# Patient Record
Sex: Male | Born: 1940 | Race: White | Hispanic: No | Marital: Married | Smoking: Former smoker
Health system: Southern US, Community
[De-identification: ages and names within clinical notes are randomized; demographics above are authoritative.]

## PROBLEM LIST (undated history)

## (undated) ENCOUNTER — Inpatient Hospital Stay: Admission: EM | Payer: Self-pay | Source: Home / Self Care

## (undated) DIAGNOSIS — R233 Spontaneous ecchymoses: Secondary | ICD-10-CM

## (undated) DIAGNOSIS — C801 Malignant (primary) neoplasm, unspecified: Secondary | ICD-10-CM

## (undated) DIAGNOSIS — R011 Cardiac murmur, unspecified: Secondary | ICD-10-CM

## (undated) DIAGNOSIS — E785 Hyperlipidemia, unspecified: Secondary | ICD-10-CM

## (undated) DIAGNOSIS — M1990 Unspecified osteoarthritis, unspecified site: Secondary | ICD-10-CM

## (undated) DIAGNOSIS — E039 Hypothyroidism, unspecified: Secondary | ICD-10-CM

## (undated) DIAGNOSIS — R42 Dizziness and giddiness: Secondary | ICD-10-CM

## (undated) DIAGNOSIS — R339 Retention of urine, unspecified: Secondary | ICD-10-CM

## (undated) DIAGNOSIS — H409 Unspecified glaucoma: Secondary | ICD-10-CM

## (undated) DIAGNOSIS — R404 Transient alteration of awareness: Secondary | ICD-10-CM

## (undated) DIAGNOSIS — N400 Enlarged prostate without lower urinary tract symptoms: Secondary | ICD-10-CM

## (undated) DIAGNOSIS — I10 Essential (primary) hypertension: Secondary | ICD-10-CM

## (undated) DIAGNOSIS — R238 Other skin changes: Secondary | ICD-10-CM

## (undated) DIAGNOSIS — Z8719 Personal history of other diseases of the digestive system: Secondary | ICD-10-CM

## (undated) DIAGNOSIS — K219 Gastro-esophageal reflux disease without esophagitis: Secondary | ICD-10-CM

## (undated) DIAGNOSIS — T8859XA Other complications of anesthesia, initial encounter: Secondary | ICD-10-CM

## (undated) DIAGNOSIS — F419 Anxiety disorder, unspecified: Secondary | ICD-10-CM

## (undated) DIAGNOSIS — T4145XA Adverse effect of unspecified anesthetic, initial encounter: Secondary | ICD-10-CM

## (undated) HISTORY — PX: EYE SURGERY: SHX253

## (undated) HISTORY — PX: ROTATOR CUFF REPAIR: SHX139

## (undated) HISTORY — PX: HERNIA REPAIR: SHX51

## (undated) HISTORY — PX: TONSILLECTOMY: SUR1361

## (undated) HISTORY — PX: BACK SURGERY: SHX140

## (undated) HISTORY — PX: COLONOSCOPY W/ POLYPECTOMY: SHX1380

---

## 1987-05-05 HISTORY — PX: RADICAL NECK DISSECTION: SHX2284

## 1999-02-28 ENCOUNTER — Other Ambulatory Visit: Admission: RE | Admit: 1999-02-28 | Discharge: 1999-02-28 | Payer: Self-pay | Admitting: Urology

## 2001-02-11 ENCOUNTER — Encounter (INDEPENDENT_AMBULATORY_CARE_PROVIDER_SITE_OTHER): Payer: Self-pay | Admitting: Specialist

## 2001-02-11 ENCOUNTER — Inpatient Hospital Stay (HOSPITAL_COMMUNITY): Admission: RE | Admit: 2001-02-11 | Discharge: 2001-02-14 | Payer: Self-pay | Admitting: Urology

## 2007-04-26 ENCOUNTER — Ambulatory Visit (HOSPITAL_COMMUNITY): Admission: RE | Admit: 2007-04-26 | Discharge: 2007-04-28 | Payer: Self-pay | Admitting: General Surgery

## 2008-10-17 ENCOUNTER — Ambulatory Visit (HOSPITAL_COMMUNITY): Admission: RE | Admit: 2008-10-17 | Discharge: 2008-10-18 | Payer: Self-pay | Admitting: Specialist

## 2010-05-08 LAB — COMPREHENSIVE METABOLIC PANEL
ALT: 17 U/L (ref 0–53)
AST: 18 U/L (ref 0–37)
Albumin: 3.8 g/dL (ref 3.5–5.2)
Alkaline Phosphatase: 58 U/L (ref 39–117)
BUN: 11 mg/dL (ref 6–23)
CO2: 29 mEq/L (ref 19–32)
Calcium: 9.7 mg/dL (ref 8.4–10.5)
Chloride: 104 mEq/L (ref 96–112)
Creatinine, Ser: 0.94 mg/dL (ref 0.4–1.5)
GFR calc Af Amer: >60 mL/min (ref >60)
GFR calc non Af Amer: >60 mL/min (ref >60)
Glucose, Bld: 95 mg/dL (ref 70–99)
Potassium: 4 mEq/L (ref 3.5–5.1)
Sodium: 140 mEq/L (ref 135–145)
Total Bilirubin: 0.7 mg/dL (ref 0.3–1.2)
Total Protein: 6.6 g/dL (ref 6.0–8.3)

## 2010-05-08 LAB — CBC
HCT: 41.5 % (ref 39.0–52.0)
Hemoglobin: 14.3 g/dL (ref 13.0–17.0)
MCH: 32.2 pg (ref 26.0–34.0)
MCHC: 34.5 g/dL (ref 30.0–36.0)
MCV: 93.5 fL (ref 78.0–100.0)
Platelets: 203 10*3/uL (ref 150–400)
RBC: 4.44 MIL/uL (ref 4.22–5.81)
RDW: 11.9 % (ref 11.5–15.5)
WBC: 6 10*3/uL (ref 4.0–10.5)

## 2010-05-15 ENCOUNTER — Ambulatory Visit (HOSPITAL_COMMUNITY)
Admission: RE | Admit: 2010-05-15 | Discharge: 2010-05-15 | Payer: Self-pay | Source: Home / Self Care | Attending: Specialist | Admitting: Specialist

## 2010-05-20 ENCOUNTER — Encounter
Admission: RE | Admit: 2010-05-20 | Discharge: 2010-06-03 | Payer: Self-pay | Source: Home / Self Care | Attending: Specialist | Admitting: Specialist

## 2010-05-29 LAB — SURGICAL PCR SCREEN
MRSA, PCR: NEGATIVE
Staphylococcus aureus: NEGATIVE

## 2010-08-11 LAB — CBC
HCT: 43.2 % (ref 39.0–52.0)
Hemoglobin: 14.8 g/dL (ref 13.0–17.0)
MCHC: 34.2 g/dL (ref 30.0–36.0)
MCV: 96.7 fL (ref 78.0–100.0)
Platelets: 195 10*3/uL (ref 150–400)
RBC: 4.47 MIL/uL (ref 4.22–5.81)
RDW: 12.6 % (ref 11.5–15.5)
WBC: 6.2 10*3/uL (ref 4.0–10.5)

## 2010-08-11 LAB — COMPREHENSIVE METABOLIC PANEL
ALT: 15 U/L (ref 0–53)
AST: 18 U/L (ref 0–37)
Albumin: 4 g/dL (ref 3.5–5.2)
Alkaline Phosphatase: 63 U/L (ref 39–117)
BUN: 14 mg/dL (ref 6–23)
CO2: 34 mEq/L — ABNORMAL HIGH (ref 19–32)
Calcium: 9.9 mg/dL (ref 8.4–10.5)
Chloride: 101 mEq/L (ref 96–112)
Creatinine, Ser: 1.1 mg/dL (ref 0.4–1.5)
GFR calc Af Amer: >60 mL/min (ref >60)
GFR calc non Af Amer: >60 mL/min (ref >60)
Glucose, Bld: 103 mg/dL — ABNORMAL HIGH (ref 70–99)
Potassium: 5.1 mEq/L (ref 3.5–5.1)
Sodium: 140 mEq/L (ref 135–145)
Total Bilirubin: 0.7 mg/dL (ref 0.3–1.2)
Total Protein: 6.5 g/dL (ref 6.0–8.3)

## 2010-08-11 LAB — URINALYSIS, ROUTINE W REFLEX MICROSCOPIC
Bilirubin Urine: NEGATIVE
Glucose, UA: NEGATIVE mg/dL
Hgb urine dipstick: NEGATIVE
Ketones, ur: NEGATIVE mg/dL
Nitrite: NEGATIVE
Protein, ur: NEGATIVE mg/dL
Specific Gravity, Urine: 1.008 (ref 1.005–1.030)
Urobilinogen, UA: 0.2 mg/dL (ref 0.0–1.0)
pH: 7.5 (ref 5.0–8.0)

## 2010-08-11 LAB — PROTIME-INR
INR: 1 (ref 0.00–1.49)
Prothrombin Time: 12.9 seconds (ref 11.6–15.2)

## 2010-08-11 LAB — GLUCOSE, CAPILLARY: Glucose-Capillary: 175 mg/dL — ABNORMAL HIGH (ref 70–99)

## 2010-08-11 LAB — APTT: aPTT: 29 seconds (ref 24–37)

## 2010-09-16 NOTE — Op Note (Signed)
[name], [name]             ACCOUNT NO.:  [id number]   MEDICAL RECORD NO.:  [id number]          PATIENT TYPE:  AMB   LOCATION:  SDS                          FACILITY:  [facility]   PHYSICIAN:  Cherylynn Ridges, M.D.    DATE OF BIRTH:  1940/12/22   DATE OF PROCEDURE:  04/26/2007  DATE OF DISCHARGE:                               OPERATIVE REPORT   PREOPERATIVE DIAGNOSIS:  Bilateral inguinal and umbilical hernia.   POSTOPERATIVE DIAGNOSIS:  Bilateral direct inguinal hernias with  umbilical hernia.   PROCEDURE:  Bilateral inguinal hernia repair with mesh and primary  umbilical hernia repair without mesh.   SURGEON:  Dr. Lindie Spruce.   ANESTHESIA:  General endotracheal.   ESTIMATED BLOOD LOSS:  Less than 50 mL.   COMPLICATIONS:  None.   CONDITION:  Stable.  No specimens were sent.   INDICATIONS FOR OPERATION:  The patient is a 70 year old gentleman who  was seen in the office, found to have bilateral inguinal hernias.  Although he was sent over for initial evaluation of a single-sided  inguinal hernia primarily on the left, he was found to have bilateral  inguinal hernias and an umbilical hernia on exam.  He comes in now for  elective repair.   FINDINGS:  The patient's hernias in the inguinal area are both of large  direct hernias.  The umbilical hernia was not incarcerated but was about  4 cm in maximal diameter.   OPERATION:  The patient was taken to the operating room, placed on table  in supine position.  After an adequate endotracheal anesthetic was  administered, he was prepped and draped in usual sterile manner exposing  the entire abdomen down to the pubis.   The first hernia repair was the one on the left side, and it was done  the same as the one on the right.  The operative report is identical to  that on the left side with a few exceptions.  A transverse curvilinear  incision was made with a #10 blade and taken down into the subcutaneous  tissue.  It measured  approximately 7 cm long.  We dissected through the  subcutaneous tissue down to external oblique aponeurosis which was then  dissected free down to sufficient superficial ring.  We split the  external oblique fibers through the superficial ring and then exposed  and mobilized the spermatic cord using a Penrose drain.  It was obvious  that the patient had a large direct sac which was pushing on the lateral  superior aspect of the spermatic cord.  We dissected away the hernia sac  away from the spermatic cord, mobilized the spermatic cord, and then  subsequently sort of folded and sutured the direct sac on itself,  keeping it held down below the inguinal floor using 0 Ethibond sutures.  Once this was done, we reinforced the floor using an oval piece of mesh  measuring 3 x 6 cm in size attaching it to the pubic tubercle with a 0  Prolene suture and anteromedially to the conjoined tendon and  inferolaterally to the reflected portion of the inguinal ligament.  We  sutured all the way out to the internal ring where we tied it upon  itself.  Excess mesh was cut off.  We had soaked it in antibiotic  solution, and then we irrigated with antibiotic solution.  There was  minimal bleeding.  Once we had the mesh in place, we allowed the  spermatic cord to fall back in place along the ilioinguinal nerve which  was preserved.  We made sure not to entrap it with our closure.  We did  close the external oblique fascia on top of the cord using running 3-0  Vicryl stitch, again taking care not to infringe upon or suture the  ilioinguinal nerve.  Once this was done, we closed Scarpa's fascia using  interrupted 3-0 Vicryl suture.  Then, the skin was closed using a  running subcuticular stitch of 4-0 Monocryl.  We injected half percent  Marcaine without epinephrine into the wound prior to closure.   The right side was repaired in a very similar manner.  The only  difference was that only 6 sutures were used to  suture the indirect sac  on itself prior to placing in the oval piece of mesh.  It was injected  with Marcaine and closed in a similar manner.  Sterile dressing was  applied including Dermabond and Steri-Strips and a Tegaderm.   The periumbilical ventral hernia was repaired through a curvilinear  incision horizontally to left on the umbilicus.  We dissected out the  hernia sac to its neck at the fascia where we cut into the sac, reduced  the omentum, ligated the piece of omentum using a 2-0 Vicryl tie and  resected the excess portion.  Once we had placed all of its contents  back into the peritoneal cavity, we used Kocher clamps to tent up on the  fascia.  Then, we closed it primarily using interrupted #1 Novofil  sutures.  After this was done, we ran a stitch back and forth across the  interrupted repair using running #1 Novofil, bringing it from the left  side to the right and then back to the left and tying it to itself.  No  mesh was used.  We irrigated with saline.  Then, we closed with 3-0  Vicryl in the deep layer, making sure to invert the umbilicus, and the  skin was closed using running subcuticular stitch of 4-0 Monocryl.  We  injected the area with Marcaine 0.50% without epinephrine also.  We  applied Steri-Strips, Dermabond and OpSite to that area also.  All  needle, sponge counts and instrument counts were correct at the  conclusion of case.      Cherylynn Ridges, M.D.  Electronically Signed     JOW/MEDQ  D:  04/26/2007  T:  04/27/2007  Job:  [id number]

## 2010-09-16 NOTE — Op Note (Signed)
NAMEFIONN, STRACKE             ACCOUNT NO.:  [id number]   MEDICAL RECORD NO.:  [id number]          PATIENT TYPE:  AMB   LOCATION:  DAY                          FACILITY:  [facility]   PHYSICIAN:  Jene Every, M.D.    DATE OF BIRTH:  12/20/1940   DATE OF PROCEDURE:  10/17/2008  DATE OF DISCHARGE:                               OPERATIVE REPORT   PREOPERATIVE DIAGNOSIS:  Spinal stenosis at L4-5.   POSTOPERATIVE DIAGNOSIS:  Spinal stenosis at L4-5.   PROCEDURE PERFORMED:  1. Bilateral lateral recess decompression foraminotomies of L5 and      decompression lateral recess 4-5.  2. Central laminectomy L4-5.   ANESTHESIA:  General.   ASSISTANT:  Roma Schanz, P.A.   BRIEF HISTORY:  A 70 year old with left lower extremity radicular pain  secondary to multifactorial stenosis.  He is indicated for  decompression, been refractory to conservative treatment.  Risks and  benefits discussed including bleeding, infection damage to neurovascular  structures, no change in symptoms, worsening symptoms, need for repeat  decompression, fusion etc.   TECHNIQUE:  The patient in supine position on operating table after  adequate anesthesia, 1 gram of Kefzol, was placed prone on the Elida  frame.  All bony prominences well padded.  Lumbar region prepped and  draped in the usual sterile fashion.  Two 18 gauge spinal needles  utilized to localize 4-5 interspace confirmed with x-ray.  Incision was  made over spinous process 4 to 5,subcutaneous tissue was dissected.  Electrocautery utilized to achieve hemostasis.  Dorsolumbar fascia  identified, divided in line with skin incision.  Paraspinous muscle  elevated from __________ 4 and 5. McCullough retractor was placed.  Operating microscope was draped, brought on the surgical field after x-  ray confirmed the interlaminar space 4-5 with a lateral x-ray.  Very  small interlaminar window was noted and therefore we used a central  decompression to  preserve the facets.  Partial removal spinous process  of 45 was performed with Leksell rongeur.  Hemilaminotomy of the caudad  edge of 4 was performed with a 2 mm Kerrison and the cephalad edge of 5  as well after detaching ligamentum flavum with straight curette.  Neural  patties were placed beneath the lamina to protect neural elements at all  times.  Ligamentum flavum which was noted be hypertrophic was noted  severe stenosis, left greater than right.  Residual of epidural  injections were noted on the left as well.  We decompressed the lateral  recess to the medial border of pedicle bilaterally and performed  foraminotomies of L5 bilaterally, freeing the L5 nerve root.  We then  passed a hockey stick probe cephalad and caudad to the foramen of 5  being widely patent out the foramen of 4 widely patent.  Nerve root had  at least a centimeter of excursion medial to pedicle without difficulty  bilaterally.  Disk was examined bilaterally.  No disk herniation was  noted.  Wound copiously irrigated.  Inspection revealed no CSF leaks or  active bleeding.  Again pathology noted significantly left greater than  right, predominantly stenosis multifactorial.  We preserved the pars.  Next thrombin-soaked Gelfoam was placed in laminotomy defect.  McCullough retractors removed, irrigated.  Paraspinous muscles inspected  with no active bleeding.  We repaired the dorsolumbar fascia with #1  Vicryl interrupted figure-of-eight sutures, subcu with 2-0 Vicryl simple  sutures.  Skin was reapproximated with staples.  Wound was dressed  sterilely.  Placed supine on hospital bed, extubated without difficulty  transported to the recovery room in satisfactory addition.   The patient tolerated the procedure well with no complications.   Minimal blood loss.      Jene Every, M.D.  Electronically Signed     JB/MEDQ  D:  10/17/2008  T:  10/17/2008  Job:  [id number]

## 2010-09-19 NOTE — H&P (Signed)
[facility]  Patient:    [name], [name] Visit Number: [id number] MRN: [id number]          Service Type: SUR Location: 3W 0351 01 Attending Physician:  Evlyn Clines Dictated by:   Excell Seltzer. Annabell Howells, M.D. Admit Date:  02/11/2001 Discharge Date: 02/14/2001                           History and Physical  CHIEF COMPLAINT:  I cannot pee.  HISTORY OF PRESENT ILLNESS:  [name] is a 70 year old white male with a history of BPH with outlet obstruction, who underwent recent right shoulder surgery and developed postoperative retention and has failed several voiding trials.  He presents now for TURP.  ALLERGIES:  No known drug allergies.  CURRENT MEDICATIONS: 1. Cipro 500 mg 2 x q.d. 2. Nexium 1 p.o. q.a.m. 3. Plendil 10 mg q.d. 4. Synthroid q.d. 5. Flomax 0.4 mg q.d. 6. Darvocet p.r.n.  PAST MEDICAL HISTORY: 1. Hypertension. 2. Mitral valve prolapse. 3. Hypothyroidism. 4. Gastroesophageal reflux disease. 5. History of throat cancer. 6. History of BPH.  PAST SURGICAL HISTORY: 1. Recent right rotator cuff repair. 2. Prior neck resection; removal of neuroma from right front. 3. Blepharoplasty. 4. Chemotherapy and radiation therapy with throat cancer surgery.  SOCIAL HISTORY:  Former smoker.  He drinks occasional beer.  FAMILY HISTORY:  Unremarkable.  REVIEW OF SYSTEMS:  He has some irritation from the catheter.  He has some pain in right shoulder.  He is otherwise without complaints.  PHYSICAL EXAMINATION:  VITAL SIGNS:  Temperature 98.1, pulse 96, respirations 16, and blood pressure 140/70.  GENERAL:  Well-developed, well-nourished in no acute distress.  Alert and oriented x 3.  HEENT:  Normocephalic, atraumatic.  NECK:  Reveals tissue from left neck dissection.  LUNGS:  Clear to auscultation with normal effort.  HEART:  Regular rate and rhythm.  ABDOMEN:  Soft, flat, and nontender without masses or lesions, CVA  tenderness, or hepatosplenomegaly.  No hernias noted.  GU:  Unremarkable phallus with Foley indwelling.  Scrotum unremarkable and testicles bilaterally descended and normal in size and consistency without masses or tenderness.  Epididymides are unremarkable.  No inguinal hernias noted.  Anus and perineum without lesions.  RECTAL:  Normal sphincter tone.  Prostate is 2-3+ in size and benign in consistency without nodules.  Seminal vesicles are not palpable and no rectal masses are noted.  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  PLAN:  TURP. Dictated by:   Excell Seltzer. Annabell Howells, M.D. Attending Physician:  Evlyn Clines DD:  03/08/01 TD:  03/09/01 Job: [id number]

## 2010-09-19 NOTE — Op Note (Signed)
[facility]  Patient:    Derek, Ballard Visit Number: [id number] MRN: [id number]          Service Type: SUR Location: 3W 0351 01 Attending Physician:  Evlyn Clines Proc. Date: 02/11/01 Admit Date:  02/11/2001                             Operative Report  PROCEDURE:  Transurethral resection of the prostate.  PREOPERATIVE DIAGNOSIS:  Benign prostatic hypertrophy with retention.  POSTOPERATIVE DIAGNOSIS:  Benign prostatic hypertrophy with retention.  SURGEON:  Excell Seltzer. Annabell Howells, M.D.  ANESTHESIA:  General.  DRAIN:  22 French three-way Foley catheter.  SPECIMEN:  Prostate tissue.  COMPLICATIONS:  None.  INDICATION:  [name] is a 71 year old white male with a history of BPH with outlet obstruction, who developed retention following right shoulder surgery. He has failed to respond to voiding trials despite maximal Flomax and Urecholine.  He has cystoscopic evidence of obstruction and has elected to undergo TURP.  FINDINGS AT PROCEDURE:  The patient was given p.o. Tequin.  He was taken to the operating room where a general anesthetic was induced.  He was placed in lithotomy position.  His perineum and genitalia were prepped with Betadine solution.  He was draped in the usual sterile fashion.  The 28 French continuous-flow resectoscope sheath was placed without difficulty.  This was fitted with an [place] handle, a 12 degree lens, and a 24 loop.  Resection was initiated at the bladder neck.  The fibers were exposed from 5 to 7 oclock.  The floor of the prostate was resected out alongside the verumontanum.  The right lateral lobe was then resected from bladder neck to apex.  The left lobe was then resected in identical fashion, and the bladder was evacuated free of chips.  Hemostasis was achieved.  He was noted to have some residual apical tissue that was obstructing.  This was resected.  Those chips were removed.  Hemostasis was achieved.   Final inspection revealed no retained chips.  No active bleeding.  The ureteral orifices were intact.  The external sphincter was intact.  The scope was removed.  Pressure on the bladder produced an excellent stream.  A 22 French three-way Foley catheter was inserted with the aide of a catheter guide.  The balloon was filled with 30 cc of sterile fluid.  The catheter was placed on continuous irrigation and was hand-irrigated until clear.  It was then placed on straight drainage.  The specimen was sent for pathology.  He was taken down from lithotomy position. The patients anesthetic was reversed.  He was moved to the recovery room in stable condition.  There were no complications. Attending Physician:  Evlyn Clines DD:  02/11/01 TD:  02/11/01 Job: [id number] [id number]

## 2010-09-19 NOTE — Discharge Summary (Signed)
[facility]  Patient:    [name], [name] Visit Number: [id number] MRN: [id number]          Service Type: SUR Location: 3W 0351 01 Attending Physician:  Evlyn Clines Dictated by:   Excell Seltzer. Annabell Howells, M.D. Admit Date:  02/11/2001 Discharge Date: 02/14/2001                             Discharge Summary  HISTORY OF PRESENT ILLNESS:  Briefly, Mr. [name] is a 70 year old white male who has a history of BPH with outlet obstruction and developed urinary retention following a recent right shoulder surgery.  He has failed several voiding trials and is not without undergo TURP.  For additional details of the history and physical, please see the health history assessment sheet.  HOSPITAL COURSE:  On the day of admission, the patient was taken to the operating room where he underwent a transurethral resection of the prostate. He was left with a 22 [place] three-way Foley catheter.  He tolerated the procedure well.  Postoperatively, he did well.  On the first postoperative day, he was having some bladder spasms, but his urine was clear.  On the second postoperative day, he had a maximum temperature of 100.4 degrees, the Foley was out, and the patient was unable to void.  A 20 French coude catheter was reinserted with return of 570 cc of urine.  On the third postoperative day, he was having some catheter irritation, but he was felt to be ready for discharge home.  His final pathology returned consistent with BPH.  His final diagnosis is urinary retention with a hypotonic bladder.  His admission was complicated by postoperative urinary retention.  DISCHARGE MEDICATIONS: 1. Cipro 500 mg p.o. b.i.d. 2. Urecholine 25 mg q.i.d.  FOLLOW-UP:  He was instructed to follow up with me in the office on February 16, 2001, for another voiding trial.  PROGNOSIS:  Fair.  DISPOSITION:  To home.  CONDITION ON DISCHARGE:  Stable. Dictated by:   Excell Seltzer. Annabell Howells,  M.D. Attending Physician:  Evlyn Clines DD:  03/08/01 TD:  03/09/01 Job: [id number]

## 2010-10-07 ENCOUNTER — Other Ambulatory Visit: Payer: Self-pay | Admitting: Urology

## 2010-10-07 DIAGNOSIS — N281 Cyst of kidney, acquired: Secondary | ICD-10-CM

## 2010-10-13 ENCOUNTER — Ambulatory Visit (HOSPITAL_COMMUNITY)
Admission: RE | Admit: 2010-10-13 | Discharge: 2010-10-13 | Disposition: A | Discharge status: Home / Self Care | Payer: Medicare Other | Source: Ambulatory Visit | Attending: Urology | Admitting: Urology

## 2010-10-13 DIAGNOSIS — N281 Cyst of kidney, acquired: Secondary | ICD-10-CM

## 2010-10-13 DIAGNOSIS — I517 Cardiomegaly: Secondary | ICD-10-CM | POA: Insufficient documentation

## 2010-10-13 DIAGNOSIS — Q619 Cystic kidney disease, unspecified: Secondary | ICD-10-CM | POA: Insufficient documentation

## 2010-10-13 MED ORDER — GADOBENATE DIMEGLUMINE 529 MG/ML IV SOLN
20.0000 mL | Freq: Once | INTRAVENOUS | Status: AC | PRN
  Administered 2010-10-13: 20 mL via INTRAVENOUS

## 2011-02-06 LAB — DIFFERENTIAL
Basophils Absolute: 0
Basophils Relative: 1
Eosinophils Absolute: 0 — ABNORMAL LOW
Eosinophils Relative: 0
Lymphocytes Relative: 11 — ABNORMAL LOW
Lymphs Abs: 0.8
Monocytes Absolute: 0.5
Monocytes Relative: 6
Neutro Abs: 6.3
Neutrophils Relative %: 82 — ABNORMAL HIGH

## 2011-02-06 LAB — COMPREHENSIVE METABOLIC PANEL
ALT: 23
AST: 19
Albumin: 3.9
Alkaline Phosphatase: 61
BUN: 13
CO2: 29
Calcium: 9.4
Chloride: 101
Creatinine, Ser: 0.96
GFR calc Af Amer: >60
GFR calc non Af Amer: >60
Glucose, Bld: 103 — ABNORMAL HIGH
Potassium: 4.5
Sodium: 136
Total Bilirubin: 0.9
Total Protein: 6

## 2011-02-06 LAB — CBC
HCT: 40.9
Hemoglobin: 14.1
MCHC: 34.6
MCV: 95
Platelets: 201
RBC: 4.3
RDW: 12.7
WBC: 7.7

## 2011-08-11 ENCOUNTER — Other Ambulatory Visit: Payer: Self-pay | Admitting: Specialist

## 2011-08-11 DIAGNOSIS — M48061 Spinal stenosis, lumbar region without neurogenic claudication: Secondary | ICD-10-CM

## 2011-08-19 ENCOUNTER — Ambulatory Visit
Admission: RE | Admit: 2011-08-19 | Discharge: 2011-08-19 | Disposition: A | Discharge status: Home / Self Care | Payer: PRIVATE HEALTH INSURANCE | Source: Ambulatory Visit | Attending: Specialist | Admitting: Specialist

## 2011-08-19 VITALS — BP 139/84 | HR 64

## 2011-08-19 DIAGNOSIS — M48061 Spinal stenosis, lumbar region without neurogenic claudication: Secondary | ICD-10-CM

## 2011-08-19 MED ORDER — IOHEXOL 180 MG/ML  SOLN
15.0000 mL | Freq: Once | INTRAMUSCULAR | Status: AC | PRN
  Administered 2011-08-19: 15 mL via INTRAVENOUS

## 2011-08-19 MED ORDER — DIAZEPAM 5 MG PO TABS: 5.0000 mg | ORAL_TABLET | Freq: Once | ORAL | Status: DC

## 2011-08-19 NOTE — Discharge Instructions (Signed)
Myelogram Discharge Instructions  1. Go home and rest quietly for the next 24 hours.  It is important to lie flat for the next 24 hours.  Get up only to go to the restroom.  You may lie in the bed or on a couch on your back, your stomach, your left side or your right side.  You may have one pillow under your head.  You may have pillows between your knees while you are on your side or under your knees while you are on your back.  2. DO NOT drive today.  Recline the seat as far back as it will go, while still wearing your seat belt, on the way home.  3. You may get up to go to the bathroom as needed.  You may sit up for 10 minutes to eat.  You may resume your normal diet and medications unless otherwise indicated.  Drink lots of extra fluids today and tomorrow.  4. The incidence of headache, nausea, or vomiting is about 5% (one in 20 patients).  If you develop a headache, lie flat and drink plenty of fluids until the headache goes away.  Caffeinated beverages may be helpful.  If you develop severe nausea and vomiting or a headache that does not go away with flat bed rest, call [phone].  5. You may resume normal activities after your 24 hours of bed rest is over; however, do not exert yourself strongly or do any heavy lifting tomorrow. If when you get up you have a headache when standing, go back to bed and force fluids for another 24 hours.  6. Call your physician for a follow-up appointment.  The results of your myelogram will be sent directly to your physician by the following day.  7. If you have any questions or if complications develop after you arrive home, please call [phone].  Discharge instructions have been explained to the patient.  The patient, or the person responsible for the patient, fully understands these instructions.       May resume lexapro August 20, 2011, after 9:30 am.

## 2011-08-19 NOTE — Progress Notes (Signed)
Pt states he's been off of lexapro for the past 2 day. dd

## 2012-02-04 ENCOUNTER — Other Ambulatory Visit: Payer: Self-pay | Admitting: Neurosurgery

## 2012-02-25 ENCOUNTER — Encounter (HOSPITAL_COMMUNITY): Payer: Self-pay | Admitting: Pharmacy Technician

## 2012-03-03 NOTE — Pre-Procedure Instructions (Signed)
20 [name]  03/03/2012   Your procedure is scheduled on:  Fri, Nov 8 @ 7:30 AM  Report to [facility] at 5:30 AM.  Call this number if you have problems the morning of surgery: [phone]   Remember:   Do not eat food:After Midnight.    Take these medicines the morning of surgery with A SIP OF WATER: Omeprazole(Prilosec),Synthroid(Levothyroxine),Escitalopram(Lexapro),and Eye Drops   Do not wear jewelry  Do not wear lotions, powders, or colognes. You may wear deodorant.  Men may shave face and neck.  Do not bring valuables to the hospital.  Contacts, dentures or bridgework may not be worn into surgery.  Leave suitcase in the car. After surgery it may be brought to your room.  For patients admitted to the hospital, checkout time is 11:00 AM the day of discharge.   Patients discharged the day of surgery will not be allowed to drive home.    Special Instructions: Shower using CHG 2 nights before surgery and the night before surgery.  If you shower the day of surgery use CHG.  Use special wash - you have one bottle of CHG for all showers.  You should use approximately 1/3 of the bottle for each shower.   Please read over the following fact sheets that you were given: Pain Booklet, Coughing and Deep Breathing, Blood Transfusion Information, MRSA Information and Surgical Site Infection Prevention

## 2012-03-04 ENCOUNTER — Encounter (HOSPITAL_COMMUNITY): Payer: Self-pay

## 2012-03-04 ENCOUNTER — Encounter (HOSPITAL_COMMUNITY)
Admission: RE | Admit: 2012-03-04 | Discharge: 2012-03-04 | Disposition: A | Discharge status: Home / Self Care | Payer: Medicare Other | Source: Ambulatory Visit | Attending: Neurosurgery | Admitting: Neurosurgery

## 2012-03-04 HISTORY — DX: Dizziness and giddiness: R42

## 2012-03-04 HISTORY — DX: Cardiac murmur, unspecified: R01.1

## 2012-03-04 HISTORY — DX: Hypothyroidism, unspecified: E03.9

## 2012-03-04 HISTORY — DX: Personal history of other diseases of the digestive system: Z87.19

## 2012-03-04 HISTORY — DX: Unspecified glaucoma: H40.9

## 2012-03-04 HISTORY — DX: Benign prostatic hyperplasia without lower urinary tract symptoms: N40.0

## 2012-03-04 HISTORY — DX: Anxiety disorder, unspecified: F41.9

## 2012-03-04 HISTORY — DX: Retention of urine, unspecified: R33.9

## 2012-03-04 HISTORY — DX: Hyperlipidemia, unspecified: E78.5

## 2012-03-04 HISTORY — DX: Gastro-esophageal reflux disease without esophagitis: K21.9

## 2012-03-04 HISTORY — DX: Malignant (primary) neoplasm, unspecified: C80.1

## 2012-03-04 HISTORY — DX: Essential (primary) hypertension: I10

## 2012-03-04 LAB — SURGICAL PCR SCREEN
MRSA, PCR: NEGATIVE
Staphylococcus aureus: NEGATIVE

## 2012-03-04 LAB — BASIC METABOLIC PANEL
BUN: 15 mg/dL (ref 6–23)
CO2: 30 mEq/L (ref 19–32)
Chloride: 104 mEq/L (ref 96–112)
Creatinine, Ser: 0.92 mg/dL (ref 0.50–1.35)
GFR calc Af Amer: >90 mL/min (ref >90)
Glucose, Bld: 105 mg/dL — ABNORMAL HIGH (ref 70–99)
Potassium: 4.3 mEq/L (ref 3.5–5.1)

## 2012-03-04 LAB — CBC WITH DIFFERENTIAL/PLATELET
Basophils Relative: 1 % (ref 0–1)
Eosinophils Relative: 2 % (ref 0–5)
HCT: 42.1 % (ref 39.0–52.0)
Hemoglobin: 14.5 g/dL (ref 13.0–17.0)
MCHC: 34.4 g/dL (ref 30.0–36.0)
MCV: 92.5 fL (ref 78.0–100.0)
Monocytes Absolute: 0.4 10*3/uL (ref 0.1–1.0)
Monocytes Relative: 9 % (ref 3–12)
Neutro Abs: 2.7 10*3/uL (ref 1.7–7.7)
RDW: 12 % (ref 11.5–15.5)

## 2012-03-04 LAB — ABO/RH: ABO/RH(D): O NEG

## 2012-03-04 LAB — TYPE AND SCREEN

## 2012-03-04 NOTE — Progress Notes (Signed)
Primary Physician - Dr. Patsey Berthold - [facility] Does not have a cardiologist. Has not had any type of cardiac testing.

## 2012-03-10 MED ORDER — CEFAZOLIN SODIUM-DEXTROSE 2-3 GM-% IV SOLR
2.0000 g | INTRAVENOUS | Status: AC
  Administered 2012-03-11: 2 g via INTRAVENOUS
  Filled 2012-03-10 (×2): qty 50

## 2012-03-11 ENCOUNTER — Inpatient Hospital Stay (HOSPITAL_COMMUNITY)
Admission: RE | Admit: 2012-03-11 | Discharge: 2012-03-15 | DRG: 460 | Disposition: A | Discharge status: Home / Self Care | Payer: Medicare Other | Source: Ambulatory Visit | Attending: Neurosurgery | Admitting: Neurosurgery

## 2012-03-11 ENCOUNTER — Encounter (HOSPITAL_COMMUNITY): Payer: Self-pay | Admitting: Neurosurgery

## 2012-03-11 ENCOUNTER — Encounter (HOSPITAL_COMMUNITY): Payer: Self-pay | Admitting: *Deleted

## 2012-03-11 ENCOUNTER — Inpatient Hospital Stay (HOSPITAL_COMMUNITY): Payer: Medicare Other

## 2012-03-11 ENCOUNTER — Inpatient Hospital Stay (HOSPITAL_COMMUNITY): Payer: Medicare Other | Admitting: Anesthesiology

## 2012-03-11 ENCOUNTER — Encounter (HOSPITAL_COMMUNITY)
Admission: RE | Disposition: A | Discharge status: Home / Self Care | Payer: Self-pay | Source: Ambulatory Visit | Attending: Neurosurgery

## 2012-03-11 ENCOUNTER — Encounter (HOSPITAL_COMMUNITY): Payer: Self-pay | Admitting: Anesthesiology

## 2012-03-11 DIAGNOSIS — Z87891 Personal history of nicotine dependence: Secondary | ICD-10-CM

## 2012-03-11 DIAGNOSIS — E785 Hyperlipidemia, unspecified: Secondary | ICD-10-CM | POA: Diagnosis present

## 2012-03-11 DIAGNOSIS — F411 Generalized anxiety disorder: Secondary | ICD-10-CM | POA: Diagnosis present

## 2012-03-11 DIAGNOSIS — H409 Unspecified glaucoma: Secondary | ICD-10-CM | POA: Diagnosis present

## 2012-03-11 DIAGNOSIS — I10 Essential (primary) hypertension: Secondary | ICD-10-CM | POA: Diagnosis present

## 2012-03-11 DIAGNOSIS — E039 Hypothyroidism, unspecified: Secondary | ICD-10-CM | POA: Diagnosis present

## 2012-03-11 DIAGNOSIS — Z79899 Other long term (current) drug therapy: Secondary | ICD-10-CM

## 2012-03-11 DIAGNOSIS — M48062 Spinal stenosis, lumbar region with neurogenic claudication: Principal | ICD-10-CM | POA: Diagnosis present

## 2012-03-11 DIAGNOSIS — M51379 Other intervertebral disc degeneration, lumbosacral region without mention of lumbar back pain or lower extremity pain: Secondary | ICD-10-CM | POA: Diagnosis present

## 2012-03-11 DIAGNOSIS — Z85819 Personal history of malignant neoplasm of unspecified site of lip, oral cavity, and pharynx: Secondary | ICD-10-CM

## 2012-03-11 DIAGNOSIS — M5137 Other intervertebral disc degeneration, lumbosacral region: Secondary | ICD-10-CM | POA: Diagnosis present

## 2012-03-11 DIAGNOSIS — K219 Gastro-esophageal reflux disease without esophagitis: Secondary | ICD-10-CM | POA: Diagnosis present

## 2012-03-11 DIAGNOSIS — N400 Enlarged prostate without lower urinary tract symptoms: Secondary | ICD-10-CM | POA: Diagnosis present

## 2012-03-11 HISTORY — PX: LUMBAR PERCUTANEOUS PEDICLE SCREW 1 LEVEL: SHX5560

## 2012-03-11 HISTORY — PX: ANTERIOR LAT LUMBAR FUSION: SHX1168

## 2012-03-11 SURGERY — ANTERIOR LATERAL LUMBAR FUSION 1 LEVEL
Anesthesia: General | Site: Back | Laterality: Left | Wound class: Clean

## 2012-03-11 MED ORDER — SODIUM CHLORIDE 0.9 % IR SOLN
Status: DC | PRN
  Administered 2012-03-11: 10:00:00

## 2012-03-11 MED ORDER — CEFAZOLIN SODIUM 1-5 GM-% IV SOLN
1.0000 g | Freq: Three times a day (TID) | INTRAVENOUS | Status: AC
  Administered 2012-03-11 (×2): 1 g via INTRAVENOUS
  Filled 2012-03-11 (×2): qty 50

## 2012-03-11 MED ORDER — ESCITALOPRAM OXALATE 20 MG PO TABS
20.0000 mg | ORAL_TABLET | Freq: Every day | ORAL | Status: DC
  Administered 2012-03-12 – 2012-03-15 (×4): 20 mg via ORAL
  Filled 2012-03-11 (×4): qty 1

## 2012-03-11 MED ORDER — SODIUM CHLORIDE 0.9 % IJ SOLN: 3.0000 mL | INTRAMUSCULAR | Status: DC | PRN

## 2012-03-11 MED ORDER — DEXAMETHASONE SODIUM PHOSPHATE 10 MG/ML IJ SOLN
INTRAMUSCULAR | Status: AC
  Administered 2012-03-11: 10 mg via INTRAVENOUS
  Filled 2012-03-11: qty 1

## 2012-03-11 MED ORDER — OXYCODONE HCL 5 MG PO TABS
5.0000 mg | ORAL_TABLET | Freq: Once | ORAL | Status: AC | PRN
  Administered 2012-03-11: 5 mg via ORAL

## 2012-03-11 MED ORDER — SUCCINYLCHOLINE CHLORIDE 20 MG/ML IJ SOLN
INTRAMUSCULAR | Status: DC | PRN
  Administered 2012-03-11: 120 mg via INTRAVENOUS

## 2012-03-11 MED ORDER — FENTANYL CITRATE 0.05 MG/ML IJ SOLN
INTRAMUSCULAR | Status: DC | PRN
  Administered 2012-03-11: 100 ug via INTRAVENOUS
  Administered 2012-03-11 (×2): 25 ug via INTRAVENOUS

## 2012-03-11 MED ORDER — DIAZEPAM 5 MG PO TABS
5.0000 mg | ORAL_TABLET | Freq: Four times a day (QID) | ORAL | Status: DC | PRN
  Administered 2012-03-11 – 2012-03-13 (×4): 10 mg via ORAL
  Administered 2012-03-13 (×2): 5 mg via ORAL
  Administered 2012-03-14: 10 mg via ORAL
  Administered 2012-03-14: 5 mg via ORAL
  Filled 2012-03-11: qty 2
  Filled 2012-03-11: qty 1
  Filled 2012-03-11 (×4): qty 2
  Filled 2012-03-11: qty 1
  Filled 2012-03-11 (×2): qty 2

## 2012-03-11 MED ORDER — BACITRACIN 50000 UNITS IM SOLR
INTRAMUSCULAR | Status: AC
  Filled 2012-03-11: qty 1

## 2012-03-11 MED ORDER — MIDAZOLAM HCL 5 MG/5ML IJ SOLN
INTRAMUSCULAR | Status: DC | PRN
  Administered 2012-03-11: 2 mg via INTRAVENOUS

## 2012-03-11 MED ORDER — BISACODYL 10 MG RE SUPP: 10.0000 mg | Freq: Every day | RECTAL | Status: DC | PRN

## 2012-03-11 MED ORDER — HYDROMORPHONE HCL PF 1 MG/ML IJ SOLN
INTRAMUSCULAR | Status: AC
  Filled 2012-03-11: qty 1

## 2012-03-11 MED ORDER — 0.9 % SODIUM CHLORIDE (POUR BTL) OPTIME
TOPICAL | Status: DC | PRN
  Administered 2012-03-11: 1000 mL

## 2012-03-11 MED ORDER — ONDANSETRON HCL 4 MG/2ML IJ SOLN: 4.0000 mg | INTRAMUSCULAR | Status: DC | PRN

## 2012-03-11 MED ORDER — SODIUM CHLORIDE 0.9 % IV SOLN: 250.0000 mL | INTRAVENOUS | Status: DC

## 2012-03-11 MED ORDER — ONDANSETRON HCL 4 MG/2ML IJ SOLN: 4.0000 mg | Freq: Four times a day (QID) | INTRAMUSCULAR | Status: DC | PRN

## 2012-03-11 MED ORDER — PHENOL 1.4 % MT LIQD: 1.0000 | OROMUCOSAL | Status: DC | PRN

## 2012-03-11 MED ORDER — ACETAMINOPHEN 325 MG PO TABS: 650.0000 mg | ORAL_TABLET | ORAL | Status: DC | PRN

## 2012-03-11 MED ORDER — TIMOLOL MALEATE 0.5 % OP SOLN
1.0000 [drp] | Freq: Two times a day (BID) | OPHTHALMIC | Status: DC
  Administered 2012-03-11 – 2012-03-15 (×8): 1 [drp] via OPHTHALMIC
  Filled 2012-03-11: qty 5

## 2012-03-11 MED ORDER — ONDANSETRON HCL 4 MG/2ML IJ SOLN
INTRAMUSCULAR | Status: DC | PRN
  Administered 2012-03-11: 4 mg via INTRAVENOUS

## 2012-03-11 MED ORDER — HYDROCODONE-ACETAMINOPHEN 5-325 MG PO TABS
1.0000 | ORAL_TABLET | ORAL | Status: DC | PRN
  Administered 2012-03-11 – 2012-03-14 (×2): 2 via ORAL
  Filled 2012-03-11 (×2): qty 2

## 2012-03-11 MED ORDER — ZOLPIDEM TARTRATE 5 MG PO TABS: 5.0000 mg | ORAL_TABLET | Freq: Every evening | ORAL | Status: DC | PRN

## 2012-03-11 MED ORDER — ALUM & MAG HYDROXIDE-SIMETH 200-200-20 MG/5ML PO SUSP
30.0000 mL | Freq: Four times a day (QID) | ORAL | Status: DC | PRN
  Administered 2012-03-12: 30 mL via ORAL
  Filled 2012-03-11: qty 30

## 2012-03-11 MED ORDER — SODIUM CHLORIDE 0.9 % IV SOLN
INTRAVENOUS | Status: AC
  Filled 2012-03-11: qty 500

## 2012-03-11 MED ORDER — SIMVASTATIN 20 MG PO TABS
20.0000 mg | ORAL_TABLET | Freq: Every evening | ORAL | Status: DC
  Administered 2012-03-11 – 2012-03-14 (×4): 20 mg via ORAL
  Filled 2012-03-11 (×5): qty 1

## 2012-03-11 MED ORDER — PROPOFOL 10 MG/ML IV BOLUS
INTRAVENOUS | Status: DC | PRN
  Administered 2012-03-11: 150 mg via INTRAVENOUS

## 2012-03-11 MED ORDER — DEXAMETHASONE SODIUM PHOSPHATE 10 MG/ML IJ SOLN: 10.0000 mg | INTRAMUSCULAR | Status: DC

## 2012-03-11 MED ORDER — LATANOPROST 0.005 % OP SOLN
1.0000 [drp] | Freq: Every day | OPHTHALMIC | Status: DC
  Administered 2012-03-11 – 2012-03-14 (×4): 1 [drp] via OPHTHALMIC
  Filled 2012-03-11: qty 2.5

## 2012-03-11 MED ORDER — SODIUM CHLORIDE 0.9 % IJ SOLN
3.0000 mL | Freq: Two times a day (BID) | INTRAMUSCULAR | Status: DC
  Administered 2012-03-11 – 2012-03-15 (×7): 3 mL via INTRAVENOUS

## 2012-03-11 MED ORDER — IRBESARTAN 150 MG PO TABS
150.0000 mg | ORAL_TABLET | Freq: Every day | ORAL | Status: DC
  Administered 2012-03-11 – 2012-03-15 (×5): 150 mg via ORAL
  Filled 2012-03-11 (×5): qty 1

## 2012-03-11 MED ORDER — SENNA 8.6 MG PO TABS
1.0000 | ORAL_TABLET | Freq: Two times a day (BID) | ORAL | Status: DC
  Administered 2012-03-11 – 2012-03-15 (×9): 8.6 mg via ORAL
  Filled 2012-03-11 (×11): qty 1

## 2012-03-11 MED ORDER — TRIAMTERENE-HCTZ 37.5-25 MG PO TABS
1.0000 | ORAL_TABLET | Freq: Every day | ORAL | Status: DC
  Administered 2012-03-11 – 2012-03-15 (×5): 1 via ORAL
  Filled 2012-03-11 (×5): qty 1

## 2012-03-11 MED ORDER — OXYCODONE-ACETAMINOPHEN 5-325 MG PO TABS
1.0000 | ORAL_TABLET | ORAL | Status: DC | PRN
  Administered 2012-03-11 – 2012-03-15 (×15): 2 via ORAL
  Filled 2012-03-11 (×15): qty 2

## 2012-03-11 MED ORDER — ACETAMINOPHEN 650 MG RE SUPP: 650.0000 mg | RECTAL | Status: DC | PRN

## 2012-03-11 MED ORDER — OXYCODONE HCL 5 MG PO TABS
ORAL_TABLET | ORAL | Status: AC
  Filled 2012-03-11: qty 1

## 2012-03-11 MED ORDER — LIDOCAINE HCL (CARDIAC) 20 MG/ML IV SOLN
INTRAVENOUS | Status: DC | PRN
  Administered 2012-03-11: 60 mg via INTRAVENOUS

## 2012-03-11 MED ORDER — PANTOPRAZOLE SODIUM 40 MG PO TBEC
40.0000 mg | DELAYED_RELEASE_TABLET | Freq: Every day | ORAL | Status: DC
  Administered 2012-03-12 – 2012-03-15 (×4): 40 mg via ORAL
  Filled 2012-03-11 (×4): qty 1

## 2012-03-11 MED ORDER — OXYCODONE HCL 5 MG/5ML PO SOLN: 5.0000 mg | Freq: Once | ORAL | Status: AC | PRN

## 2012-03-11 MED ORDER — MENTHOL 3 MG MT LOZG: 1.0000 | LOZENGE | OROMUCOSAL | Status: DC | PRN

## 2012-03-11 MED ORDER — BRIMONIDINE TARTRATE-TIMOLOL 0.2-0.5 % OP SOLN: 1.0000 [drp] | Freq: Two times a day (BID) | OPHTHALMIC | Status: DC

## 2012-03-11 MED ORDER — HYDROMORPHONE HCL PF 1 MG/ML IJ SOLN
0.2500 mg | INTRAMUSCULAR | Status: DC | PRN
  Administered 2012-03-11 (×4): 0.5 mg via INTRAVENOUS

## 2012-03-11 MED ORDER — FLEET ENEMA 7-19 GM/118ML RE ENEM: 1.0000 | ENEMA | Freq: Once | RECTAL | Status: AC | PRN

## 2012-03-11 MED ORDER — LACTATED RINGERS IV SOLN
INTRAVENOUS | Status: DC | PRN
  Administered 2012-03-11 (×3): via INTRAVENOUS

## 2012-03-11 MED ORDER — POLYETHYLENE GLYCOL 3350 17 G PO PACK
17.0000 g | PACK | Freq: Every day | ORAL | Status: DC | PRN
  Administered 2012-03-15: 17 g via ORAL
  Filled 2012-03-11: qty 1

## 2012-03-11 MED ORDER — BRIMONIDINE TARTRATE 0.2 % OP SOLN
1.0000 [drp] | Freq: Two times a day (BID) | OPHTHALMIC | Status: DC
  Administered 2012-03-11 – 2012-03-15 (×8): 1 [drp] via OPHTHALMIC
  Filled 2012-03-11: qty 5

## 2012-03-11 MED ORDER — HYDROMORPHONE HCL PF 1 MG/ML IJ SOLN
0.5000 mg | INTRAMUSCULAR | Status: DC | PRN
  Administered 2012-03-12: 1 mg via INTRAVENOUS
  Filled 2012-03-11: qty 1

## 2012-03-11 MED ORDER — LEVOTHYROXINE SODIUM 50 MCG PO TABS
50.0000 ug | ORAL_TABLET | Freq: Every day | ORAL | Status: DC
  Administered 2012-03-12 – 2012-03-15 (×4): 50 ug via ORAL
  Filled 2012-03-11 (×5): qty 1

## 2012-03-11 MED ORDER — EPHEDRINE SULFATE 50 MG/ML IJ SOLN
INTRAMUSCULAR | Status: DC | PRN
  Administered 2012-03-11 (×2): 15 mg via INTRAVENOUS
  Administered 2012-03-11: 10 mg via INTRAVENOUS

## 2012-03-11 SURGICAL SUPPLY — 73 items
ADH SKN CLS APL DERMABOND .7 (GAUZE/BANDAGES/DRESSINGS) ×2
ADH SKN CLS LQ APL DERMABOND (GAUZE/BANDAGES/DRESSINGS) ×1
APL SKNCLS STERI-STRIP NONHPOA (GAUZE/BANDAGES/DRESSINGS) ×1
BAG DECANTER FOR FLEXI CONT (MISCELLANEOUS) ×3 IMPLANT
BENZOIN TINCTURE PRP APPL 2/3 (GAUZE/BANDAGES/DRESSINGS) ×3 IMPLANT
BLADE SURG ROTATE 9660 (MISCELLANEOUS) ×1 IMPLANT
BONE MATRIX OSTEOCEL PLUS 5CC (Bone Implant) ×1 IMPLANT
BRUSH SCRUB EZ PLAIN DRY (MISCELLANEOUS) ×1 IMPLANT
CLOTH BEACON ORANGE TIMEOUT ST (SAFETY) ×3 IMPLANT
CONT SPEC 4OZ CLIKSEAL STRL BL (MISCELLANEOUS) IMPLANT
COVER BACK TABLE 24X17X13 BIG (DRAPES) ×1 IMPLANT
COVER TABLE BACK 60X90 (DRAPES) ×1 IMPLANT
DERMABOND ADHESIVE PROPEN (GAUZE/BANDAGES/DRESSINGS) ×1
DERMABOND ADVANCED (GAUZE/BANDAGES/DRESSINGS) ×2
DERMABOND ADVANCED .7 DNX12 (GAUZE/BANDAGES/DRESSINGS) ×2 IMPLANT
DERMABOND ADVANCED .7 DNX6 (GAUZE/BANDAGES/DRESSINGS) IMPLANT
DRAPE C-ARM 42X72 X-RAY (DRAPES) ×3 IMPLANT
DRAPE C-ARMOR (DRAPES) ×3 IMPLANT
DRAPE LAPAROTOMY 100X72X124 (DRAPES) ×3 IMPLANT
DRAPE POUCH INSTRU U-SHP 10X18 (DRAPES) ×2 IMPLANT
DRAPE SURG 17X23 STRL (DRAPES) ×6 IMPLANT
ELECT BLADE 4.0 EZ CLEAN MEGAD (MISCELLANEOUS)
ELECT REM PT RETURN 9FT ADLT (ELECTROSURGICAL) ×2
ELECTRODE BLDE 4.0 EZ CLN MEGD (MISCELLANEOUS) IMPLANT
ELECTRODE REM PT RTRN 9FT ADLT (ELECTROSURGICAL) ×2 IMPLANT
EVACUATOR 1/8 PVC DRAIN (DRAIN) IMPLANT
GAUZE SPONGE 4X4 16PLY XRAY LF (GAUZE/BANDAGES/DRESSINGS) IMPLANT
GLOVE BIOGEL PI IND STRL 7.0 (GLOVE) IMPLANT
GLOVE BIOGEL PI INDICATOR 7.0 (GLOVE) ×2
GLOVE ECLIPSE 7.5 STRL STRAW (GLOVE) ×1 IMPLANT
GLOVE ECLIPSE 8.5 STRL (GLOVE) ×4 IMPLANT
GLOVE EXAM NITRILE LRG STRL (GLOVE) IMPLANT
GLOVE EXAM NITRILE MD LF STRL (GLOVE) ×1 IMPLANT
GLOVE EXAM NITRILE XL STR (GLOVE) IMPLANT
GLOVE EXAM NITRILE XS STR PU (GLOVE) IMPLANT
GLOVE SS BIOGEL STRL SZ 6.5 (GLOVE) IMPLANT
GLOVE SUPERSENSE BIOGEL SZ 6.5 (GLOVE) ×3
GOWN BRE IMP SLV AUR LG STRL (GOWN DISPOSABLE) ×1 IMPLANT
GOWN BRE IMP SLV AUR XL STRL (GOWN DISPOSABLE) ×5 IMPLANT
GOWN STRL REIN 2XL LVL4 (GOWN DISPOSABLE) IMPLANT
GUIDEWIRE NITINOL BEVEL TIP (WIRE) ×2 IMPLANT
IMPL COROENT LDTXL 10X18X55 (Intraocular Lens) IMPLANT
IMPLANT COROENT LDTXL 10X18X55 (Intraocular Lens) ×2 IMPLANT
KIT BASIN OR (CUSTOM PROCEDURE TRAY) ×3 IMPLANT
KIT DILATOR XLIF 5 (KITS) IMPLANT
KIT MAXCESS (KITS) ×1 IMPLANT
KIT NDL NVM5 EMG ELECT (KITS) IMPLANT
KIT NEEDLE NVM5 EMG ELECT (KITS) ×1 IMPLANT
KIT NEEDLE NVM5 EMG ELECTRODE (KITS) ×1
KIT ROOM TURNOVER OR (KITS) ×3 IMPLANT
KIT XLIF (KITS) ×1
NDL I-PASS III (NEEDLE) IMPLANT
NEEDLE HYPO 22GX1.5 SAFETY (NEEDLE) ×3 IMPLANT
NEEDLE I-PASS III (NEEDLE) ×2 IMPLANT
NS IRRIG 1000ML POUR BTL (IV SOLUTION) ×3 IMPLANT
PACK LAMINECTOMY NEURO (CUSTOM PROCEDURE TRAY) ×3 IMPLANT
PUTTY BONE DBX 5CC MIX (Putty) ×1 IMPLANT
ROD 45MM (Rod) ×1 IMPLANT
SCREW PRECEPT 6.5X40 (Screw) ×2 IMPLANT
SCREW PRECEPT SET (Screw) ×2 IMPLANT
SPONGE GAUZE 4X4 12PLY (GAUZE/BANDAGES/DRESSINGS) ×1 IMPLANT
SPONGE LAP 4X18 X RAY DECT (DISPOSABLE) IMPLANT
SPONGE SURGIFOAM ABS GEL SZ50 (HEMOSTASIS) IMPLANT
STRIP CLOSURE SKIN 1/2X4 (GAUZE/BANDAGES/DRESSINGS) ×2 IMPLANT
SUT VIC AB 2-0 CT1 18 (SUTURE) ×5 IMPLANT
SUT VIC AB 3-0 SH 8-18 (SUTURE) ×5 IMPLANT
SYR 20ML ECCENTRIC (SYRINGE) ×3 IMPLANT
TAPE CLOTH 3X10 TAN LF (GAUZE/BANDAGES/DRESSINGS) ×5 IMPLANT
TAPE CLOTH SURG 4X10 WHT LF (GAUZE/BANDAGES/DRESSINGS) ×1 IMPLANT
TOWEL OR 17X24 6PK STRL BLUE (TOWEL DISPOSABLE) ×3 IMPLANT
TOWEL OR 17X26 10 PK STRL BLUE (TOWEL DISPOSABLE) ×3 IMPLANT
TRAY FOLEY CATH 14FRSI W/METER (CATHETERS) ×2 IMPLANT
WATER STERILE IRR 1000ML POUR (IV SOLUTION) ×3 IMPLANT

## 2012-03-11 NOTE — Anesthesia Postprocedure Evaluation (Signed)
Anesthesia Post Note  Patient: [name]  Procedure(s) Performed: Procedure(s) (LRB): ANTERIOR LATERAL LUMBAR FUSION 1 LEVEL (Left) LUMBAR PERCUTANEOUS PEDICLE SCREW 1 LEVEL (Left)  Anesthesia type: General  Patient location: PACU  Post pain: Pain level controlled and Adequate analgesia  Post assessment: Post-op Vital signs reviewed, Patient's Cardiovascular Status Stable, Respiratory Function Stable, Patent Airway and Pain level controlled  Last Vitals:  Filed Vitals:   03/11/12 1030  BP: 143/73  Pulse:   Temp: 36.4 C  Resp:     Post vital signs: Reviewed and stable  Level of consciousness: awake, alert  and oriented  Complications: No apparent anesthesia complications

## 2012-03-11 NOTE — Preoperative (Signed)
Beta Blockers   Reason not to administer Beta Blockers:Not Applicable 

## 2012-03-11 NOTE — Anesthesia Procedure Notes (Signed)
Procedure Name: Intubation Date/Time: 03/11/2012 8:02 AM Performed by: Rogelia Boga Pre-anesthesia Checklist: Patient identified, Emergency Drugs available, Suction available, Patient being monitored and Timeout performed Patient Re-evaluated:Patient Re-evaluated prior to inductionOxygen Delivery Method: Circle system utilized Preoxygenation: Pre-oxygenation with 100% oxygen Intubation Type: IV induction Ventilation: Mask ventilation without difficulty Laryngoscope Size: Mac and 4 Grade View: Grade II Tube type: Oral Tube size: 7.5 mm Number of attempts: 1 Airway Equipment and Method: Stylet Placement Confirmation: ETT inserted through vocal cords under direct vision,  positive ETCO2 and breath sounds checked- equal and bilateral Secured at: 22 cm Tube secured with: Tape Dental Injury: Teeth and Oropharynx as per pre-operative assessment

## 2012-03-11 NOTE — Progress Notes (Signed)
Utilization review completed.  

## 2012-03-11 NOTE — Brief Op Note (Signed)
03/11/2012  10:06 AM  PATIENT:  [name]  71 y.o. male  PRE-OPERATIVE DIAGNOSIS:  Degenerative disc disease/stenosis/lateral listhesis  POST-OPERATIVE DIAGNOSIS:  Degenerative disc disease/stenosis/lateral listhesis  PROCEDURE:  Procedure(s) (LRB) with comments: ANTERIOR LATERAL LUMBAR FUSION 1 LEVEL (Left) - Left lumbar four-five extreme lumbar interbody fusion with percutaneous pedicle screws  LUMBAR PERCUTANEOUS PEDICLE SCREW 1 LEVEL (Left) - Left lumbar four-five extreme lumbar interbody fusion with percutaneous pedicle screws   SURGEON:  Surgeon(s) and Role:    * Temple Pacini, MD - Primary  PHYSICIAN ASSISTANT:   ASSISTANTS: Kritzer    ANESTHESIA:   general  EBL:  Total I/O In: 2000 [I.V.:2000] Out: 300 [Urine:300]  BLOOD ADMINISTERED:none  DRAINS: none   LOCAL MEDICATIONS USED:  MARCAINE     SPECIMEN:  No Specimen  DISPOSITION OF SPECIMEN:  N/A  COUNTS:  YES  TOURNIQUET:  * No tourniquets in log *  DICTATION: .Dragon Dictation  PLAN OF CARE: Admit to inpatient   PATIENT DISPOSITION:  PACU - hemodynamically stable.   Delay start of Pharmacological VTE agent (>24hrs) due to surgical blood loss or risk of bleeding: yes

## 2012-03-11 NOTE — H&P (Signed)
[name] is an 71 y.o. male.   Chief Complaint: Back pain and left leg pain. HPI: 71 year old male with intractable back pain with radiation to his lower extremities left greater than right. Patient status post previous decompressive surgery with minimal improvement. Patient has been worked up and found to have marked disc space collapse with a lateral listhesis and severe foraminal stenosis. 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 He has failed all efforts at conservative management including epidural steroid injections activity modifications therapy and anti-inflammatory medications. Patient presents now for left-sided L4-5 anterolateral decompression infusion with Perkins pedicle fixation in hopes of improving his symptoms.  Past Medical History  Diagnosis Date  . BPH (benign prostatic hyperplasia)   . Urinary retention   . Hypertension     takes meds daily  . Heart murmur   . Hypothyroidism   . Anxiety   . GERD (gastroesophageal reflux disease)   . H/O hiatal hernia   . Dizziness   . Cancer     throat  . Glaucoma     left worse than right  . Hyperlipemia     Past Surgical History  Procedure Date  . Radical neck dissection 1989  . Rotator cuff repair     right  . Hernia repair   . Back surgery     ruptured disc repair  . Tonsillectomy   . Eye surgery     left cataract    History reviewed. No pertinent family history. Social History:  reports that he has quit smoking. He does not have any smokeless tobacco history on file. He reports that he drinks alcohol. He reports that he does not use illicit drugs.  Allergies:  Allergies  Allergen Reactions  . Codeine Nausea Only    Medications Prior to Admission  Medication Sig Dispense Refill  . brimonidine-timolol (COMBIGAN) 0.2-0.5 % ophthalmic solution Place 1 drop into both eyes every 12 (twelve) hours.      Marland Kitchen escitalopram (LEXAPRO) 20 MG tablet Take 20 mg by mouth daily.      Marland Kitchen latanoprost  (XALATAN) 0.005 % ophthalmic solution Place 1 drop into both eyes at bedtime.      Marland Kitchen levothyroxine (SYNTHROID, LEVOTHROID) 50 MCG tablet Take 50 mcg by mouth daily.      Marland Kitchen omeprazole (PRILOSEC) 20 MG capsule Take 20 mg by mouth daily.      . simvastatin (ZOCOR) 20 MG tablet Take 20 mg by mouth every evening.      . triamterene-hydrochlorothiazide (MAXZIDE-25) 37.5-25 MG per tablet Take 1 tablet by mouth daily.      . valsartan (DIOVAN) 160 MG tablet Take 160 mg by mouth daily.        No results found for this or any previous visit (from the past 48 hour(s)). No results found.  Review of Systems  Constitutional: Negative.   HENT: Negative.   Eyes: Negative.   Respiratory: Negative.   Cardiovascular: Negative.   Gastrointestinal: Negative.   Genitourinary: Negative.   Musculoskeletal: Negative.   Skin: Negative.   Neurological: Negative.   Endo/Heme/Allergies: Negative.   Psychiatric/Behavioral: Negative.     Blood pressure 184/80, pulse 77, temperature 98.5 F (36.9 C), temperature source Oral, resp. rate 20, SpO2 99.00%. Physical Exam  Constitutional: He is oriented to person, place, and time. He appears well-developed and well-nourished.  HENT:  Head: Normocephalic and atraumatic.  Right Ear: External ear normal.  Left Ear: External ear normal.  Nose: Nose normal.  Mouth/Throat: Oropharynx is clear and moist.  Eyes: Conjunctivae normal and EOM are normal. Pupils are equal, round, and reactive to light. Right eye exhibits no discharge. Left eye exhibits no discharge.  Neck: Normal range of motion. Neck supple. No tracheal deviation present. No thyromegaly present.  Cardiovascular: Normal rate, regular rhythm, normal heart sounds and intact distal pulses.   Respiratory: Effort normal and breath sounds normal. No respiratory distress. He has no wheezes.  GI: Soft. Bowel sounds are normal. He exhibits no distension. There is no tenderness.  Musculoskeletal: Normal range of  motion. He exhibits no edema and no tenderness.  Neurological: He is alert and oriented to person, place, and time. He has normal reflexes. He displays normal reflexes. No cranial nerve deficit. Coordination normal.  Skin: Skin is warm and dry. No rash noted. No erythema. No pallor.  Psychiatric: He has a normal mood and affect. His behavior is normal. Judgment and thought content normal.     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 Plan left L4-5 anterior lateral retroperitoneal interbody decompression and fusion with cage instrumentation and morselized allograft coupled with posterior percutaneous pedicle screw fixation. Risks and benefits have been explained. Patient wishes to proceed.  Shakera Ebrahimi A 03/11/2012, 7:51 AM

## 2012-03-11 NOTE — Op Note (Signed)
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   L4-5 posterior percutaneous pedicle fixation  Surgeon:Ricard Faulkner A.Tori Cupps, M.D.  Asst. Surgeon: Gerlene Fee   Anesthesia: General  Indication: 71 year old male status post previous decompressive surgery at L4-5 another physician presents with severe back and lower trimming symptoms failing conservative management her workup demonstrates evidence of marked disc space collapse lateral listhesis and severe foraminal stenosis. Patient's failed conservative management and presents now for decompression and fusion surgery in hopes of improving his symptoms.  Operative note: After induction anesthesia, patient positioned in the right lateral decubitus position and appropriately padded. Fluoroscopy is used to properly position the patient for approach to the L4-5 disc space. Left flank and lumbar region were prepped and draped sterilely. Lateral incision was made between the iliac crest and inferior costal border. Secondary incision was made in the left posterior flank. Utilizing a secondary incision access was gained into the retroperitoneal space. Using a finger within the retroperitoneal space and introducer was passed from the more anterior incision into the psoas muscle on the left side. Utilizing neural monitoring the dilator was then docked into the L4 disc space on the left side. Neural monitoring was used to stimulate to evaluate for adjacent neural structures. Dilators were then passed over the introducer. Stimulation occurred at each step. Self-retaining retractor was placed. Soft tissue retractor was expanded. Using direct  visualization the lateral disc space was explored. No evidence of the lumbar plexus was found. Direct stimulation was then carried out overlying the disc space and the surrounding areas once again no adjacent nerves were identified. A shim was then introduced into the L4-5 disc space. A guidewire was removed. Discectomies and performed by incising the L4-5 disc space and then using various curettes and pituitary rongeurs the disc space was cleaned. Contralateral least as were performed using Cobb elevators. Disc space was sequentially dilated and a 10 mm lordotic cage 55 mm in length and 18 mm in anterior posterior dimension was found to be most appropriate. This cage was then packed with morselized allograft and osteo- cell plus. Slight introducers were then placed within the disc space. The cage was impacted under fluoroscopic guidance and well positioned in both the AP and lateral fluoroscopic planes. Retractor system was removed. Hemostasis be excellent. There were no obvious complications with this aspect of the procedure. The patient's bed was repositioned into a neutral position. Lumbar region was then approached for pedicle screw fixation. 2 separate stab incisions were made over the L4 and L5 pedicles respectively. A Jamshidi needle or use her was then placed into the pedicles of L4 and L5 under fluoroscopic guidance and neural monitoring. Passage was made into the vertebral bodies. Guidewires were placed. The pedicles were then tapped with a 5.5 mm screw tap. 6.5 x 40 mm new invasive screws were then placed into the vertebral bodies of L4 and L5. Once again neural monitoring was used between each step. AP and lateral fraught fluoroscopy demonstrated good positioning of the screws. A 40 50 m rod was then passed in a cephalad screw to the caudal screw. Locking caps and placed over the screw heads. Locking catching and engaged with the construct under some mild compression. Final images revealed good position  of the cage and screw  instrumentation with normal lamina spine. Wounds were then irrigated and closed in typical fashion. Sterile dressings were applied. There were no apparent complications. Patient tolerated the procedure well and returned to recovery room postop.

## 2012-03-11 NOTE — Anesthesia Preprocedure Evaluation (Addendum)
Anesthesia Evaluation  Patient identified by MRN, date of birth, ID band Patient awake    Reviewed: Allergy & Precautions, H&P , NPO status , Patient's Chart, lab work & pertinent test results  Airway Mallampati: II  Neck ROM: full    Dental  (+) Dental Advisory Given   Pulmonary former smoker,          Cardiovascular hypertension, Pt. on medications     Neuro/Psych Anxiety    GI/Hepatic hiatal hernia, GERD-  Medicated and Controlled,  Endo/Other  Hypothyroidism   Renal/GU      Musculoskeletal   Abdominal   Peds  Hematology   Anesthesia Other Findings   Reproductive/Obstetrics                          Anesthesia Physical Anesthesia Plan  ASA: III  Anesthesia Plan: General   Post-op Pain Management:    Induction: Intravenous  Airway Management Planned: Oral ETT  Additional Equipment:   Intra-op Plan:   Post-operative Plan: Extubation in OR  Informed Consent: I have reviewed the patients History and Physical, chart, labs and discussed the procedure including the risks, benefits and alternatives for the proposed anesthesia with the patient or authorized representative who has indicated his/her understanding and acceptance.     Plan Discussed with: CRNA, Surgeon and Anesthesiologist  Anesthesia Plan Comments:        Anesthesia Quick Evaluation

## 2012-03-11 NOTE — Transfer of Care (Signed)
Immediate Anesthesia Transfer of Care Note  Patient: [name]  Procedure(s) Performed: Procedure(s) (LRB) with comments: ANTERIOR LATERAL LUMBAR FUSION 1 LEVEL (Left) - Left lumbar four-five extreme lumbar interbody fusion with percutaneous pedicle screws  LUMBAR PERCUTANEOUS PEDICLE SCREW 1 LEVEL (Left) - Left lumbar four-five extreme lumbar interbody fusion with percutaneous pedicle screws   Patient Location: PACU  Anesthesia Type:General  Level of Consciousness: awake, alert , oriented and patient cooperative  Airway & Oxygen Therapy: Patient Spontanous Breathing and Patient connected to nasal cannula oxygen  Post-op Assessment: Report given to PACU RN, Post -op Vital signs reviewed and stable and Patient moving all extremities X 4  Post vital signs: Reviewed and stable  Complications: No apparent anesthesia complications

## 2012-03-11 NOTE — Progress Notes (Signed)
Orthopedic Tech Progress Note Patient Details:  [name] 05-19-1940 [id number]  Patient ID: [name], male   DOB: 1941-03-10, 71 y.o.   MRN: [id number]   [name] 03/11/2012, 9:09 AM L-S SUPPORT WITH ANT. AND POST. PANELS COMPLETED BY [employer]

## 2012-03-12 NOTE — Progress Notes (Signed)
Subjective: Patient reports Status post X. lift L4-5 day 1. Back pain reasonably well-controlled, patient has concerns about his ability to void having had previous difficulties post anesthesia. Desires to have Foley left in.  Objective: Vital signs in last 24 hours: Temp:  [97.2 F (36.2 C)-98.6 F (37 C)] 97.2 F (36.2 C) (11/09 0940) Pulse Rate:  [74-105] 78  (11/09 0940) Resp:  [18-20] 19  (11/09 0940) BP: (95-157)/(58-85) 104/66 mmHg (11/09 0940) SpO2:  [100 %] 100 % (11/09 0940) Weight:  [83.643 kg (184 lb 6.4 oz)-84.1 kg (185 lb 6.5 oz)] 83.643 kg (184 lb 6.4 oz) (11/08 2000)  Intake/Output from previous day: 11/08 0701 - 11/09 0700 In: 2200 [I.V.:2200] Out: 2375 [Urine:2325; Blood:50] Intake/Output this shift: Total I/O In: -  Out: 600 [Urine:600]  Incisions are clean and dry motor function is good in both lower extremities.  Lab Results: No results found for this basename: WBC:2,HGB:2,HCT:2,PLT:2 in the last 72 hours BMET No results found for this basename: NA:2,K:2,CL:2,CO2:2,GLUCOSE:2,BUN:2,CREATININE:2,CALCIUM:2 in the last 72 hours  Studies/Results: Dg Lumbar Spine 2-3 Views  03/11/2012  *RADIOLOGY REPORT*  Clinical Data: L4-5 X L I F  LUMBAR SPINE - 2-3 VIEW  Comparison: 08/19/2011  Findings: Two C-arm images shows discectomy at L4-5 with interbody fusion material and left-sided pedicle screws and posterior rod.  IMPRESSION: X L I F L4-5 with left-sided pedicle screws and rod.   Original Report Authenticated By: Paulina Fusi, M.D.     Assessment/Plan: Status post X. lift arthrodesis L4-L5 day 1. Concerns for urinary retention.  LOS: 1 day  Leave Foley catheter in. Physical therapy evaluation.   Ladawna Walgren J 03/12/2012, 10:40 AM

## 2012-03-12 NOTE — Evaluation (Signed)
Physical Therapy Evaluation Patient Details Name: KYU CARUTH MRN: [id number] DOB: [birth year] Today's Date: 03/12/2012 Time: 5621-3086 PT Time Calculation (min): 18 min  PT Assessment / Plan / Recommendation Clinical Impression  Pt s/p ALLF L4-5. Pt moving well, slightly limited secondary to pain with mobility. Pt will benefit from skilled PT in the acute care setting in order to maximize functional mobility and safety prior to d/c home with wife    PT Assessment  Patient needs continued PT services    Follow Up Recommendations  No PT follow up;Supervision for mobility/OOB    Does the patient have the potential to tolerate intense rehabilitation      Barriers to Discharge        Equipment Recommendations  Rolling walker with 5" wheels (TBD)    Recommendations for Other Services     Frequency Min 5X/week    Precautions / Restrictions Precautions Precautions: Back Precaution Booklet Issued: Yes (comment) Precaution Comments: pt educated on 3/3 back precautons Required Braces or Orthoses: Spinal Brace Spinal Brace: Lumbar corset;Applied in sitting position Restrictions Weight Bearing Restrictions: No   Pertinent Vitals/Pain Pain 5/10 with bed mobility      Mobility  Bed Mobility Bed Mobility: Rolling Left;Left Sidelying to Sit;Sitting - Scoot to Edge of Bed Rolling Left: 4: Min guard Left Sidelying to Sit: 4: Min guard Sitting - Scoot to [place] of Bed: 4: Min guard Details for Bed Mobility Assistance: VC for proper sequencing to maintain back precautions. Minimal assist for support into sitting Transfers Transfers: Sit to Stand;Stand to Sit Sit to Stand: 5: Supervision Stand to Sit: 5: Supervision;With upper extremity assist;To chair/3-in-1 Details for Transfer Assistance: VC for safe technique to maintain back precautions and avoid bending while standing Ambulation/Gait Ambulation/Gait Assistance: 4: Min assist Ambulation Distance (Feet): 300 Feet Assistive  device: 1 person hand held assist Ambulation/Gait Assistance Details: Min assist with 1 person HHA. Support secondary to pain with mobility. May attempt RW next session for increased comfort and support Gait Pattern: Step-to pattern;Decreased stride length Gait velocity: slow gait speed Stairs: No    Shoulder Instructions     Exercises     PT Diagnosis: Difficulty walking;Acute pain  PT Problem List: Decreased activity tolerance;Decreased mobility;Decreased knowledge of use of DME;Decreased safety awareness;Decreased knowledge of precautions;Pain PT Treatment Interventions: DME instruction;Gait training;Stair training;Functional mobility training;Therapeutic activities;Patient/family education   PT Goals Acute Rehab PT Goals PT Goal Formulation: With patient Time For Goal Achievement: 03/19/12 Potential to Achieve Goals: Good Pt will go Supine/Side to Sit: with modified independence PT Goal: Supine/Side to Sit - Progress: Goal set today Pt will go Sit to Supine/Side: with modified independence PT Goal: Sit to Supine/Side - Progress: Goal set today Pt will go Sit to Stand: with modified independence PT Goal: Sit to Stand - Progress: Goal set today Pt will go Stand to Sit: with modified independence PT Goal: Stand to Sit - Progress: Goal set today Pt will Transfer Bed to Chair/Chair to Bed: with modified independence PT Transfer Goal: Bed to Chair/Chair to Bed - Progress: Goal set today Pt will Ambulate: >150 feet;with modified independence;with least restrictive assistive device PT Goal: Ambulate - Progress: Goal set today Pt will Go Up / Down Stairs: 1-2 stairs;with supervision PT Goal: Up/Down Stairs - Progress: Goal set today  Visit Information  Last PT Received On: 03/12/12 Assistance Needed: +1    Subjective Data  Patient Stated Goal: to be back to normal   Prior Functioning  Home Living Lives With: Spouse  Available Help at Discharge: Family;Available 24  hours/day Type of Home: House Home Access: Stairs to enter [employer] of Steps: 2 Entrance Stairs-Rails: None Home Layout: One level Bathroom Shower/Tub: Walk-in shower;Door [employer] Toilet: Standard Bathroom Accessibility: Yes How Accessible: Accessible via walker Home Adaptive Equipment: Reacher;Built-in shower seat;Hand-held shower hose Prior Function Level of Independence: Independent Able to Take Stairs?: Yes Driving: Yes 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 Dominant Hand: Right    Cognition  Overall Cognitive Status: Appears within functional limits for tasks assessed/performed Arousal/Alertness: Awake/alert Orientation Level: Appears intact for tasks assessed Behavior During Session: [facility] for tasks performed    Extremity/Trunk Assessment Right Upper Extremity Assessment RUE ROM/Strength/Tone: WFL for tasks assessed RUE Sensation: WFL - Light Touch;WFL - Proprioception RUE Coordination: WFL - gross/fine motor Left Upper Extremity Assessment LUE ROM/Strength/Tone: Deficits LUE ROM/Strength/Tone Deficits: from radicle neck surgery Right Lower Extremity Assessment RLE ROM/Strength/Tone: WFL for tasks assessed Left Lower Extremity Assessment LLE ROM/Strength/Tone: WFL for tasks assessed Trunk Assessment Trunk Assessment: Normal   Balance    End of Session PT - End of Session Equipment Utilized During Treatment: Gait belt;Back brace Activity Tolerance: Patient tolerated treatment well Patient left: in chair;with call bell/phone within reach Nurse Communication: Mobility status  GP     Milana Kidney 03/12/2012, 4:48 PM  03/12/2012 Milana Kidney DPT PAGER: [phone] OFFICE: [phone]

## 2012-03-12 NOTE — Progress Notes (Signed)
Occupational Therapy Evaluation Patient Details Name: [name] MRN: [id number] DOB: [birth year] Today's Date: 03/12/2012 Time: 2952-8413 OT Time Calculation (min): 17 min  OT Assessment / Plan / Recommendation Clinical Impression  71 yo s/p lumbar fusion.Lumbar corsett donned in sitting. Completed all education regarding ADL and functional moiblity for ADL while adhereing to back precautions. Written information reviewed and given to pt. Pt demonstrated and verbalized understanding of tasks. Will have 24/7 A at home. Ready for D/C home tomorow.     OT Assessment  Patient does not need any further OT services    Follow Up Recommendations  No OT follow up    Barriers to Discharge      Equipment Recommendations  None recommended by OT    Recommendations for Other Services    Frequency       Precautions / Restrictions Precautions Precautions: Back Precaution Booklet Issued: Yes (comment) Precaution Comments: pt educated on 3/3 back precautons Required Braces or Orthoses: Spinal Brace Spinal Brace: Lumbar corset;Applied in sitting position Restrictions Weight Bearing Restrictions: No   Pertinent Vitals/Pain No c/o pain    ADL  Grooming: Modified independent Where Assessed - Grooming: Unsupported standing Upper Body Bathing: Simulated;Set up Where Assessed - Upper Body Bathing: Unsupported sitting Lower Body Bathing: Minimal assistance Where Assessed - Lower Body Bathing: Unsupported sit to stand Upper Body Dressing: Simulated;Set up Where Assessed - Upper Body Dressing: Unsupported sitting Lower Body Dressing: Minimal assistance Where Assessed - Lower Body Dressing: Unsupported sit to stand Toilet Transfer: Supervision/safety Toilet Transfer Method: Sit to stand;Stand pivot Toilet Transfer Equipment: Comfort height toilet Toileting - Clothing Manipulation and Hygiene: Supervision/safety Where Assessed - Engineer, mining and Hygiene:  Standing Equipment Used: Gait belt;Back brace Transfers/Ambulation Related to ADLs: S. ambulating with S ADL Comments: Educated pt on AE for LB ADL. Pt has all needed AE. Discussed home modification needed to adhere to back precautions. Pt verbalized understanding    OT Diagnosis:    OT Problem List:   OT Treatment Interventions:     OT Goals Acute Rehab OT Goals OT Goal Formulation:  (eval only)  Visit Information  Last OT Received On: 03/12/12 Assistance Needed: +1    Subjective Data      Prior Functioning     Home Living Lives With: Spouse Available Help at Discharge: Family;Available 24 hours/day Type of Home: House Home Access: Stairs to enter [employer] of Steps: 2 Entrance Stairs-Rails: None Home Layout: One level Bathroom Shower/Tub: Walk-in shower;Door [employer] Toilet: Pharmacist, community: Yes How Accessible: Accessible via walker Home Adaptive Equipment: Reacher;Built-in shower seat;Hand-held shower hose Prior Function Level of Independence: Independent Able to Take Stairs?: Yes Driving: Yes 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 Dominant Hand: Right         Vision/Perception     Cognition  Overall Cognitive Status: Appears within functional limits for tasks assessed/performed Arousal/Alertness: Awake/alert Orientation Level: Appears intact for tasks assessed Behavior During Session: [facility] for tasks performed    Extremity/Trunk Assessment Right Upper Extremity Assessment RUE ROM/Strength/Tone: WFL for tasks assessed RUE Sensation: WFL - Light Touch;WFL - Proprioception RUE Coordination: WFL - gross/fine motor Left Upper Extremity Assessment LUE ROM/Strength/Tone: Deficits LUE ROM/Strength/Tone Deficits: from radicle neck surgery Right Lower Extremity Assessment RLE ROM/Strength/Tone: [facility] for tasks assessed Left Lower Extremity Assessment LLE ROM/Strength/Tone: WFL for  tasks assessed Trunk Assessment Trunk Assessment: Normal     Mobility Bed Mobility Bed Mobility: Not assessed Transfers Transfers: Sit to Stand;Stand to Sit  Sit to Stand: 5: Supervision Stand to Sit: 5: Supervision;With upper extremity assist;To chair/3-in-1     Shoulder Instructions     Exercise     Balance  WFL   End of Session OT - End of Session Equipment Utilized During Treatment: Gait belt Activity Tolerance: Patient tolerated treatment well Patient left: in chair;with call bell/phone within reach Nurse Communication: Precautions;Mobility status  GO     Shabree Tebbetts,HILLARY 03/12/2012, 3:12 PM [facility], OTR/L  [phone] 03/12/2012

## 2012-03-13 NOTE — Progress Notes (Signed)
Physical Therapy Treatment Patient Details Name: [name] MRN: [id number] DOB: 31-Oct-1940 Today's Date: 03/13/2012 Time: 4098-1191 PT Time Calculation (min): 18 min  PT Assessment / Plan / Recommendation Comments on Treatment Session  Pt progressing well, although still with episodes requring increased assistance. Will attempt RW next session for increased independence on ambulation and possibly stairs.     Follow Up Recommendations  No PT follow up;Supervision for mobility/OOB     Does the patient have the potential to tolerate intense rehabilitation     Barriers to Discharge        Equipment Recommendations  Rolling walker with 5" wheels    Recommendations for Other Services    Frequency Min 5X/week   Plan Discharge plan remains appropriate;Frequency remains appropriate    Precautions / Restrictions Precautions Precautions: Back Precaution Comments: pt able to verbalize 3/3 back precautions Required Braces or Orthoses: Spinal Brace Spinal Brace: Lumbar corset;Applied in sitting position Restrictions Weight Bearing Restrictions: No   Pertinent Vitals/Pain pain 4/10 with ambulation    Mobility  Bed Mobility Bed Mobility: Rolling Left;Left Sidelying to Sit;Sitting - Scoot to Edge of Bed Rolling Left: 4: Min guard Left Sidelying to Sit: 4: Min guard Sitting - Scoot to [place] of Bed: 4: Min guard Details for Bed Mobility Assistance: VC for proper sequencin to maintain back precautions. No physical assistance although increased time to complete Transfers Transfers: Sit to Stand;Stand to Sit Sit to Stand: 5: Supervision;With upper extremity assist;From bed Stand to Sit: 5: Supervision;With upper extremity assist;To chair/3-in-1 Details for Transfer Assistance: VC for safe technique to maintain back precautions and avoid bending while standing Ambulation/Gait Ambulation/Gait Assistance: 4: Min assist Ambulation Distance (Feet): 400 Feet Assistive device: 1 person  hand held assist Ambulation/Gait Assistance Details: Min assist with 1 person HHA as pt with 2 episodes in which he described his back going out requiring increased assistance to maintain balance. No RW available therefore completed ambulation with HHA. Found RW at end of session and put in room, will attempt next session.  Gait Pattern: Step-to pattern;Decreased stride length Gait velocity: slow gait speed Stairs: Yes Stairs Assistance: 4: Min assist Stairs Assistance Details (indicate cue type and reason): VC for proper sequencing. Min assist for support as pt has no hand rails, may attempt RW next session fr increased independence Stair Management Technique: No rails;Step to pattern;Forwards Number of Stairs: 3     Exercises     PT Diagnosis:    PT Problem List:   PT Treatment Interventions:     PT Goals Acute Rehab PT Goals PT Goal: Supine/Side to Sit - Progress: Progressing toward goal PT Goal: Sit to Supine/Side - Progress: Progressing toward goal PT Goal: Sit to Stand - Progress: Progressing toward goal PT Goal: Stand to Sit - Progress: Progressing toward goal PT Transfer Goal: Bed to Chair/Chair to Bed - Progress: Progressing toward goal PT Goal: Ambulate - Progress: Progressing toward goal PT Goal: Up/Down Stairs - Progress: Progressing toward goal  Visit Information  Last PT Received On: 03/13/12 Assistance Needed: +1    Subjective Data      Cognition  Overall Cognitive Status: Appears within functional limits for tasks assessed/performed Arousal/Alertness: Awake/alert Orientation Level: Appears intact for tasks assessed Behavior During Session: [facility] for tasks performed    Balance     End of Session PT - End of Session Equipment Utilized During Treatment: Gait belt;Back brace Activity Tolerance: Patient tolerated treatment well Patient left: in chair;with call bell/phone within reach Nurse Communication:  Mobility status   GP     Milana Kidney 03/13/2012, 1:26 PM

## 2012-03-13 NOTE — Progress Notes (Signed)
Patient ID: [name], male   DOB: [birth year], 71 y.o.   MRN: [id number] Afeb, VSS Complains of more pain than yesterday, and does not feel comfortable going home. Also, his foley is still in, so we do not know if he can urinate post op yet. Will d/c foley later tonight, and plan on d/c per Dr Jordan Likes tomorrow.

## 2012-03-14 ENCOUNTER — Encounter (HOSPITAL_COMMUNITY): Payer: Self-pay | Admitting: Neurosurgery

## 2012-03-14 NOTE — Progress Notes (Signed)
Physical Therapy Treatment Patient Details Name: [name] MRN: [id number] DOB: 10-24-1940 Today's Date: 03/14/2012 Time: 9147-8295 PT Time Calculation (min): 17 min  PT Assessment / Plan / Recommendation Comments on Treatment Session  Utilized RW for ambulation today, requiring min cues for safe use of RW.  Ambulated at [facility] (A) level.      Follow Up Recommendations  No PT follow up;Supervision for mobility/OOB     Does the patient have the potential to tolerate intense rehabilitation     Barriers to Discharge        Equipment Recommendations  Rolling walker with 5" wheels    Recommendations for Other Services    Frequency Min 5X/week   Plan Discharge plan remains appropriate;Frequency remains appropriate    Precautions / Restrictions Precautions Precautions: Back Precaution Comments: Pt required cuing for "no twisting" Required Braces or Orthoses: Spinal Brace Spinal Brace: Lumbar corset;Applied in sitting position Restrictions Weight Bearing Restrictions: No   Pertinent Vitals/Pain 8/10 back with activity.  RN notified for pain medication.     Mobility  Bed Mobility Bed Mobility: Not assessed Transfers Transfers: Sit to Stand;Stand to Sit Sit to Stand: 5: Supervision;With upper extremity assist;With armrests;From chair/3-in-1 Stand to Sit: 5: Supervision;With upper extremity assist;With armrests;To chair/3-in-1 Details for Transfer Assistance: Supervision for safety Ambulation/Gait Ambulation/Gait Assistance: 4: Min guard Ambulation Distance (Feet): 200 Feet Assistive device: Rolling walker Ambulation/Gait Assistance Details: Cues for safe use of RW, body positioning inside RW, tall posture, increase step/stride length, & hand placement on RW grips.    Gait Pattern: Step-through pattern;Decreased stride length (decreased step height) Gait velocity: slow gait speed Stairs: Yes Stairs Assistance: 4: Min guard Stairs Assistance Details (indicate cue  type and reason): Cues for sequencing & technique.  Used bil rails to mimic holding onto door frame.     Stair Management Technique: Two rails Number of Stairs: 3  Wheelchair Mobility Wheelchair Mobility: No      PT Goals Acute Rehab PT Goals PT Goal Formulation: With patient Time For Goal Achievement: 03/19/12 Potential to Achieve Goals: Good Pt will go Supine/Side to Sit: with modified independence Pt will go Sit to Supine/Side: with modified independence Pt will go Sit to Stand: with modified independence PT Goal: Sit to Stand - Progress: Progressing toward goal Pt will go Stand to Sit: with modified independence PT Goal: Stand to Sit - Progress: Progressing toward goal Pt will Transfer Bed to Chair/Chair to Bed: with modified independence Pt will Ambulate: >150 feet;with modified independence;with least restrictive assistive device PT Goal: Ambulate - Progress: Progressing toward goal Pt will Go Up / Down Stairs: 1-2 stairs;with supervision PT Goal: Up/Down Stairs - Progress: Progressing toward goal  Visit Information  Last PT Received On: 03/14/12 Assistance Needed: +1    Subjective Data      Cognition  Overall Cognitive Status: Appears within functional limits for tasks assessed/performed Arousal/Alertness: Awake/alert Orientation Level: Appears intact for tasks assessed Behavior During Session: [facility] for tasks performed    Balance  Balance Balance Assessed: No  End of Session PT - End of Session Equipment Utilized During Treatment: Gait belt;Back brace Activity Tolerance: Patient tolerated treatment well Patient left: in chair;with call bell/phone within reach;with family/visitor present Nurse Communication: Patient requests pain meds;Mobility status     [name], [place] [phone] 03/14/2012

## 2012-03-15 MED ORDER — HYDROCODONE-ACETAMINOPHEN 5-325 MG PO TABS: 1.0000 | ORAL_TABLET | ORAL | Status: DC | PRN

## 2012-03-15 MED ORDER — METAXALONE 800 MG PO TABS: 800.0000 mg | ORAL_TABLET | Freq: Three times a day (TID) | ORAL | Status: DC

## 2012-03-15 NOTE — Discharge Planning (Signed)
AVn printed D/c instructions given to pt and wife sandra both  Demonstrate understanding by verbalizing 3 out of 3 pack precaution and pt demonstrated using back brace correctly.. Medicated for pain and D/C home . Condition stablei

## 2012-03-15 NOTE — Discharge Summary (Signed)
Physician Discharge Summary  Patient ID: [name] MRN: [id number] DOB/AGE: [birth year] 71 y.o.  Admit date: 03/11/2012 Discharge date: 03/15/2012  Admission Diagnoses:  Discharge Diagnoses:  Principal Problem:  *Lumbar stenosis with neurogenic claudication   Discharged Condition: good  Hospital Course: Admitted the hospital where he underwent uncomplicated L4-5 decompression and fusion. Postoperatively he is much improved. Back in r pain has essentially resolved. Strength station are normal. His wounds are healing well. He is tolerating regular diet and voiding without difficulty. He feels ready for discharge home.  Consults:   Significant Diagnostic Studies:   Treatments:   Discharge Exam: Blood pressure 115/68, pulse 68, temperature 98.5 F (36.9 C), temperature source Oral, resp. rate 18, height 5\' 9"  (1.753 m), weight 83.643 kg (184 lb 6.4 oz), SpO2 99.00%. He is awake and alert oriented and appropriate her cranial nerve function is intact. Motor and sensory function extremities is normal. Wound clean dry and intact. Chest and abdomen benign.  Disposition:      Medication List     As of 03/15/2012 10:45 AM    TAKE these medications         COMBIGAN 0.2-0.5 % ophthalmic solution   Generic drug: brimonidine-timolol   Place 1 drop into both eyes every 12 (twelve) hours.      escitalopram 20 MG tablet   Commonly known as: LEXAPRO   Take 20 mg by mouth daily.      HYDROcodone-acetaminophen 5-325 MG per tablet   Commonly known as: NORCO/VICODIN   Take 1-2 tablets by mouth every 4 (four) hours as needed.      latanoprost 0.005 % ophthalmic solution   Commonly known as: XALATAN   Place 1 drop into both eyes at bedtime.      levothyroxine 50 MCG tablet   Commonly known as: SYNTHROID, LEVOTHROID   Take 50 mcg by mouth daily.      metaxalone 800 MG tablet   Commonly known as: SKELAXIN   Take 1 tablet (800 mg total) by mouth 3 (three) times daily.     omeprazole 20 MG capsule   Commonly known as: PRILOSEC   Take 20 mg by mouth daily.      simvastatin 20 MG tablet   Commonly known as: ZOCOR   Take 20 mg by mouth every evening.      triamterene-hydrochlorothiazide 37.5-25 MG per tablet   Commonly known as: MAXZIDE-25   Take 1 tablet by mouth daily.      valsartan 160 MG tablet   Commonly known as: DIOVAN   Take 160 mg by mouth daily.           Follow-up Information    Follow up with [name], MD. Call in 1 week. (Ask for [name])    Contact information:   [address] [place] [address] [phone]          Signed: Jalessa Peyser A 03/15/2012, 10:45 AM

## 2012-03-15 NOTE — Progress Notes (Signed)
Physical Therapy Treatment Patient Details Name: [name] MRN: [id number] DOB: [birth year] Today's Date: 03/15/2012 Time: 9147-8295 PT Time Calculation (min): 21 min  PT Assessment / Plan / Recommendation Comments on Treatment Session  Pt. ambulated with RW today, min guard progressing to supervision in hallway. Pt. still presents with some decreased safety awareness & requiring cueing to reinforce back precautions with mobility.  Pt may benefit from safety evaluation/HHPT in the home for further assessment in the home. Pt. stated his wife just had surgery and will be able to tell him what to do but unable to provide limited physical (A). Will discuss changes for D/C with a PT.    Follow Up Recommendations  Home health PT     Does the patient have the potential to tolerate intense rehabilitation     Barriers to Discharge        Equipment Recommendations  Rolling walker with 5" wheels    Recommendations for Other Services    Frequency Min 5X/week   Plan Discharge plan needs to be updated    Precautions / Restrictions Precautions Precautions: Back Precaution Comments: Pt required cuing for "no twisting" Required Braces or Orthoses: Spinal Brace Spinal Brace: Lumbar corset;Applied in sitting position Restrictions Weight Bearing Restrictions: No   Pertinent Vitals/Pain Patient did not report of any pain during PT session    Mobility  Bed Mobility Bed Mobility: Rolling Left;Left Sidelying to Sit;Sitting - Scoot to [place] of Bed;Sit to Sidelying Left Rolling Left: 5: Supervision Left Sidelying to Sit: 4: Min guard;HOB flat Sitting - Scoot to Edge of Bed: 4: Min guard Sit to Sidelying Left: 4: Min guard;HOB flat Details for Bed Mobility Assistance: Min guard for safety and guarding while pt. getting to sitting position at EOB. Pt. given VC for proper technique with bed mobility and still presents with some signs of poor safety awareness with back precautions (sit>supine  without logrolling). Education on why log rolling is appropriate method. VC for rolling as one unit vs. segmentally. Transfers Transfers: Sit to Stand;Stand to Sit Sit to Stand: 5: Supervision;With upper extremity assist;From bed;From chair/3-in-1;With armrests Stand to Sit: 5: Supervision;With upper extremity assist;To bed;To chair/3-in-1;With armrests Details for Transfer Assistance: VC for not standing without RW in front of him. Pt. supervision with standing for safety and given VC for proper hand placement during standing to and sitting from RW. Pt. educated in following the back precautions during transfers. Ambulation/Gait Ambulation/Gait Assistance: 4: Min guard;5: Supervision Ambulation Distance (Feet): 500 Feet (500+) Assistive device: Rolling walker Ambulation/Gait Assistance Details: Cues for using RW safely and to stay close to RW. Min guard for safety and supervision at times on clear hallways without congestion/obstacles.  Gait Pattern: Step-through pattern;Decreased stride length Gait velocity: slow gait speed Stairs: Yes Stairs Assistance: 5: Supervision Stairs Assistance Details (indicate cue type and reason): Supervision for safety but pt able to return demonstration of technique from previous PT session. Both rails used to imitate door frame in the patients home. Stair Management Technique: Two rails Number of Stairs: 3  Wheelchair Mobility Wheelchair Mobility: No      PT Goals Acute Rehab PT Goals PT Goal Formulation: With patient Time For Goal Achievement: 03/19/12 Potential to Achieve Goals: Good Pt will go Supine/Side to Sit: with modified independence PT Goal: Supine/Side to Sit - Progress: Progressing toward goal Pt will go Sit to Supine/Side: with modified independence PT Goal: Sit to Supine/Side - Progress: Not met Pt will go Sit to Stand: with modified independence PT  Goal: Sit to Stand - Progress: Not met Pt will go Stand to Sit: with modified  independence PT Goal: Stand to Sit - Progress: Not met Pt will Transfer Bed to Chair/Chair to Bed: with modified independence Pt will Ambulate: >150 feet;with modified independence;with least restrictive assistive device PT Goal: Ambulate - Progress: Progressing toward goal Pt will Go Up / Down Stairs: 1-2 stairs;with supervision PT Goal: Up/Down Stairs - Progress: Progressing toward goal  Visit Information  Last PT Received On: 03/15/12 Assistance Needed: +1    Subjective Data  Subjective: "I'm feeling fine" Patient Stated Goal: to be back to normal   Cognition  Overall Cognitive Status: Appears within functional limits for tasks assessed/performed Arousal/Alertness: Awake/alert Orientation Level: Appears intact for tasks assessed Behavior During Session: [facility] for tasks performed    Balance  Balance Balance Assessed: No  End of Session PT - End of Session Equipment Utilized During Treatment: Gait belt;Back brace Activity Tolerance: Patient tolerated treatment well Patient left: in chair;with call bell/phone within reach Nurse Communication: Mobility status    Mertie Clause, SPTA 03/15/2012, 12:08 PM

## 2012-03-15 NOTE — Care Management Note (Signed)
    Page 1 of 1   03/15/2012     12:42:56 PM   CARE MANAGEMENT NOTE 03/15/2012  Patient:  [name], [name]   Account Number:  [id number]  Date Initiated:  03/15/2012  Documentation initiated by:  [facility]  Subjective/Objective Assessment:   Admitted postop L4-5 xlif.Lives with spouse.     Action/Plan:   PT/OT evals-recommended rolling walker   Anticipated DC Date:  03/15/2012   Anticipated DC Plan:  HOME/SELF CARE      DC Planning Services  CM consult      Choice offered to / List presented to:     DME arranged  Levan Hurst      DME agency  [facility]        Status of service:  Completed, signed off Medicare Important Message given?   (If response is "NO", the following Medicare IM given date fields will be blank) Date Medicare IM given:   Date Additional Medicare IM given:    Discharge Disposition:  HOME/SELF CARE  Per UR Regulation:  Reviewed for med. necessity/level of care/duration of stay  If discussed at Long Length of Stay Meetings, dates discussed:    Comments:  03/15/12 Spoke with patient about equipment needs, he has a 3N1 but will need a rolling walker. Rolling walker delivered to patient's room prior to discharge.Jacquelynn Cree RN, BSN, CCM

## 2012-04-06 NOTE — Progress Notes (Signed)
Physical Therapy Treatment Patient Details Name: [name] MRN: [id number] DOB: [birth year] Today's Date: 03/15/2012 Time: 9147-8295 PT Time Calculation (min): 21 min  PT Assessment / Plan / Recommendation Comments on Treatment Session  Pt. ambulated with RW today, min guard progressing to supervision in hallway. Pt. still presents with some decreased safety awareness & requiring cueing to reinforce back precautions with mobility.  Pt may benefit from safety evaluation/HHPT in the home for further assessment in the home. Pt. stated his wife just had surgery and will be able to tell him what to do but unable to provide limited physical (A). Will discuss changes for D/C with a PT.    Follow Up Recommendations  Home health PT     Does the patient have the potential to tolerate intense rehabilitation     Barriers to Discharge        Equipment Recommendations  Rolling walker with 5" wheels    Recommendations for Other Services    Frequency Min 5X/week   Plan Discharge plan needs to be updated    Precautions / Restrictions Precautions Precautions: Back Precaution Comments: Pt required cuing for "no twisting" Required Braces or Orthoses: Spinal Brace Spinal Brace: Lumbar corset;Applied in sitting position Restrictions Weight Bearing Restrictions: No   Pertinent Vitals/Pain Patient did not report of any pain during PT session    Mobility  Bed Mobility Bed Mobility: Rolling Left;Left Sidelying to Sit;Sitting - Scoot to [place] of Bed;Sit to Sidelying Left Rolling Left: 5: Supervision Left Sidelying to Sit: 4: Min guard;HOB flat Sitting - Scoot to Edge of Bed: 4: Min guard Sit to Sidelying Left: 4: Min guard;HOB flat Details for Bed Mobility Assistance: Min guard for safety and guarding while pt. getting to sitting position at EOB. Pt. given VC for proper technique with bed mobility and still presents with some signs of poor safety awareness with back precautions (sit>supine  without logrolling). Education on why log rolling is appropriate method. VC for rolling as one unit vs. segmentally. Transfers Transfers: Sit to Stand;Stand to Sit Sit to Stand: 5: Supervision;With upper extremity assist;From bed;From chair/3-in-1;With armrests Stand to Sit: 5: Supervision;With upper extremity assist;To bed;To chair/3-in-1;With armrests Details for Transfer Assistance: VC for not standing without RW in front of him. Pt. supervision with standing for safety and given VC for proper hand placement during standing to and sitting from RW. Pt. educated in following the back precautions during transfers. Ambulation/Gait Ambulation/Gait Assistance: 4: Min guard;5: Supervision Ambulation Distance (Feet): 500 Feet (500+) Assistive device: Rolling walker Ambulation/Gait Assistance Details: Cues for using RW safely and to stay close to RW. Min guard for safety and supervision at times on clear hallways without congestion/obstacles.  Gait Pattern: Step-through pattern;Decreased stride length Gait velocity: slow gait speed Stairs: Yes Stairs Assistance: 5: Supervision Stairs Assistance Details (indicate cue type and reason): Supervision for safety but pt able to return demonstration of technique from previous PT session. Both rails used to imitate door frame in the patients home. Stair Management Technique: Two rails Number of Stairs: 3  Wheelchair Mobility Wheelchair Mobility: No      PT Goals Acute Rehab PT Goals PT Goal Formulation: With patient Time For Goal Achievement: 03/19/12 Potential to Achieve Goals: Good Pt will go Supine/Side to Sit: with modified independence PT Goal: Supine/Side to Sit - Progress: Progressing toward goal Pt will go Sit to Supine/Side: with modified independence PT Goal: Sit to Supine/Side - Progress: Not met Pt will go Sit to Stand: with modified independence PT  Goal: Sit to Stand - Progress: Not met Pt will go Stand to Sit: with modified  independence PT Goal: Stand to Sit - Progress: Not met Pt will Transfer Bed to Chair/Chair to Bed: with modified independence Pt will Ambulate: >150 feet;with modified independence;with least restrictive assistive device PT Goal: Ambulate - Progress: Progressing toward goal Pt will Go Up / Down Stairs: 1-2 stairs;with supervision PT Goal: Up/Down Stairs - Progress: Progressing toward goal  Visit Information  Last PT Received On: 03/15/12 Assistance Needed: +1    Subjective Data  Subjective: "I'm feeling fine" Patient Stated Goal: to be back to normal   Cognition  Overall Cognitive Status: Appears within functional limits for tasks assessed/performed Arousal/Alertness: Awake/alert Orientation Level: Appears intact for tasks assessed Behavior During Session: [facility] for tasks performed    Balance  Balance Balance Assessed: No  End of Session PT - End of Session Equipment Utilized During Treatment: Gait belt;Back brace Activity Tolerance: Patient tolerated treatment well Patient left: in chair;with call bell/phone within reach Nurse Communication: Mobility status    Mertie Clause, SPTA 03/15/2012, 12:08 PM  Agree with above assessment.  Lewis Shock, PT, DPT Pager #: [phone] Office #: [phone]

## 2014-05-01 ENCOUNTER — Ambulatory Visit (INDEPENDENT_AMBULATORY_CARE_PROVIDER_SITE_OTHER): Payer: Self-pay | Admitting: General Surgery

## 2014-05-17 ENCOUNTER — Encounter (HOSPITAL_COMMUNITY)
Admission: RE | Admit: 2014-05-17 | Discharge: 2014-05-17 | Disposition: A | Discharge status: Home / Self Care | Payer: Medicare HMO | Source: Ambulatory Visit | Attending: General Surgery | Admitting: General Surgery

## 2014-05-17 ENCOUNTER — Encounter (HOSPITAL_COMMUNITY): Payer: Self-pay

## 2014-05-17 DIAGNOSIS — K4091 Unilateral inguinal hernia, without obstruction or gangrene, recurrent: Secondary | ICD-10-CM | POA: Diagnosis not present

## 2014-05-17 DIAGNOSIS — Z01812 Encounter for preprocedural laboratory examination: Secondary | ICD-10-CM | POA: Diagnosis present

## 2014-05-17 DIAGNOSIS — Z01810 Encounter for preprocedural cardiovascular examination: Secondary | ICD-10-CM | POA: Diagnosis not present

## 2014-05-17 DIAGNOSIS — Z01818 Encounter for other preprocedural examination: Secondary | ICD-10-CM | POA: Insufficient documentation

## 2014-05-17 HISTORY — DX: Spontaneous ecchymoses: R23.3

## 2014-05-17 HISTORY — DX: Other skin changes: R23.8

## 2014-05-17 HISTORY — DX: Other complications of anesthesia, initial encounter: T88.59XA

## 2014-05-17 HISTORY — DX: Adverse effect of unspecified anesthetic, initial encounter: T41.45XA

## 2014-05-17 LAB — CBC WITH DIFFERENTIAL/PLATELET
BASOS PCT: 1 % (ref 0–1)
Basophils Absolute: 0 10*3/uL (ref 0.0–0.1)
EOS PCT: 3 % (ref 0–5)
Eosinophils Absolute: 0.1 10*3/uL (ref 0.0–0.7)
HCT: 40.9 % (ref 39.0–52.0)
HEMOGLOBIN: 14.1 g/dL (ref 13.0–17.0)
LYMPHS ABS: 0.7 10*3/uL (ref 0.7–4.0)
LYMPHS PCT: 16 % (ref 12–46)
MCH: 32.2 pg (ref 26.0–34.0)
MCHC: 34.5 g/dL (ref 30.0–36.0)
MCV: 93.4 fL (ref 78.0–100.0)
MONOS PCT: 6 % (ref 3–12)
Monocytes Absolute: 0.3 10*3/uL (ref 0.1–1.0)
NEUTROS ABS: 3.4 10*3/uL (ref 1.7–7.7)
Neutrophils Relative %: 74 % (ref 43–77)
Platelets: 169 10*3/uL (ref 150–400)
RBC: 4.38 MIL/uL (ref 4.22–5.81)
RDW: 12.1 % (ref 11.5–15.5)
WBC: 4.6 10*3/uL (ref 4.0–10.5)

## 2014-05-17 LAB — COMPREHENSIVE METABOLIC PANEL
ALBUMIN: 3.8 g/dL (ref 3.5–5.2)
ALT: 15 U/L (ref 0–53)
AST: 22 U/L (ref 0–37)
Alkaline Phosphatase: 66 U/L (ref 39–117)
Anion gap: 13 (ref 5–15)
BUN: 10 mg/dL (ref 6–23)
CALCIUM: 9.4 mg/dL (ref 8.4–10.5)
CHLORIDE: 100 meq/L (ref 96–112)
CO2: 30 mmol/L (ref 19–32)
CREATININE: 1.11 mg/dL (ref 0.50–1.35)
GFR calc Af Amer: 74 mL/min — ABNORMAL LOW (ref >90)
GFR calc non Af Amer: 64 mL/min — ABNORMAL LOW (ref >90)
GLUCOSE: 138 mg/dL — AB (ref 70–99)
POTASSIUM: 3.5 mmol/L (ref 3.5–5.1)
Sodium: 143 mmol/L (ref 135–145)
Total Bilirubin: 0.9 mg/dL (ref 0.3–1.2)
Total Protein: 6.2 g/dL (ref 6.0–8.3)

## 2014-05-21 MED ORDER — CEFAZOLIN SODIUM-DEXTROSE 2-3 GM-% IV SOLR
2.0000 g | INTRAVENOUS | Status: AC
  Administered 2014-05-22: 2 g via INTRAVENOUS
  Filled 2014-05-21: qty 50

## 2014-05-21 NOTE — Anesthesia Preprocedure Evaluation (Addendum)
Anesthesia Evaluation  Patient identified by MRN, date of birth, ID band Patient awake    Reviewed: Allergy & Precautions, NPO status , Patient's Chart, lab work & pertinent test results  Airway Mallampati: II  TM Distance: >3 FB Neck ROM: Full    Dental  (+) Teeth Intact, Dental Advisory Given, Caps   Pulmonary former smoker,  breath sounds clear to auscultation        Cardiovascular hypertension, Pt. on medications Rhythm:Regular  EKG ST changes no change since 2013; Pt has a catapres patch on shoulder.   Neuro/Psych Anxiety    GI/Hepatic GERD-  Medicated,  Endo/Other    Renal/GU      Musculoskeletal   Abdominal (+)  Abdomen: soft.    Peds  Hematology   Anesthesia Other Findings Caps and bridgework across front, multi unit  Reproductive/Obstetrics                          Anesthesia Physical Anesthesia Plan  ASA: III  Anesthesia Plan: General   Post-op Pain Management:    Induction: Intravenous  Airway Management Planned: Oral ETT  Additional Equipment:   Intra-op Plan:   Post-operative Plan: Extubation in OR  Informed Consent: I have reviewed the patients History and Physical, chart, labs and discussed the procedure including the risks, benefits and alternatives for the proposed anesthesia with the patient or authorized representative who has indicated his/her understanding and acceptance.     Plan Discussed with:   Anesthesia Plan Comments: (Will offer TAP block if time permits)        Anesthesia Quick Evaluation

## 2014-05-22 ENCOUNTER — Ambulatory Visit (HOSPITAL_COMMUNITY): Payer: Medicare HMO | Admitting: Anesthesiology

## 2014-05-22 ENCOUNTER — Encounter (HOSPITAL_COMMUNITY): Payer: Self-pay | Admitting: *Deleted

## 2014-05-22 ENCOUNTER — Encounter (HOSPITAL_COMMUNITY)
Admission: RE | Disposition: A | Discharge status: Home / Self Care | Payer: Self-pay | Source: Ambulatory Visit | Attending: General Surgery

## 2014-05-22 ENCOUNTER — Ambulatory Visit (HOSPITAL_COMMUNITY)
Admission: RE | Admit: 2014-05-22 | Discharge: 2014-05-22 | Disposition: A | Discharge status: Home / Self Care | Payer: Medicare HMO | Source: Ambulatory Visit | Attending: General Surgery | Admitting: General Surgery

## 2014-05-22 DIAGNOSIS — K4091 Unilateral inguinal hernia, without obstruction or gangrene, recurrent: Secondary | ICD-10-CM | POA: Insufficient documentation

## 2014-05-22 DIAGNOSIS — Z87891 Personal history of nicotine dependence: Secondary | ICD-10-CM | POA: Diagnosis not present

## 2014-05-22 DIAGNOSIS — I10 Essential (primary) hypertension: Secondary | ICD-10-CM | POA: Diagnosis not present

## 2014-05-22 DIAGNOSIS — Z79899 Other long term (current) drug therapy: Secondary | ICD-10-CM | POA: Diagnosis not present

## 2014-05-22 DIAGNOSIS — F419 Anxiety disorder, unspecified: Secondary | ICD-10-CM | POA: Insufficient documentation

## 2014-05-22 DIAGNOSIS — Z885 Allergy status to narcotic agent status: Secondary | ICD-10-CM | POA: Diagnosis not present

## 2014-05-22 HISTORY — PX: INGUINAL HERNIA REPAIR: SHX194

## 2014-05-22 HISTORY — PX: INSERTION OF MESH: SHX5868

## 2014-05-22 SURGERY — REPAIR, HERNIA, INGUINAL, ADULT
Anesthesia: General | Site: Groin | Laterality: Right

## 2014-05-22 MED ORDER — PROPOFOL 10 MG/ML IV BOLUS
INTRAVENOUS | Status: AC
  Filled 2014-05-22: qty 20

## 2014-05-22 MED ORDER — FENTANYL CITRATE 0.05 MG/ML IJ SOLN
25.0000 ug | INTRAMUSCULAR | Status: DC | PRN
  Administered 2014-05-22 (×2): 25 ug via INTRAVENOUS
  Administered 2014-05-22: 50 ug via INTRAVENOUS

## 2014-05-22 MED ORDER — HYDROCODONE-ACETAMINOPHEN 5-325 MG PO TABS: 1.0000 | ORAL_TABLET | ORAL | Status: DC | PRN

## 2014-05-22 MED ORDER — LIDOCAINE HCL (CARDIAC) 20 MG/ML IV SOLN
INTRAVENOUS | Status: DC | PRN
  Administered 2014-05-22: 100 mg via INTRAVENOUS

## 2014-05-22 MED ORDER — FENTANYL CITRATE 0.05 MG/ML IJ SOLN
INTRAMUSCULAR | Status: AC
  Filled 2014-05-22: qty 5

## 2014-05-22 MED ORDER — MIDAZOLAM HCL 2 MG/2ML IJ SOLN
INTRAMUSCULAR | Status: AC
  Filled 2014-05-22: qty 2

## 2014-05-22 MED ORDER — GLYCOPYRROLATE 0.2 MG/ML IJ SOLN
INTRAMUSCULAR | Status: AC
  Filled 2014-05-22: qty 3

## 2014-05-22 MED ORDER — SODIUM CHLORIDE 0.9 % IJ SOLN
INTRAMUSCULAR | Status: AC
  Filled 2014-05-22: qty 10

## 2014-05-22 MED ORDER — GLYCOPYRROLATE 0.2 MG/ML IJ SOLN
INTRAMUSCULAR | Status: DC | PRN
  Administered 2014-05-22: 0.6 mg via INTRAVENOUS

## 2014-05-22 MED ORDER — NEOSTIGMINE METHYLSULFATE 10 MG/10ML IV SOLN
INTRAVENOUS | Status: AC
  Filled 2014-05-22: qty 1

## 2014-05-22 MED ORDER — DEXTROSE 5 % IV SOLN
INTRAVENOUS | Status: DC | PRN
  Administered 2014-05-22: 08:00:00 via INTRAVENOUS

## 2014-05-22 MED ORDER — 0.9 % SODIUM CHLORIDE (POUR BTL) OPTIME
TOPICAL | Status: DC | PRN
  Administered 2014-05-22: 1000 mL

## 2014-05-22 MED ORDER — MEPERIDINE HCL 25 MG/ML IJ SOLN: 6.2500 mg | INTRAMUSCULAR | Status: DC | PRN

## 2014-05-22 MED ORDER — BUPIVACAINE HCL (PF) 0.25 % IJ SOLN
INTRAMUSCULAR | Status: AC
  Filled 2014-05-22: qty 30

## 2014-05-22 MED ORDER — POLYMYXIN B SULFATE 500000 UNITS IJ SOLR
INTRAMUSCULAR | Status: DC | PRN
  Administered 2014-05-22: 500 mL

## 2014-05-22 MED ORDER — ROCURONIUM BROMIDE 100 MG/10ML IV SOLN
INTRAVENOUS | Status: DC | PRN
  Administered 2014-05-22: 35 mg via INTRAVENOUS

## 2014-05-22 MED ORDER — PROPOFOL 10 MG/ML IV BOLUS
INTRAVENOUS | Status: DC | PRN
  Administered 2014-05-22: 110 mg via INTRAVENOUS

## 2014-05-22 MED ORDER — LACTATED RINGERS IV SOLN
INTRAVENOUS | Status: DC | PRN
  Administered 2014-05-22 (×2): via INTRAVENOUS

## 2014-05-22 MED ORDER — NEOSTIGMINE METHYLSULFATE 10 MG/10ML IV SOLN
INTRAVENOUS | Status: DC | PRN
  Administered 2014-05-22: 4 mg via INTRAVENOUS

## 2014-05-22 MED ORDER — FENTANYL CITRATE 0.05 MG/ML IJ SOLN
INTRAMUSCULAR | Status: AC
  Filled 2014-05-22: qty 2

## 2014-05-22 MED ORDER — LIDOCAINE HCL (CARDIAC) 20 MG/ML IV SOLN
INTRAVENOUS | Status: AC
  Filled 2014-05-22: qty 5

## 2014-05-22 MED ORDER — HYDRALAZINE HCL 20 MG/ML IJ SOLN
INTRAMUSCULAR | Status: DC | PRN
  Administered 2014-05-22: 10 mg via INTRAVENOUS

## 2014-05-22 MED ORDER — HYDRALAZINE HCL 20 MG/ML IJ SOLN
INTRAMUSCULAR | Status: AC
  Filled 2014-05-22: qty 1

## 2014-05-22 MED ORDER — FENTANYL CITRATE 0.05 MG/ML IJ SOLN
INTRAMUSCULAR | Status: DC | PRN
  Administered 2014-05-22 (×2): 50 ug via INTRAVENOUS

## 2014-05-22 MED ORDER — ONDANSETRON HCL 4 MG/2ML IJ SOLN
INTRAMUSCULAR | Status: DC | PRN
  Administered 2014-05-22: 4 mg via INTRAVENOUS

## 2014-05-22 MED ORDER — ESMOLOL HCL 10 MG/ML IV SOLN
INTRAVENOUS | Status: AC
  Filled 2014-05-22: qty 10

## 2014-05-22 MED ORDER — EPHEDRINE SULFATE 50 MG/ML IJ SOLN
INTRAMUSCULAR | Status: DC | PRN
  Administered 2014-05-22 (×2): 5 mg via INTRAVENOUS

## 2014-05-22 MED ORDER — PHENYLEPHRINE 40 MCG/ML (10ML) SYRINGE FOR IV PUSH (FOR BLOOD PRESSURE SUPPORT)
PREFILLED_SYRINGE | INTRAVENOUS | Status: AC
  Filled 2014-05-22: qty 10

## 2014-05-22 MED ORDER — ESMOLOL HCL 10 MG/ML IV SOLN
INTRAVENOUS | Status: DC | PRN
  Administered 2014-05-22: 10 mg via INTRAVENOUS

## 2014-05-22 MED ORDER — BUPIVACAINE-EPINEPHRINE (PF) 0.5% -1:200000 IJ SOLN
INTRAMUSCULAR | Status: DC | PRN
  Administered 2014-05-22: 30 mL via PERINEURAL

## 2014-05-22 MED ORDER — PHENYLEPHRINE HCL 10 MG/ML IJ SOLN
INTRAMUSCULAR | Status: DC | PRN
  Administered 2014-05-22 (×10): 40 ug via INTRAVENOUS

## 2014-05-22 MED ORDER — CHLORHEXIDINE GLUCONATE 4 % EX LIQD
1.0000 "application " | Freq: Once | CUTANEOUS | Status: DC
  Filled 2014-05-22: qty 15

## 2014-05-22 MED ORDER — MIDAZOLAM HCL 5 MG/5ML IJ SOLN
INTRAMUSCULAR | Status: DC | PRN
  Administered 2014-05-22 (×2): 0.5 mg via INTRAVENOUS

## 2014-05-22 MED ORDER — ONDANSETRON HCL 4 MG/2ML IJ SOLN
INTRAMUSCULAR | Status: AC
  Filled 2014-05-22: qty 2

## 2014-05-22 MED ORDER — PROMETHAZINE HCL 25 MG/ML IJ SOLN: 6.2500 mg | INTRAMUSCULAR | Status: DC | PRN

## 2014-05-22 SURGICAL SUPPLY — 52 items
BAG DECANTER FOR FLEXI CONT (MISCELLANEOUS) ×2 IMPLANT
BLADE SURG 10 STRL SS (BLADE) ×1 IMPLANT
BLADE SURG 15 STRL LF DISP TIS (BLADE) IMPLANT
BLADE SURG 15 STRL SS (BLADE) ×2
BLADE SURG ROTATE 9660 (MISCELLANEOUS) ×1 IMPLANT
CANISTER SUCTION 2500CC (MISCELLANEOUS) IMPLANT
CHLORAPREP W/TINT 26ML (MISCELLANEOUS) ×2 IMPLANT
CLEANER TIP ELECTROSURG 2X2 (MISCELLANEOUS) ×2 IMPLANT
COVER SURGICAL LIGHT HANDLE (MISCELLANEOUS) ×2 IMPLANT
DRAIN PENROSE 1/2X12 LTX STRL (WOUND CARE) ×1 IMPLANT
DRAPE LAPAROTOMY TRNSV 102X78 (DRAPE) ×2 IMPLANT
DRAPE UTILITY XL STRL (DRAPES) ×4 IMPLANT
DRSG TEGADERM 4X4.75 (GAUZE/BANDAGES/DRESSINGS) ×2 IMPLANT
ELECT REM PT RETURN 9FT ADLT (ELECTROSURGICAL) ×2
ELECTRODE REM PT RTRN 9FT ADLT (ELECTROSURGICAL) ×1 IMPLANT
GAUZE SPONGE 4X4 16PLY XRAY LF (GAUZE/BANDAGES/DRESSINGS) ×2 IMPLANT
GLOVE BIO SURGEON STRL SZ7.5 (GLOVE) ×1 IMPLANT
GLOVE BIO SURGEON STRL SZ8 (GLOVE) ×1 IMPLANT
GLOVE BIOGEL PI IND STRL 6.5 (GLOVE) IMPLANT
GLOVE BIOGEL PI IND STRL 8 (GLOVE) ×1 IMPLANT
GLOVE BIOGEL PI INDICATOR 6.5 (GLOVE) ×1
GLOVE BIOGEL PI INDICATOR 8 (GLOVE) ×2
GLOVE ECLIPSE 7.5 STRL STRAW (GLOVE) ×2 IMPLANT
GLOVE SURG SS PI 6.5 STRL IVOR (GLOVE) ×2 IMPLANT
GOWN STRL REUS W/ TWL LRG LVL3 (GOWN DISPOSABLE) ×2 IMPLANT
GOWN STRL REUS W/TWL LRG LVL3 (GOWN DISPOSABLE) ×4
KIT BASIN OR (CUSTOM PROCEDURE TRAY) ×2 IMPLANT
KIT ROOM TURNOVER OR (KITS) ×2 IMPLANT
LIQUID BAND (GAUZE/BANDAGES/DRESSINGS) ×2 IMPLANT
MESH HERNIA 3X6 (Mesh General) ×1 IMPLANT
NDL HYPO 25GX1X1/2 BEV (NEEDLE) ×1 IMPLANT
NEEDLE HYPO 25GX1X1/2 BEV (NEEDLE) ×2 IMPLANT
NS IRRIG 1000ML POUR BTL (IV SOLUTION) ×2 IMPLANT
PACK SURGICAL SETUP 50X90 (CUSTOM PROCEDURE TRAY) ×2 IMPLANT
PAD ARMBOARD 7.5X6 YLW CONV (MISCELLANEOUS) ×2 IMPLANT
PENCIL BUTTON HOLSTER BLD 10FT (ELECTRODE) ×2 IMPLANT
SPECIMEN JAR SMALL (MISCELLANEOUS) IMPLANT
SPONGE INTESTINAL PEANUT (DISPOSABLE) ×2 IMPLANT
SPONGE LAP 18X18 X RAY DECT (DISPOSABLE) ×2 IMPLANT
STRIP CLOSURE SKIN 1/2X4 (GAUZE/BANDAGES/DRESSINGS) ×2 IMPLANT
SUT ETHIBOND 0 MO6 C/R (SUTURE) ×2 IMPLANT
SUT MON AB 4-0 PC3 18 (SUTURE) ×2 IMPLANT
SUT PROLENE 0 CT 2 (SUTURE) ×4 IMPLANT
SUT VIC AB 3-0 SH 27 (SUTURE) ×4
SUT VIC AB 3-0 SH 27X BRD (SUTURE) ×2 IMPLANT
SUT VICRYL AB 3 0 TIES (SUTURE) ×2 IMPLANT
SYR BULB 3OZ (MISCELLANEOUS) ×2 IMPLANT
SYR CONTROL 10ML LL (SYRINGE) ×2 IMPLANT
TOWEL OR 17X24 6PK STRL BLUE (TOWEL DISPOSABLE) ×2 IMPLANT
TOWEL OR 17X26 10 PK STRL BLUE (TOWEL DISPOSABLE) ×2 IMPLANT
TUBE CONNECTING 12X1/4 (SUCTIONS) IMPLANT
YANKAUER SUCT BULB TIP NO VENT (SUCTIONS) IMPLANT

## 2014-05-22 NOTE — Anesthesia Postprocedure Evaluation (Signed)
  Anesthesia Post-op Note  Patient: [name]  Procedure(s) Performed: Procedure(s): REPAIR RECURRENT RIGHT INGUINAL HERNIA (Right) INSERTION OF MESH (Right)  Patient Location: PACU  Anesthesia Type:General  Level of Consciousness: awake and alert   Airway and Oxygen Therapy: Patient Spontanous Breathing and Patient connected to nasal cannula oxygen  Post-op Pain: mild  Post-op Assessment: Post-op Vital signs reviewed, Patient's Cardiovascular Status Stable, Respiratory Function Stable, Patent Airway and No signs of Nausea or vomiting  Post-op Vital Signs: Reviewed and stable  Last Vitals:  Filed Vitals:   05/22/14 0908  BP: 147/80  Pulse: 79  Temp: 36.6 C  Resp: 13    Complications: No apparent anesthesia complications

## 2014-05-22 NOTE — Anesthesia Procedure Notes (Addendum)
Procedure Name: Intubation Date/Time: 05/22/2014 7:34 AM Performed by: Williemae Area B Pre-anesthesia Checklist: Patient identified, Emergency Drugs available, Suction available and Patient being monitored Patient Re-evaluated:Patient Re-evaluated prior to inductionOxygen Delivery Method: Circle system utilized Preoxygenation: Pre-oxygenation with 100% oxygen Intubation Type: IV induction Ventilation: Mask ventilation without difficulty Laryngoscope Size: Mac and 4 Grade View: Grade II Tube type: Oral Tube size: 7.5 mm Number of attempts: 1 Airway Equipment and Method: Stylet Secured at: 21 (cm at teeth) cm Tube secured with: Tape Dental Injury: Teeth and Oropharynx as per pre-operative assessment  Comments: R TAP block [place] 14ml .5% marcaine with epi, talked to patient throughout, no complications    Anesthesia Regional Block:  TAP block  Pre-Anesthetic Checklist: ,, timeout performed, Correct Patient, Correct Site, Correct Laterality, Correct Procedure, Correct Position, site marked, Risks and benefits discussed,  Surgical consent,  Pre-op evaluation,  At surgeon's request and post-op pain management   Prep: Maximum Sterile Barrier Precautions used and chloraprep       Needles:  Injection technique: Single-shot  Needle Type: Echogenic Stimulator Needle     Needle Length: 10cm 10 cm Needle Gauge: 21 and 21 G    Additional Needles:  Procedures: ultrasound guided (picture in chart) TAP block Narrative:  Start time: 05/22/2014 6:50 AM End time: 05/22/2014 7:00 AM Anesthesiologist: Alexis Frock

## 2014-05-22 NOTE — Interval H&P Note (Signed)
History and Physical Interval Note: Patient minimally symptomatic.  For Recurrent RIH repair.  Not sure shy repair failed.  05/22/2014 7:13 AM  [name]  has presented today for surgery, with the diagnosis of recurrent right inguinal hernia  The various methods of treatment have been discussed with the patient and family. After consideration of risks, benefits and other options for treatment, the patient has consented to  Procedure(s): REPAIR RECURRENT RIGHT INGUINAL HERNIA (Right) INSERTION OF MESH (Right) as a surgical intervention .  The patient's history has been reviewed, patient examined, no change in status, stable for surgery.  I have reviewed the patient's chart and labs.  Questions were answered to the patient's satisfaction.     [name], JAY

## 2014-05-22 NOTE — Transfer of Care (Signed)
Immediate Anesthesia Transfer of Care Note  Patient: [name]  Procedure(s) Performed: Procedure(s): REPAIR RECURRENT RIGHT INGUINAL HERNIA (Right) INSERTION OF MESH (Right)  Patient Location: PACU  Anesthesia Type:General  Level of Consciousness: awake, alert , oriented and patient cooperative  Airway & Oxygen Therapy: Patient Spontanous Breathing and Patient connected to nasal cannula oxygen  Post-op Assessment: Report given to PACU RN, Post -op Vital signs reviewed and stable and Patient moving all extremities  Post vital signs: Reviewed and stable  Complications: No apparent anesthesia complications

## 2014-05-22 NOTE — Op Note (Signed)
OPERATIVE REPORT  DATE OF OPERATION: 05/22/2014  PATIENT:  [name]  74 y.o. male  PRE-OPERATIVE DIAGNOSIS:  recurrent right inguinal hernia  POST-OPERATIVE DIAGNOSIS:  recurrent right inguinal hernia  PROCEDURE:  Procedure(s): REPAIR RECURRENT RIGHT INGUINAL HERNIA INSERTION OF MESH  SURGEON:  Surgeon(s): Doreen Salvage, MD  ASSISTANT: PA student Eveland  ANESTHESIA:   general  EBL: <20 ml  BLOOD ADMINISTERED: none  DRAINS: none   SPECIMEN:  No Specimen  COUNTS CORRECT:  YES  PROCEDURE DETAILS: The patient was taken to the operating room and placed on the table in the supine position. After an adequate general endotracheal anesthetic was administered he was prepped and draped in usual sterile manner exposing his right inguinal area which had been marked preoperatively by the surgeon.  A proper timeout was performed identifying the patient and the procedure to be performed. An incision was made through the patient's previous right lower quadrant inguinal incision from his previous repair approximately 3 years ago. It was taken down to the subcutaneous tissue down to the palpable hernia which was a direct recurrence CO2 the spermatic cord.  We dissected out the subcutaneous tissue from the external oblique fascia and we opened the fascia down to the re-created superficial ring.  The spermatic cord was isolated as the hernia sac came out medially and could be easily dissected away from surrounding structures. We excised the excess hernia sac and closed the defect using interrupted 0 Ethibond sutures. Peritoneum was exposed and adequately closed. The floor had to be re-created as the previous mesh which had been in place had dettachment from the conjoined tendon superiorly and medially. Another piece of mesh was attached to the previous mesh and the reflected portion of the inguinal ligament and the conjoined tendon and muscle using deep running stitches of 0 Prolene suture. We  attached the mesh also inferiorly to the mesh and the reflected portion of the inguinal ligament using running 0 Prolene suture. The mesh that was placed was folded on itself to close the defect. We reinforced the floor on top of the mesh using interrupted figure-of-eight stitches of 0 Ethibond suture. We irrigated and soaked mesh in antibiotic solution.  The defect was closed in the close the external oblique fascia on top of this using running 3-0 Vicryl suture. 3-0 Vicryl was also used to reapproximate Scarpa's fascia. The skin was then closed using running subcuticular stitch of 3-0 Monocryl. All needle counts sponge counts and instrument counts were correct. A TA P block and then placed prior to surgery and therefore no local anesthetic was administered.  PATIENT DISPOSITION:  PACU - hemodynamically stable.   [name], JAY 1/19/20168:48 AM

## 2014-05-22 NOTE — Discharge Instructions (Addendum)
Inguinal Hernia, Adult  Care After Refer to this sheet in the next few weeks. These discharge instructions provide you with general information on caring for yourself after you leave the hospital. Your caregiver may also give you specific instructions. Your treatment has been planned according to the most current medical practices available, but unavoidable complications sometimes occur. If you have any problems or questions after discharge, please call your caregiver. HOME CARE INSTRUCTIONS  Put ice on the operative site.  Put ice in a plastic bag.  Place a towel between your skin and the bag.  Leave the ice on for 15-20 minutes at a time, 03-04 times a day while awake.  Change bandages (dressings) as directed.  Keep the wound dry and clean. The wound may be washed gently with soap and water. Gently blot or dab the wound dry. It is okay to take showers 24 to 48 hours after surgery. Do not take baths, use swimming pools, or use hot tubs for 10 days, or as directed by your caregiver.  Only take over-the-counter or prescription medicines for pain, discomfort, or fever as directed by your caregiver.  Continue your normal diet as directed.  Do not lift anything more than 10 pounds or play contact sports for 3 weeks, or as directed. SEEK MEDICAL CARE IF:  There is redness, swelling, or increasing pain in the wound.  There is fluid (pus) coming from the wound.  There is drainage from a wound lasting longer than 1 day.  You have an oral temperature above 102 F (38.9 C).  You notice a bad smell coming from the wound or dressing.  The wound breaks open after the stitches (sutures) have been removed.  You notice increasing pain in the shoulders (shoulder strap areas).  You develop dizzy episodes or fainting while standing.  You feel sick to your stomach (nauseous) or throw up (vomit). SEEK IMMEDIATE MEDICAL CARE IF:  You develop a rash.  You have difficulty breathing.  You  develop a reaction or have side effects to medicines you were given. MAKE SURE YOU:   Understand these instructions.  Will watch your condition.  Will get help right away if you are not doing well or get worse. Document Released: 05/21/2006 Document Revised: 07/13/2011 Document Reviewed: 03/20/2009 [facility] Patient Information 2015 [place], [place]. This information is not intended to replace advice given to you by your health care provider. Make sure you discuss any questions you have with your health care provider.   What to eat:  For your first meals, you should eat lightly; only small meals initially.  If you do not have nausea, you may eat larger meals.  Avoid spicy, greasy and heavy food.    General Anesthesia, Adult, Care After  Refer to this sheet in the next few weeks. These instructions provide you with information on caring for yourself after your procedure. Your health care provider may also give you more specific instructions. Your treatment has been planned according to current medical practices, but problems sometimes occur. Call your health care provider if you have any problems or questions after your procedure.  WHAT TO EXPECT AFTER THE PROCEDURE  After the procedure, it is typical to experience:  Sleepiness.  Nausea and vomiting. HOME CARE INSTRUCTIONS  For the first 24 hours after general anesthesia:  Have a responsible person with you.  Do not drive a car. If you are alone, do not take public transportation.  Do not drink alcohol.  Do not take medicine that  has not been prescribed by your health care provider.  Do not sign important papers or make important decisions.  You may resume a normal diet and activities as directed by your health care provider.  Change bandages (dressings) as directed.  If you have questions or problems that seem related to general anesthesia, call the hospital and ask for the anesthetist or anesthesiologist on call. SEEK MEDICAL CARE IF:   You have nausea and vomiting that continue the day after anesthesia.  You develop a rash. SEEK IMMEDIATE MEDICAL CARE IF:  You have difficulty breathing.  You have chest pain.  You have any allergic problems. Document Released: 07/27/2000 Document Revised: 12/21/2012 Document Reviewed: 11/03/2012  [facility] Patient Information 2014 [place], [place].

## 2014-05-22 NOTE — H&P (Signed)
  [name] 05/01/2014 11:00 AM Location: [facility] Patient #: [id number] DOB: 1940-08-29 Married / Language: Derek Molt / Race: White Male  History of Present Illness [name] CMA; 05/01/2014 11:02 AM) Patient words: abd hernia.  The patient is a 74 year old male    Other Problems [name], [name]; 05/01/2014 11:00 AM) Back Pain Cancer  Past Surgical History [name], [name]; 05/01/2014 11:00 AM) Oral Surgery [name] Shoulder Surgery Bilateral. Spinal Surgery - Lower Back  Allergies [name], CMA; 05/01/2014 11:03 AM) Codeine/Codeine Derivatives  Medication History [name], CMA; 05/01/2014 11:03 AM) Amoxicillin (500MG  Capsule, Oral) Active. CloNIDine HCl (0.2MG /24HR Patch Weekly, Transdermal) Active. Dorzolamide HCl-Timolol Mal (22.3-6.8MG /ML Solution, Ophthalmic) Active. Fluticasone Propionate (50MCG/ACT Suspension, Nasal) Active. Ipratropium Bromide (0.03% Solution, Nasal) Active. Levothyroxine Sodium (50MCG Tablet, Oral) Active. Omeprazole (20MG  Capsule DR, Oral) Active. Zioptan (0.0015% Solution, Ophthalmic) Active. Simvastatin (20MG  Tablet, Oral) Active.  Social History [name], [name]; 05/01/2014 11:00 AM) Alcohol use Occasional alcohol use. Caffeine use Coffee. No drug use Tobacco use Former smoker.  Family History [name], [name]; 05/01/2014 11:00 AM) First Degree Relatives No pertinent family history  Review of Systems [name] CMA; 05/01/2014 11:00 AM) General Not Present- Appetite Loss, Chills, Fatigue, Fever, Night Sweats, Weight Gain and Weight Loss. Skin Not Present- Change in Wart/Mole, Dryness, Hives, Jaundice, New Lesions, Non-Healing Wounds, Rash and Ulcer. HEENT Present- Hearing Loss and Ringing in the Ears. Not Present- Earache, Hoarseness, Nose Bleed, Oral Ulcers, Seasonal Allergies, Sinus Pain, Sore Throat, Visual Disturbances, Wears  glasses/contact lenses and Yellow Eyes. Respiratory Present- Snoring. Not Present- Bloody sputum, Chronic Cough, Difficulty Breathing and Wheezing. Male Genitourinary Present- Impotence. Not Present- Blood in Urine, Change in Urinary Stream, Frequency, Nocturia, Painful Urination, Urgency and Urine Leakage. Musculoskeletal Present- Back Pain. Not Present- Joint Pain, Joint Stiffness, Muscle Pain, Muscle Weakness and Swelling of Extremities. Neurological Not Present- Decreased Memory, Fainting, Headaches, Numbness, Seizures, Tingling, Tremor, Trouble walking and Weakness. Psychiatric Present- Anxiety. Not Present- Bipolar, Change in Sleep Pattern, Depression, Fearful and Frequent crying. Hematology Present- Easy Bruising. Not Present- Excessive bleeding, Gland problems, HIV and Persistent Infections.   Vitals [name] CMA; 05/01/2014 11:04 AM) 05/01/2014 11:03 AM Weight: 169.38 lb Height: 69in Body Surface Area: 1.93 m Body Mass Index: 25.01 kg/m Temp.: 29F  Pulse: 89 (Regular)  BP: 170/102 (Sitting, Left Arm, Standard)    Physical Exam ([name] O. Derek Skains MD; 05/01/2014 11:19 AM) General General Appearance-Cooperative. Orientation-Oriented X4. Build & Nutrition-Asthenic. Posture-Normal posture.  Chest and Lung Exam Inspection Chest Wall - Normal. Palpation Palpation of the chest reveals - Non-tender. Auscultation Breath sounds - Normal.  Cardiovascular Cardiovascular examination reveals -on palpation PMI is normal in location and amplitude, no palpable S3 or S4. Normal cardiac borders.. Auscultation Murmurs & Other Heart Sounds - Auscultation of the heart reveals - No Murmurs.  Abdomen Inspection Hernias - Inguinal hernia - Right - Reducible(Recurrent).    Assessment & Plan [name] O. 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  wants repair electively UNILATERAL RECURRENT INGUINAL HERNIA WITHOUT OBSTRUCTION OR GANGRENE (550.91  K40.91)     Signed by Derek Ober, MD (05/01/2014 11:23 AM)

## 2014-05-23 ENCOUNTER — Encounter (HOSPITAL_COMMUNITY): Payer: Self-pay | Admitting: General Surgery

## 2015-05-08 DIAGNOSIS — H60542 Acute eczematoid otitis externa, left ear: Secondary | ICD-10-CM | POA: Diagnosis not present

## 2015-05-20 DIAGNOSIS — I10 Essential (primary) hypertension: Secondary | ICD-10-CM | POA: Diagnosis not present

## 2015-06-05 DIAGNOSIS — H60542 Acute eczematoid otitis externa, left ear: Secondary | ICD-10-CM | POA: Diagnosis not present

## 2015-06-06 DIAGNOSIS — R69 Illness, unspecified: Secondary | ICD-10-CM | POA: Diagnosis not present

## 2015-06-06 DIAGNOSIS — I10 Essential (primary) hypertension: Secondary | ICD-10-CM | POA: Diagnosis not present

## 2015-06-06 DIAGNOSIS — E785 Hyperlipidemia, unspecified: Secondary | ICD-10-CM | POA: Diagnosis not present

## 2015-06-06 DIAGNOSIS — H4010X0 Unspecified open-angle glaucoma, stage unspecified: Secondary | ICD-10-CM | POA: Diagnosis not present

## 2015-06-06 DIAGNOSIS — E039 Hypothyroidism, unspecified: Secondary | ICD-10-CM | POA: Diagnosis not present

## 2015-06-12 DIAGNOSIS — H401132 Primary open-angle glaucoma, bilateral, moderate stage: Secondary | ICD-10-CM | POA: Diagnosis not present

## 2015-06-12 DIAGNOSIS — H2511 Age-related nuclear cataract, right eye: Secondary | ICD-10-CM | POA: Diagnosis not present

## 2015-06-12 DIAGNOSIS — Z961 Presence of intraocular lens: Secondary | ICD-10-CM | POA: Diagnosis not present

## 2015-07-16 DIAGNOSIS — M542 Cervicalgia: Secondary | ICD-10-CM | POA: Diagnosis not present

## 2015-07-16 DIAGNOSIS — M5022 Other cervical disc displacement, mid-cervical region, unspecified level: Secondary | ICD-10-CM | POA: Diagnosis not present

## 2015-07-19 DIAGNOSIS — H60542 Acute eczematoid otitis externa, left ear: Secondary | ICD-10-CM | POA: Diagnosis not present

## 2015-07-31 DIAGNOSIS — L821 Other seborrheic keratosis: Secondary | ICD-10-CM | POA: Diagnosis not present

## 2015-07-31 DIAGNOSIS — L570 Actinic keratosis: Secondary | ICD-10-CM | POA: Diagnosis not present

## 2015-07-31 DIAGNOSIS — Z85828 Personal history of other malignant neoplasm of skin: Secondary | ICD-10-CM | POA: Diagnosis not present

## 2015-07-31 DIAGNOSIS — L820 Inflamed seborrheic keratosis: Secondary | ICD-10-CM | POA: Diagnosis not present

## 2015-07-31 DIAGNOSIS — D044 Carcinoma in situ of skin of scalp and neck: Secondary | ICD-10-CM | POA: Diagnosis not present

## 2015-07-31 DIAGNOSIS — D485 Neoplasm of uncertain behavior of skin: Secondary | ICD-10-CM | POA: Diagnosis not present

## 2015-07-31 DIAGNOSIS — C44329 Squamous cell carcinoma of skin of other parts of face: Secondary | ICD-10-CM | POA: Diagnosis not present

## 2015-07-31 DIAGNOSIS — C44321 Squamous cell carcinoma of skin of nose: Secondary | ICD-10-CM | POA: Diagnosis not present

## 2015-08-30 DIAGNOSIS — E039 Hypothyroidism, unspecified: Secondary | ICD-10-CM | POA: Insufficient documentation

## 2015-08-30 DIAGNOSIS — Z85810 Personal history of malignant neoplasm of tongue: Secondary | ICD-10-CM | POA: Insufficient documentation

## 2015-08-30 DIAGNOSIS — I10 Essential (primary) hypertension: Secondary | ICD-10-CM | POA: Diagnosis not present

## 2015-08-30 DIAGNOSIS — Z8521 Personal history of malignant neoplasm of larynx: Secondary | ICD-10-CM | POA: Insufficient documentation

## 2015-08-30 DIAGNOSIS — R69 Illness, unspecified: Secondary | ICD-10-CM | POA: Diagnosis not present

## 2015-08-30 DIAGNOSIS — E7800 Pure hypercholesterolemia, unspecified: Secondary | ICD-10-CM | POA: Insufficient documentation

## 2015-08-30 DIAGNOSIS — E785 Hyperlipidemia, unspecified: Secondary | ICD-10-CM | POA: Insufficient documentation

## 2015-09-21 DIAGNOSIS — M542 Cervicalgia: Secondary | ICD-10-CM | POA: Diagnosis not present

## 2015-09-26 DIAGNOSIS — M5022 Other cervical disc displacement, mid-cervical region, unspecified level: Secondary | ICD-10-CM | POA: Diagnosis not present

## 2015-09-26 DIAGNOSIS — M542 Cervicalgia: Secondary | ICD-10-CM | POA: Diagnosis not present

## 2015-10-02 DIAGNOSIS — M50322 Other cervical disc degeneration at C5-C6 level: Secondary | ICD-10-CM | POA: Diagnosis not present

## 2015-10-02 DIAGNOSIS — M542 Cervicalgia: Secondary | ICD-10-CM | POA: Diagnosis not present

## 2015-10-07 DIAGNOSIS — M542 Cervicalgia: Secondary | ICD-10-CM | POA: Diagnosis not present

## 2015-10-16 DIAGNOSIS — M542 Cervicalgia: Secondary | ICD-10-CM | POA: Diagnosis not present

## 2015-10-22 DIAGNOSIS — M542 Cervicalgia: Secondary | ICD-10-CM | POA: Diagnosis not present

## 2015-10-24 DIAGNOSIS — H2511 Age-related nuclear cataract, right eye: Secondary | ICD-10-CM | POA: Diagnosis not present

## 2015-10-24 DIAGNOSIS — Z961 Presence of intraocular lens: Secondary | ICD-10-CM | POA: Diagnosis not present

## 2015-10-24 DIAGNOSIS — H401132 Primary open-angle glaucoma, bilateral, moderate stage: Secondary | ICD-10-CM | POA: Diagnosis not present

## 2015-10-28 DIAGNOSIS — H60542 Acute eczematoid otitis externa, left ear: Secondary | ICD-10-CM | POA: Diagnosis not present

## 2015-10-29 DIAGNOSIS — M542 Cervicalgia: Secondary | ICD-10-CM | POA: Diagnosis not present

## 2015-10-30 DIAGNOSIS — M50322 Other cervical disc degeneration at C5-C6 level: Secondary | ICD-10-CM | POA: Diagnosis not present

## 2015-11-06 DIAGNOSIS — M542 Cervicalgia: Secondary | ICD-10-CM | POA: Diagnosis not present

## 2015-11-08 DIAGNOSIS — I10 Essential (primary) hypertension: Secondary | ICD-10-CM | POA: Diagnosis not present

## 2015-11-11 DIAGNOSIS — M542 Cervicalgia: Secondary | ICD-10-CM | POA: Diagnosis not present

## 2015-11-14 DIAGNOSIS — M722 Plantar fascial fibromatosis: Secondary | ICD-10-CM | POA: Diagnosis not present

## 2015-11-14 DIAGNOSIS — M6702 Short Achilles tendon (acquired), left ankle: Secondary | ICD-10-CM | POA: Diagnosis not present

## 2015-11-18 DIAGNOSIS — I10 Essential (primary) hypertension: Secondary | ICD-10-CM | POA: Insufficient documentation

## 2015-11-19 DIAGNOSIS — L821 Other seborrheic keratosis: Secondary | ICD-10-CM | POA: Diagnosis not present

## 2015-11-19 DIAGNOSIS — Z85828 Personal history of other malignant neoplasm of skin: Secondary | ICD-10-CM | POA: Diagnosis not present

## 2015-12-23 DIAGNOSIS — M5136 Other intervertebral disc degeneration, lumbar region: Secondary | ICD-10-CM | POA: Diagnosis not present

## 2015-12-23 DIAGNOSIS — M7062 Trochanteric bursitis, left hip: Secondary | ICD-10-CM | POA: Diagnosis not present

## 2016-03-03 DIAGNOSIS — Z23 Encounter for immunization: Secondary | ICD-10-CM | POA: Diagnosis not present

## 2016-03-03 DIAGNOSIS — R1031 Right lower quadrant pain: Secondary | ICD-10-CM | POA: Diagnosis not present

## 2016-03-03 DIAGNOSIS — E7800 Pure hypercholesterolemia, unspecified: Secondary | ICD-10-CM | POA: Diagnosis not present

## 2016-03-03 DIAGNOSIS — Z0000 Encounter for general adult medical examination without abnormal findings: Secondary | ICD-10-CM | POA: Diagnosis not present

## 2016-03-03 DIAGNOSIS — M8589 Other specified disorders of bone density and structure, multiple sites: Secondary | ICD-10-CM | POA: Diagnosis not present

## 2016-03-03 DIAGNOSIS — R69 Illness, unspecified: Secondary | ICD-10-CM | POA: Diagnosis not present

## 2016-03-03 DIAGNOSIS — E039 Hypothyroidism, unspecified: Secondary | ICD-10-CM | POA: Diagnosis not present

## 2016-03-03 DIAGNOSIS — I10 Essential (primary) hypertension: Secondary | ICD-10-CM | POA: Diagnosis not present

## 2016-03-05 DIAGNOSIS — M810 Age-related osteoporosis without current pathological fracture: Secondary | ICD-10-CM | POA: Diagnosis not present

## 2016-03-05 DIAGNOSIS — M8588 Other specified disorders of bone density and structure, other site: Secondary | ICD-10-CM | POA: Diagnosis not present

## 2016-03-25 DIAGNOSIS — C44319 Basal cell carcinoma of skin of other parts of face: Secondary | ICD-10-CM | POA: Diagnosis not present

## 2016-03-25 DIAGNOSIS — Z85828 Personal history of other malignant neoplasm of skin: Secondary | ICD-10-CM | POA: Diagnosis not present

## 2016-03-25 DIAGNOSIS — L821 Other seborrheic keratosis: Secondary | ICD-10-CM | POA: Diagnosis not present

## 2016-03-25 DIAGNOSIS — L942 Calcinosis cutis: Secondary | ICD-10-CM | POA: Diagnosis not present

## 2016-03-25 DIAGNOSIS — D485 Neoplasm of uncertain behavior of skin: Secondary | ICD-10-CM | POA: Diagnosis not present

## 2016-03-25 DIAGNOSIS — C44519 Basal cell carcinoma of skin of other part of trunk: Secondary | ICD-10-CM | POA: Diagnosis not present

## 2016-03-25 DIAGNOSIS — L570 Actinic keratosis: Secondary | ICD-10-CM | POA: Diagnosis not present

## 2016-03-25 DIAGNOSIS — D044 Carcinoma in situ of skin of scalp and neck: Secondary | ICD-10-CM | POA: Diagnosis not present

## 2016-03-25 DIAGNOSIS — C4441 Basal cell carcinoma of skin of scalp and neck: Secondary | ICD-10-CM | POA: Diagnosis not present

## 2016-03-25 DIAGNOSIS — L820 Inflamed seborrheic keratosis: Secondary | ICD-10-CM | POA: Diagnosis not present

## 2016-03-30 DIAGNOSIS — N401 Enlarged prostate with lower urinary tract symptoms: Secondary | ICD-10-CM | POA: Diagnosis not present

## 2016-03-30 DIAGNOSIS — N312 Flaccid neuropathic bladder, not elsewhere classified: Secondary | ICD-10-CM | POA: Diagnosis not present

## 2016-04-07 DIAGNOSIS — J0140 Acute pansinusitis, unspecified: Secondary | ICD-10-CM | POA: Diagnosis not present

## 2016-04-13 DIAGNOSIS — R040 Epistaxis: Secondary | ICD-10-CM | POA: Diagnosis not present

## 2016-04-13 DIAGNOSIS — H60542 Acute eczematoid otitis externa, left ear: Secondary | ICD-10-CM | POA: Diagnosis not present

## 2016-04-13 DIAGNOSIS — H6122 Impacted cerumen, left ear: Secondary | ICD-10-CM | POA: Diagnosis not present

## 2016-05-06 DIAGNOSIS — H33322 Round hole, left eye: Secondary | ICD-10-CM | POA: Diagnosis not present

## 2016-05-06 DIAGNOSIS — H26492 Other secondary cataract, left eye: Secondary | ICD-10-CM | POA: Diagnosis not present

## 2016-05-06 DIAGNOSIS — Z8679 Personal history of other diseases of the circulatory system: Secondary | ICD-10-CM | POA: Diagnosis not present

## 2016-05-06 DIAGNOSIS — H4089 Other specified glaucoma: Secondary | ICD-10-CM | POA: Diagnosis not present

## 2016-05-06 DIAGNOSIS — H401132 Primary open-angle glaucoma, bilateral, moderate stage: Secondary | ICD-10-CM | POA: Diagnosis not present

## 2016-05-06 DIAGNOSIS — H2511 Age-related nuclear cataract, right eye: Secondary | ICD-10-CM | POA: Diagnosis not present

## 2016-05-06 DIAGNOSIS — Z961 Presence of intraocular lens: Secondary | ICD-10-CM | POA: Diagnosis not present

## 2016-05-06 DIAGNOSIS — H401112 Primary open-angle glaucoma, right eye, moderate stage: Secondary | ICD-10-CM | POA: Insufficient documentation

## 2016-05-06 DIAGNOSIS — H5203 Hypermetropia, bilateral: Secondary | ICD-10-CM | POA: Diagnosis not present

## 2016-05-06 DIAGNOSIS — H04123 Dry eye syndrome of bilateral lacrimal glands: Secondary | ICD-10-CM | POA: Diagnosis not present

## 2016-05-06 DIAGNOSIS — H33312 Horseshoe tear of retina without detachment, left eye: Secondary | ICD-10-CM | POA: Diagnosis not present

## 2016-05-07 DIAGNOSIS — H5203 Hypermetropia, bilateral: Secondary | ICD-10-CM | POA: Diagnosis not present

## 2016-05-07 DIAGNOSIS — Z0100 Encounter for examination of eyes and vision without abnormal findings: Secondary | ICD-10-CM | POA: Diagnosis not present

## 2016-05-07 DIAGNOSIS — H524 Presbyopia: Secondary | ICD-10-CM | POA: Diagnosis not present

## 2016-05-07 DIAGNOSIS — H52209 Unspecified astigmatism, unspecified eye: Secondary | ICD-10-CM | POA: Diagnosis not present

## 2016-05-12 DIAGNOSIS — M542 Cervicalgia: Secondary | ICD-10-CM | POA: Diagnosis not present

## 2016-05-12 DIAGNOSIS — M50322 Other cervical disc degeneration at C5-C6 level: Secondary | ICD-10-CM | POA: Diagnosis not present

## 2016-05-18 DIAGNOSIS — I10 Essential (primary) hypertension: Secondary | ICD-10-CM | POA: Diagnosis not present

## 2016-05-21 DIAGNOSIS — I10 Essential (primary) hypertension: Secondary | ICD-10-CM | POA: Diagnosis not present

## 2016-05-27 DIAGNOSIS — E039 Hypothyroidism, unspecified: Secondary | ICD-10-CM | POA: Diagnosis not present

## 2016-05-27 DIAGNOSIS — E785 Hyperlipidemia, unspecified: Secondary | ICD-10-CM | POA: Diagnosis not present

## 2016-05-27 DIAGNOSIS — Z0000 Encounter for general adult medical examination without abnormal findings: Secondary | ICD-10-CM | POA: Diagnosis not present

## 2016-05-27 DIAGNOSIS — H9113 Presbycusis, bilateral: Secondary | ICD-10-CM | POA: Diagnosis not present

## 2016-05-27 DIAGNOSIS — Z6830 Body mass index (BMI) 30.0-30.9, adult: Secondary | ICD-10-CM | POA: Diagnosis not present

## 2016-05-27 DIAGNOSIS — Z974 Presence of external hearing-aid: Secondary | ICD-10-CM | POA: Diagnosis not present

## 2016-05-27 DIAGNOSIS — H8392 Unspecified disease of left inner ear: Secondary | ICD-10-CM | POA: Diagnosis not present

## 2016-05-27 DIAGNOSIS — I10 Essential (primary) hypertension: Secondary | ICD-10-CM | POA: Diagnosis not present

## 2016-05-27 DIAGNOSIS — H4010X0 Unspecified open-angle glaucoma, stage unspecified: Secondary | ICD-10-CM | POA: Diagnosis not present

## 2016-05-27 DIAGNOSIS — R69 Illness, unspecified: Secondary | ICD-10-CM | POA: Diagnosis not present

## 2016-06-02 DIAGNOSIS — M50322 Other cervical disc degeneration at C5-C6 level: Secondary | ICD-10-CM | POA: Diagnosis not present

## 2016-06-03 DIAGNOSIS — H6122 Impacted cerumen, left ear: Secondary | ICD-10-CM | POA: Diagnosis not present

## 2016-06-23 DIAGNOSIS — M542 Cervicalgia: Secondary | ICD-10-CM | POA: Diagnosis not present

## 2016-06-23 DIAGNOSIS — M50322 Other cervical disc degeneration at C5-C6 level: Secondary | ICD-10-CM | POA: Diagnosis not present

## 2016-06-26 DIAGNOSIS — M542 Cervicalgia: Secondary | ICD-10-CM | POA: Diagnosis not present

## 2016-06-26 DIAGNOSIS — M50322 Other cervical disc degeneration at C5-C6 level: Secondary | ICD-10-CM | POA: Diagnosis not present

## 2016-06-29 DIAGNOSIS — H60542 Acute eczematoid otitis externa, left ear: Secondary | ICD-10-CM | POA: Diagnosis not present

## 2016-06-29 DIAGNOSIS — H6122 Impacted cerumen, left ear: Secondary | ICD-10-CM | POA: Diagnosis not present

## 2016-07-04 DIAGNOSIS — M542 Cervicalgia: Secondary | ICD-10-CM | POA: Diagnosis not present

## 2016-07-04 DIAGNOSIS — M50322 Other cervical disc degeneration at C5-C6 level: Secondary | ICD-10-CM | POA: Diagnosis not present

## 2016-08-19 DIAGNOSIS — H6122 Impacted cerumen, left ear: Secondary | ICD-10-CM | POA: Diagnosis not present

## 2016-08-19 DIAGNOSIS — R070 Pain in throat: Secondary | ICD-10-CM | POA: Diagnosis not present

## 2016-08-19 DIAGNOSIS — H60542 Acute eczematoid otitis externa, left ear: Secondary | ICD-10-CM | POA: Diagnosis not present

## 2016-09-01 DIAGNOSIS — R69 Illness, unspecified: Secondary | ICD-10-CM | POA: Diagnosis not present

## 2016-09-01 DIAGNOSIS — I10 Essential (primary) hypertension: Secondary | ICD-10-CM | POA: Diagnosis not present

## 2016-09-03 DIAGNOSIS — Z23 Encounter for immunization: Secondary | ICD-10-CM | POA: Diagnosis not present

## 2016-09-18 DIAGNOSIS — I10 Essential (primary) hypertension: Secondary | ICD-10-CM | POA: Insufficient documentation

## 2016-09-18 DIAGNOSIS — R0989 Other specified symptoms and signs involving the circulatory and respiratory systems: Secondary | ICD-10-CM | POA: Insufficient documentation

## 2016-09-22 DIAGNOSIS — C44321 Squamous cell carcinoma of skin of nose: Secondary | ICD-10-CM | POA: Diagnosis not present

## 2016-09-22 DIAGNOSIS — Z85828 Personal history of other malignant neoplasm of skin: Secondary | ICD-10-CM | POA: Diagnosis not present

## 2016-09-22 DIAGNOSIS — D485 Neoplasm of uncertain behavior of skin: Secondary | ICD-10-CM | POA: Diagnosis not present

## 2016-09-22 DIAGNOSIS — L821 Other seborrheic keratosis: Secondary | ICD-10-CM | POA: Diagnosis not present

## 2016-09-22 DIAGNOSIS — L570 Actinic keratosis: Secondary | ICD-10-CM | POA: Diagnosis not present

## 2016-09-22 DIAGNOSIS — D692 Other nonthrombocytopenic purpura: Secondary | ICD-10-CM | POA: Diagnosis not present

## 2016-10-05 DIAGNOSIS — I10 Essential (primary) hypertension: Secondary | ICD-10-CM | POA: Diagnosis not present

## 2016-10-06 DIAGNOSIS — Z85828 Personal history of other malignant neoplasm of skin: Secondary | ICD-10-CM | POA: Diagnosis not present

## 2016-10-06 DIAGNOSIS — C44321 Squamous cell carcinoma of skin of nose: Secondary | ICD-10-CM | POA: Diagnosis not present

## 2016-10-12 DIAGNOSIS — H60542 Acute eczematoid otitis externa, left ear: Secondary | ICD-10-CM | POA: Diagnosis not present

## 2016-10-13 DIAGNOSIS — Z4802 Encounter for removal of sutures: Secondary | ICD-10-CM | POA: Diagnosis not present

## 2016-10-13 DIAGNOSIS — L570 Actinic keratosis: Secondary | ICD-10-CM | POA: Diagnosis not present

## 2016-11-16 DIAGNOSIS — I10 Essential (primary) hypertension: Secondary | ICD-10-CM | POA: Diagnosis not present

## 2016-11-18 DIAGNOSIS — I10 Essential (primary) hypertension: Secondary | ICD-10-CM | POA: Diagnosis not present

## 2016-11-18 DIAGNOSIS — E876 Hypokalemia: Secondary | ICD-10-CM | POA: Diagnosis not present

## 2017-01-01 DIAGNOSIS — H401132 Primary open-angle glaucoma, bilateral, moderate stage: Secondary | ICD-10-CM | POA: Diagnosis not present

## 2017-01-01 DIAGNOSIS — H5203 Hypermetropia, bilateral: Secondary | ICD-10-CM | POA: Diagnosis not present

## 2017-01-01 DIAGNOSIS — H04123 Dry eye syndrome of bilateral lacrimal glands: Secondary | ICD-10-CM | POA: Diagnosis not present

## 2017-01-01 DIAGNOSIS — H33322 Round hole, left eye: Secondary | ICD-10-CM | POA: Diagnosis not present

## 2017-01-01 DIAGNOSIS — H4089 Other specified glaucoma: Secondary | ICD-10-CM | POA: Diagnosis not present

## 2017-01-01 DIAGNOSIS — H33312 Horseshoe tear of retina without detachment, left eye: Secondary | ICD-10-CM | POA: Diagnosis not present

## 2017-01-01 DIAGNOSIS — Z961 Presence of intraocular lens: Secondary | ICD-10-CM | POA: Diagnosis not present

## 2017-01-01 DIAGNOSIS — H26492 Other secondary cataract, left eye: Secondary | ICD-10-CM | POA: Diagnosis not present

## 2017-01-01 DIAGNOSIS — H2511 Age-related nuclear cataract, right eye: Secondary | ICD-10-CM | POA: Diagnosis not present

## 2017-02-04 ENCOUNTER — Ambulatory Visit: Payer: Self-pay | Admitting: Family Medicine

## 2017-02-17 DIAGNOSIS — H60542 Acute eczematoid otitis externa, left ear: Secondary | ICD-10-CM | POA: Diagnosis not present

## 2017-02-17 DIAGNOSIS — H6122 Impacted cerumen, left ear: Secondary | ICD-10-CM | POA: Diagnosis not present

## 2017-03-16 ENCOUNTER — Encounter: Payer: Self-pay | Admitting: Family Medicine

## 2017-03-16 ENCOUNTER — Ambulatory Visit (INDEPENDENT_AMBULATORY_CARE_PROVIDER_SITE_OTHER): Payer: Medicare HMO | Admitting: Family Medicine

## 2017-03-16 VITALS — BP 142/86 | HR 73 | Temp 98.6°F | Resp 17 | Ht 69.0 in | Wt 176.0 lb

## 2017-03-16 DIAGNOSIS — I10 Essential (primary) hypertension: Secondary | ICD-10-CM | POA: Diagnosis not present

## 2017-03-16 DIAGNOSIS — E039 Hypothyroidism, unspecified: Secondary | ICD-10-CM

## 2017-03-16 DIAGNOSIS — E785 Hyperlipidemia, unspecified: Secondary | ICD-10-CM

## 2017-03-16 DIAGNOSIS — Z23 Encounter for immunization: Secondary | ICD-10-CM

## 2017-03-16 DIAGNOSIS — Z0000 Encounter for general adult medical examination without abnormal findings: Secondary | ICD-10-CM | POA: Diagnosis not present

## 2017-03-16 NOTE — Patient Instructions (Addendum)
Thanks for coming in today. Make sure you follow up within next month to discuss medications and medical problems (sooner if you are running out of medicines).  Please bring meds to that visit to determine refills.   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   I checked some bloodwork today - can review further next visit if needed.   Keeping you healthy  Get these tests  Blood pressure- Have your blood pressure checked once a year by your healthcare provider.  Normal blood pressure is 120/80  Weight- Have your body mass index (BMI) calculated to screen for obesity.  BMI is a measure of body fat based on height and weight. You can also calculate your own BMI at ViewBanking.[email or web address].  Cholesterol- Have your cholesterol checked every year.  Diabetes- Have your blood sugar checked regularly if you have high blood pressure, high cholesterol, have a family history of diabetes or if you are overweight.  Screening for Colon Cancer- Colonoscopy starting at age 74.  Screening may begin sooner depending on your family history and other health conditions. Follow up colonoscopy as directed by your Gastroenterologist.  Screening for Prostate Cancer- Both blood work (PSA) and a rectal exam help screen for Prostate Cancer.  Screening begins at age 83 with African-American men and at age 52 with Caucasian men.  Screening may begin sooner depending on your family history.  Take these medicines  Aspirin- One aspirin daily can help prevent Heart disease and Stroke.  Flu shot- Every fall.  Tetanus- Every 10 years.  Zostavax- Once after the age of 25 to prevent Shingles.  Pneumonia shot- Once after the age of 48; if you are younger than 76, ask your healthcare provider if you need a Pneumonia shot.  Take these steps  Don't smoke- If you do smoke, talk to your doctor about quitting.  For tips on how to quit, go to www.smokefree.gov or call 1-800-QUIT-NOW.  Be physically active- Exercise 5  days a week for at least 30 minutes.  If you are not already physically active start slow and gradually work up to 30 minutes of moderate physical activity.  Examples of moderate activity include walking briskly, mowing the yard, dancing, swimming, bicycling, etc.  Eat a healthy diet- Eat a variety of healthy food such as fruits, vegetables, low fat milk, low fat cheese, yogurt, lean meant, poultry, fish, beans, tofu, etc. For more information go to www.thenutritionsource.org  Drink alcohol in moderation- Limit alcohol intake to less than two drinks a day. Never drink and drive.  Dentist- Brush and floss twice daily; visit your dentist twice a year.  Depression- Your emotional health is as important as your physical health. If you're feeling down, or losing interest in things you would normally enjoy please talk to your healthcare provider.  Eye exam- Visit your eye doctor every year.  Safe sex- If you may be exposed to a sexually transmitted infection, use a condom.  Seat belts- Seat belts can save your life; always wear one.  Smoke/Carbon Monoxide detectors- These detectors need to be installed on the appropriate level of your home.  Replace batteries at least once a year.  Skin cancer- When out in the sun, cover up and use sunscreen 15 SPF or higher.  Violence- If anyone is threatening you, please tell your healthcare provider.  Living Will/ Health care power of attorney- Speak with your healthcare provider and family.     IF you received an x-ray today, you will receive an invoice  from [facility]. Please contact [facility] at [phone] with questions or concerns regarding your invoice.   IF you received labwork today, you will receive an invoice from [place]. Please contact [facility] at [phone] with questions or concerns regarding your invoice.   Our billing staff will not be able to assist you with questions regarding bills from these companies.  You  will be contacted with the lab results as soon as they are available. The fastest way to get your results is to activate your My Chart account. Instructions are located on the last page of this paperwork. If you have not heard from [place] regarding the results in 2 weeks, please contact this office.

## 2017-03-16 NOTE — Progress Notes (Addendum)
Subjective:  By signing my name below, I, [name], attest that this documentation has been prepared under the direction and in the presence of [name], MD Electronically Signed: Ladene Artist, ED Scribe 03/16/2017 at 8:38 AM.   Patient ID: [name], male    DOB: 12-02-1940, 76 y.o.   MRN: [id number]  Chief Complaint  Patient presents with  . Annual Exam   HPI [name] is a 76 y.o. male who presents to Primary Care at [facility] for a medicare annual wellness exam and to establish care. H/o lumbar stenosis, HTN, hypothyroidism, hyperlipidemia, allergic rhinitis, GERD, glaucoma, BPH, throat CA, anxiety treated with Lexapro. No h/o CHF. New pt to me. Seen in July by [facility] nephrology. Planned for recheck in January. Creatinine in July was 1.13. Previously followed by [facility], Dr. Suzy Ballard. Pt is fasting at this visit.  CA Screening Colonoscopy: 3-4 years ago, with a few polyps removed  Prostate CA Screening: followed by urologist Dr. Jeffie Pollock, last seen in 2017. Plan to follow-up as needed. H/o enlarged prostate but no h/o prostate CA. Followed by nephrologist as well. No results found for: PSA  Immunizations Immunization History  Administered Date(s) Administered  . Influenza, High Dose Seasonal PF 03/03/2016  . Influenza-Unspecified 02/05/2006, 01/17/2009, 01/23/2010, 02/11/2011, 02/19/2012, 01/13/2013, 01/17/2014, 02/28/2015, 01/02/2017  . Pneumococcal Conjugate-13 11/01/2005, 08/01/2013  . Pneumococcal Polysaccharide-23 10/22/2005  . Tdap 12/12/2008  . Zoster 11/13/2005  . Zoster Recombinat (Shingrix) 09/03/2016, 01/16/2017   Fall Screening Denies falls within the past year.   Depression Screening Depression screen [facility] 2/9 03/16/2017  Decreased Interest 0  Down, Depressed, Hopeless 0  PHQ - 2 Score 0   Functional Status Survey: Is the patient deaf or have difficulty hearing?: No Does the patient have difficulty seeing,  even when wearing glasses/contacts?: No Does the patient have difficulty concentrating, remembering, or making decisions?: No Does the patient have difficulty walking or climbing stairs?: No Does the patient have difficulty dressing or bathing?: No Does the patient have difficulty doing errands alone such as visiting a doctor's office or shopping?: No    Visual Acuity Screening   Right eye Left eye Both eyes  Without correction:     With correction: 20/25 20/25 20/25    Vision: followed by eye doctor for glaucoma  Dentist: seen regularly, appointment next week Exercise: minimal   Advanced Directives Pt does have an advanced directive.   Patient Active Problem List   Diagnosis Date Noted  . 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 Anxiety   . BPH (benign prostatic hyperplasia)   . Bruises easily   . Cancer (HCC)    throat  . Complication of anesthesia    difficult urination after anesthesia  . Dizziness   . GERD (gastroesophageal reflux disease)   . Glaucoma    left worse than right  . H/O hiatal hernia   . Heart murmur   . Hyperlipemia   . Hypertension    takes meds daily  . Hypothyroidism   . Urinary retention    Past Surgical History:  Procedure Laterality Date  . BACK SURGERY     ruptured disc repair  . COLONOSCOPY W/ POLYPECTOMY    . EYE SURGERY     left cataract  . HERNIA REPAIR    . RADICAL NECK DISSECTION  1989  . ROTATOR CUFF REPAIR     right  . TONSILLECTOMY     Allergies  Allergen Reactions  . Codeine Nausea Only   Prior to Admission medications   Medication Sig Start Date End Date Taking? Authorizing Provider  aspirin 81 MG tablet Take 81 mg by mouth at bedtime.    [provider]  Calcium-Vitamin D-Vitamin K (VIACTIV PO) Take 1 capsule by mouth daily.    [provider]  cloNIDine (CATAPRES - DOSED IN MG/24 HR) 0.2 mg/24hr patch Place 0.2 mg onto the skin once a  week.    [provider]  dorzolamide-timolol (COSOPT) 22.3-6.8 MG/ML ophthalmic solution Place 1 drop into both eyes 2 (two) times daily.    [provider]  escitalopram (LEXAPRO) 20 MG tablet Take 20 mg by mouth daily.    [provider]  HYDROcodone-acetaminophen (NORCO/VICODIN) 5-325 MG per tablet Take 1-2 tablets by mouth every 4 (four) hours as needed. Patient not taking: Reported on 05/16/2014 03/15/12   Earnie Larsson, MD  HYDROcodone-acetaminophen (NORCO/VICODIN) 5-325 MG per tablet Take 1-2 tablets by mouth every 4 (four) hours as needed for moderate pain. 05/22/14   Judeth Horn, MD  latanoprost (XALATAN) 0.005 % ophthalmic solution Place 1 drop into both eyes at bedtime.    [provider]  levothyroxine (SYNTHROID, LEVOTHROID) 50 MCG tablet Take 50 mcg by mouth daily.    [provider]  metaxalone (SKELAXIN) 800 MG tablet Take 1 tablet (800 mg total) by mouth 3 (three) times daily. Patient not taking: Reported on 05/16/2014 03/15/12   Earnie Larsson, MD  omeprazole (PRILOSEC) 20 MG capsule Take 20 mg by mouth daily.    [provider]  simvastatin (ZOCOR) 20 MG tablet Take 20 mg by mouth every evening.    [provider]  valsartan (DIOVAN) 320 MG tablet Take 320 mg by mouth daily.    [provider]   Social History   Socioeconomic History  . Marital status: Married    Spouse name: Not on file  . Number of children: Not on file  . Years of education: Not on file  . Highest education level: Not on file  Social Needs  . Financial resource strain: Not on file  . Food insecurity - worry: Not on file  . Food insecurity - inability: Not on file  . Transportation needs - medical: Not on file  . Transportation needs - non-medical: Not on file  Occupational History  . Not on file  Tobacco Use  . Smoking status: Former Research scientist (life sciences)  . Smokeless tobacco: Never Used  Substance and Sexual Activity  . Alcohol use: Yes     Comment: 1 or 2 beers a week  . Drug use: No  . Sexual activity: No  Other Topics Concern  . Not on file  Social History Narrative  . Not on file   Review of Systems    Objective:   Physical Exam  Constitutional: He is oriented to person, place, and time. He appears well-developed and well-nourished.  HENT:  Head: Normocephalic and atraumatic.  Right Ear: External ear normal.  Left Ear: External ear normal.  Mouth/Throat: Oropharynx is clear and moist.  Eyes: Conjunctivae and EOM are normal. Pupils are equal, round, and reactive to light.  Neck: Normal range of motion. Neck supple. No thyromegaly present.  L neck there is some recess/soft tissue deficit compared to R  Cardiovascular: Normal rate, regular rhythm, normal heart sounds and intact distal pulses.  Pulmonary/Chest: Effort normal and breath sounds normal. No respiratory distress. He has no wheezes.  Abdominal: Soft. He exhibits no  distension. There is no tenderness.  Musculoskeletal: Normal range of motion. He exhibits no edema or tenderness.  Lymphadenopathy:    He has no cervical adenopathy.  Neurological: He is alert and oriented to person, place, and time. He has normal reflexes.  Skin: Skin is warm and dry.  Psychiatric: He has a normal mood and affect. His behavior is normal.  Vitals reviewed.  Vitals:   03/16/17 0824  BP: (!) 142/86  Pulse: 73  Resp: 17  Temp: 98.6 F (37 C)  TempSrc: Oral  SpO2: 98%  Weight: 176 lb (79.8 kg)  Height: 5\' 9"  (1.753 m)      Assessment & Plan:   ZAVIER CANELA is a 76 y.o. male Medicare annual wellness visit, subsequent  -  - anticipatory guidance as below in AVS, screening labs if needed. Health maintenance items as above in HPI discussed/recommended as applicable.   - no concerning responses on depression, fall, or functional status screening. Any positive responses noted as above. Advanced directives discussed as in CHL.   -Continue follow-up with ophthalmology  for history of glaucoma  Essential hypertension  - Borderline elevated, plan for return visit within the next month with her medications to determine refills and repeat BP at that time. Has also followed up with nephrology.  Hyperlipidemia, unspecified hyperlipidemia type - Plan: Lipid panel, Comprehensive metabolic panel  - Check labs, tolerating current regimen, bring meds to next visit. No changes for now  Hypothyroidism, unspecified type - Plan: TSH  - Check TSH, continue same dose Synthroid for now.   Recommended follow up in next month with home meds to discuss individual medications and medical problems further. Advised to let me know if he is running out of medicines prior to that time.  Meds ordered this encounter  Medications  . doxazosin (CARDURA) 8 MG tablet  . fluticasone (FLONASE) 50 MCG/ACT nasal spray  . furosemide (LASIX) 20 MG tablet  . ipratropium (ATROVENT) 0.03 % nasal spray  . losartan (COZAAR) 50 MG tablet  . mupirocin ointment (BACTROBAN) 2 %    Sig: Apply topically.  . Tafluprost 0.0015 % SOLN    Sig: Apply to eye.   Patient Instructions   Thanks for coming in today. Make sure you follow up within next month to discuss medications and medical problems (sooner if you are running out of medicines).  Please bring meds to that visit to determine refills.   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   I checked some bloodwork today - can review further next visit if needed.   Keeping you healthy  Get these tests  Blood pressure- Have your blood pressure checked once a year by your healthcare provider.  Normal blood pressure is 120/80  Weight- Have your body mass index (BMI) calculated to screen for obesity.  BMI is a measure of body fat based on height and weight. You can also calculate your own BMI at ViewBanking.si.  Cholesterol- Have your cholesterol checked every year.  Diabetes- Have your blood sugar checked regularly if you have  high blood pressure, high cholesterol, have a family history of diabetes or if you are overweight.  Screening for Colon Cancer- Colonoscopy starting at age 20.  Screening may begin sooner depending on your family history and other health conditions. Follow up colonoscopy as directed by your Gastroenterologist.  Screening for Prostate Cancer- Both blood work (PSA) and a rectal exam help screen for Prostate Cancer.  Screening begins at age 70 with African-American men  and at age 66 with Caucasian men.  Screening may begin sooner depending on your family history.  Take these medicines  Aspirin- One aspirin daily can help prevent Heart disease and Stroke.  Flu shot- Every fall.  Tetanus- Every 10 years.  Zostavax- Once after the age of 1 to prevent Shingles.  Pneumonia shot- Once after the age of 21; if you are younger than 71, ask your healthcare provider if you need a Pneumonia shot.  Take these steps  Don't smoke- If you do smoke, talk to your doctor about quitting.  For tips on how to quit, go to www.smokefree.gov or call 1-800-QUIT-NOW.  Be physically active- Exercise 5 days a week for at least 30 minutes.  If you are not already physically active start slow and gradually work up to 30 minutes of moderate physical activity.  Examples of moderate activity include walking briskly, mowing the yard, dancing, swimming, bicycling, etc.  Eat a healthy diet- Eat a variety of healthy food such as fruits, vegetables, low fat milk, low fat cheese, yogurt, lean meant, poultry, fish, beans, tofu, etc. For more information go to www.thenutritionsource.org  Drink alcohol in moderation- Limit alcohol intake to less than two drinks a day. Never drink and drive.  Dentist- Brush and floss twice daily; visit your dentist twice a year.  Depression- Your emotional health is as important as your physical health. If you're feeling down, or losing interest in things you would normally enjoy please talk to  your healthcare provider.  Eye exam- Visit your eye doctor every year.  Safe sex- If you may be exposed to a sexually transmitted infection, use a condom.  Seat belts- Seat belts can save your life; always wear one.  Smoke/Carbon Monoxide detectors- These detectors need to be installed on the appropriate level of your home.  Replace batteries at least once a year.  Skin cancer- When out in the sun, cover up and use sunscreen 15 SPF or higher.  Violence- If anyone is threatening you, please tell your healthcare provider.  Living Will/ Health care power of attorney- Speak with your healthcare provider and family.     IF you received an x-ray today, you will receive an invoice from [facility]. Please contact [facility] at [phone] with questions or concerns regarding your invoice.   IF you received labwork today, you will receive an invoice from [place]. Please contact [facility] at [phone] with questions or concerns regarding your invoice.   Our billing staff will not be able to assist you with questions regarding bills from these companies.  You will be contacted with the lab results as soon as they are available. The fastest way to get your results is to activate your My Chart account. Instructions are located on the last page of this paperwork. If you have not heard from [place] regarding the results in 2 weeks, please contact this office.

## 2017-03-17 LAB — COMPREHENSIVE METABOLIC PANEL
ALK PHOS: 80 IU/L (ref 39–117)
ALT: 16 IU/L (ref 0–44)
AST: 18 IU/L (ref 0–40)
Albumin/Globulin Ratio: 2.2 (ref 1.2–2.2)
Albumin: 4.7 g/dL (ref 3.5–4.8)
BUN/Creatinine Ratio: 16 (ref 10–24)
BUN: 15 mg/dL (ref 8–27)
Bilirubin Total: 0.5 mg/dL (ref 0.0–1.2)
CO2: 27 mmol/L (ref 20–29)
CREATININE: 0.95 mg/dL (ref 0.76–1.27)
Calcium: 9.1 mg/dL (ref 8.6–10.2)
Chloride: 100 mmol/L (ref 96–106)
GFR calc Af Amer: 90 mL/min/{1.73_m2} (ref >59)
GFR, EST NON AFRICAN AMERICAN: 77 mL/min/{1.73_m2} (ref >59)
Globulin, Total: 2.1 g/dL (ref 1.5–4.5)
Glucose: 95 mg/dL (ref 65–99)
POTASSIUM: 4 mmol/L (ref 3.5–5.2)
Sodium: 142 mmol/L (ref 134–144)
Total Protein: 6.8 g/dL (ref 6.0–8.5)

## 2017-03-17 LAB — LIPID PANEL
Chol/HDL Ratio: 1.9 ratio (ref 0.0–5.0)
Cholesterol, Total: 141 mg/dL (ref 100–199)
HDL: 73 mg/dL (ref >39)
LDL Calculated: 55 mg/dL (ref 0–99)
TRIGLYCERIDES: 63 mg/dL (ref 0–149)
VLDL CHOLESTEROL CAL: 13 mg/dL (ref 5–40)

## 2017-03-17 LAB — TSH: TSH: 6.23 u[IU]/mL — AB (ref 0.450–4.500)

## 2017-03-24 ENCOUNTER — Telehealth: Payer: Self-pay | Admitting: Family Medicine

## 2017-03-24 NOTE — Telephone Encounter (Signed)
I received a paper request for losartan 50 mg to be changed to 90 day supply as previously written for 30 days. I do not see where we wrote that medicine recently. Based on his last office visit, wanted to make sure we know what medications he was taking.   I do not mind refilling the losartan at a 90 day supply if that is definitely the dose he is taking.  Would still recommend follow-up in one month. Please clarify home medication regimen. Thanks. -JG

## 2017-03-29 ENCOUNTER — Encounter: Payer: Self-pay | Admitting: Family Medicine

## 2017-03-29 ENCOUNTER — Other Ambulatory Visit: Payer: Self-pay

## 2017-03-29 ENCOUNTER — Ambulatory Visit (INDEPENDENT_AMBULATORY_CARE_PROVIDER_SITE_OTHER): Payer: Medicare HMO | Admitting: Family Medicine

## 2017-03-29 VITALS — BP 160/102 | HR 69 | Temp 98.2°F | Resp 18 | Ht 69.0 in | Wt 178.8 lb

## 2017-03-29 DIAGNOSIS — I10 Essential (primary) hypertension: Secondary | ICD-10-CM | POA: Diagnosis not present

## 2017-03-29 MED ORDER — LOSARTAN POTASSIUM 100 MG PO TABS: 100.0000 mg | ORAL_TABLET | Freq: Every day | ORAL | 2 refills | Status: DC

## 2017-03-29 NOTE — Patient Instructions (Addendum)
  I would clarify with Dr. Jeffie Pollock if you need repeat PSA testing. If so, I do not mind ordering that test without an office visit if needed - just let me know.   For blood pressure, increase losartan to 100mg  each day, monitor home readings and follow with me or nephrologist within 1 month. If any lightheadedness or low BP readings - return sooner. Return to the clinic or go to the nearest emergency room if any of your symptoms worsen or new symptoms occur.   IF you received an x-ray today, you will receive an invoice from [facility]. Please contact [facility] at [phone] with questions or concerns regarding your invoice.   IF you received labwork today, you will receive an invoice from [place]. Please contact [facility] at [phone] with questions or concerns regarding your invoice.   Our billing staff will not be able to assist you with questions regarding bills from these companies.  You will be contacted with the lab results as soon as they are available. The fastest way to get your results is to activate your My Chart account. Instructions are located on the last page of this paperwork. If you have not heard from [place] regarding the results in 2 weeks, please contact this office.

## 2017-03-29 NOTE — Telephone Encounter (Signed)
Pt has Appt today

## 2017-03-29 NOTE — Progress Notes (Signed)
Subjective:  By signing my name below, I, [name], attest that this documentation has been prepared under the direction and in the presence of [name], MD Electronically Signed: Ladene Artist, ED Scribe 03/29/2017 at 3:36 PM.   Patient ID: [name], male    DOB: 1940/07/25, 76 y.o.   MRN: [id number]  Chief Complaint  Patient presents with  . Medication Refill    losartan   HPI [name] is a 76 y.o. male who presents to Primary Care at [facility] for f/u. Seen 11/13. New pt to me.   HTN Borderline elevated at 142/86. Asked that he return with meds to determine changes and ongoing f/u with nephrology. BP was 127/74 at July 18 visit with Dr. Neta Ehlers. Pt was on 50 mg Losartan at that time. He does have Losartan 50 mg prescription. He is also on Doxazosin 8 mg qd and Lasix 20 mg qd. Pt checks his BP outside of the office with average systolic readings of 517-001 but has noticed that it seems to fluctuate to 150s over the last 1-2 weeks. Pt does report eating more salt lately and seafood last night. Denies cp, sob, light-headedness, dizziness, HA, slurred speech, weakness in extremities.   Patient Active Problem List   Diagnosis Date Noted  . 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 Anxiety   . BPH (benign prostatic hyperplasia)   . Bruises easily   . Cancer (HCC)    throat  . Complication of anesthesia    difficult urination after anesthesia  . Dizziness   . GERD (gastroesophageal reflux disease)   . Glaucoma    left worse than right  . H/O hiatal hernia   . Heart murmur   . Hyperlipemia   . Hypertension    takes meds daily  . Hypothyroidism   . Urinary retention    Past Surgical History:  Procedure Laterality Date  . ANTERIOR LAT LUMBAR FUSION  03/11/2012   Procedure: ANTERIOR LATERAL LUMBAR FUSION 1 LEVEL;  Surgeon: Charlie Pitter, MD;  Location: [place] [facility];  Service: Neurosurgery;   Laterality: Left;  Left lumbar four-five extreme lumbar interbody fusion with percutaneous pedicle screws   . BACK SURGERY     ruptured disc repair  . COLONOSCOPY W/ POLYPECTOMY    . EYE SURGERY     left cataract  . HERNIA REPAIR    . INGUINAL HERNIA REPAIR Right 05/22/2014   Procedure: REPAIR RECURRENT RIGHT INGUINAL HERNIA;  Surgeon: Doreen Salvage, MD;  Location: [place];  Service: General;  Laterality: Right;  . INSERTION OF MESH Right 05/22/2014   Procedure: INSERTION OF MESH;  Surgeon: Doreen Salvage, MD;  Location: [place];  Service: General;  Laterality: Right;  . LUMBAR PERCUTANEOUS PEDICLE SCREW 1 LEVEL  03/11/2012   Procedure: LUMBAR PERCUTANEOUS PEDICLE SCREW 1 LEVEL;  Surgeon: Charlie Pitter, MD;  Location: [place] [facility];  Service: Neurosurgery;  Laterality: Left;  Left lumbar four-five extreme lumbar interbody fusion with percutaneous pedicle screws   . RADICAL NECK DISSECTION  1989  . ROTATOR CUFF REPAIR     right  . TONSILLECTOMY     Allergies  Allergen Reactions  . Codeine Nausea Only   Prior to Admission medications   Medication Sig Start Date End Date Taking? Authorizing Provider  aspirin 81 MG tablet Take 81 mg by mouth at bedtime.    [provider]  Calcium-Vitamin D-Vitamin K (VIACTIV PO)  Take 1 capsule by mouth daily.    [provider]  dorzolamide-timolol (COSOPT) 22.3-6.8 MG/ML ophthalmic solution Place 1 drop into both eyes 2 (two) times daily.    [provider]  doxazosin (CARDURA) 8 MG tablet  03/03/17   [provider]  escitalopram (LEXAPRO) 20 MG tablet Take 20 mg by mouth daily.    [provider]  fluticasone Asencion Islam) 50 MCG/ACT nasal spray  06/04/15   [provider]  furosemide (LASIX) 20 MG tablet  02/19/17   [provider]  ipratropium (ATROVENT) 0.03 % nasal spray  12/22/16   [provider]  latanoprost (XALATAN) 0.005 % ophthalmic solution Place 1 drop into both eyes at bedtime.     [provider]  levothyroxine (SYNTHROID, LEVOTHROID) 50 MCG tablet Take 50 mcg by mouth daily.    [provider]  losartan (COZAAR) 50 MG tablet  12/22/16   [provider]  mupirocin ointment (BACTROBAN) 2 % Apply topically. 07/31/15   [provider]  omeprazole (PRILOSEC) 20 MG capsule Take 20 mg by mouth daily.    [provider]  simvastatin (ZOCOR) 20 MG tablet Take 20 mg by mouth every evening.    [provider]  Tafluprost 0.0015 % SOLN Apply to eye. 09/15/16   [provider]   Social History   Socioeconomic History  . Marital status: Married    Spouse name: Not on file  . Number of children: Not on file  . Years of education: Not on file  . Highest education level: Not on file  Social Needs  . Financial resource strain: Not on file  . Food insecurity - worry: Not on file  . Food insecurity - inability: Not on file  . Transportation needs - medical: Not on file  . Transportation needs - non-medical: Not on file  Occupational History  . Not on file  Tobacco Use  . Smoking status: Former Research scientist (life sciences)  . Smokeless tobacco: Never Used  Substance and Sexual Activity  . Alcohol use: Yes    Comment: 1 or 2 beers a week  . Drug use: No  . Sexual activity: No  Other Topics Concern  . Not on file  Social History Narrative  . Not on file   Review of Systems  Constitutional: Negative for fatigue and unexpected weight change.  Eyes: Negative for visual disturbance.  Respiratory: Negative for cough, chest tightness and shortness of breath.   Cardiovascular: Negative for chest pain, palpitations and leg swelling.  Gastrointestinal: Negative for abdominal pain and blood in stool.  Neurological: Negative for dizziness, speech difficulty, weakness, light-headedness and headaches.      Objective:   Physical Exam  Constitutional: He is oriented to person, place, and time. He appears well-developed and well-nourished.    HENT:  Head: Normocephalic and atraumatic.  Eyes: EOM are normal. Pupils are equal, round, and reactive to light.  Neck: No JVD present. Carotid bruit is not present.  Cardiovascular: Normal rate, regular rhythm and normal heart sounds.  No murmur heard. Pulmonary/Chest: Effort normal and breath sounds normal. He has no rales.  Musculoskeletal: He exhibits no edema.  Neurological: He is alert and oriented to person, place, and time.  Skin: Skin is warm and dry.  Psychiatric: He has a normal mood and affect.  Vitals reviewed.  Vitals:   03/29/17 1513 03/29/17 1528 03/29/17 1546  BP: (!) 178/90 (!) 174/88 (!) 180/106  Pulse: 69    Resp: 18  Temp: 98.2 F (36.8 C)    TempSrc: Oral    SpO2: 96%    Weight: 178 lb 12.8 oz (81.1 kg)    Height: 5\' 9"  (1.753 m)        Assessment & Plan:   [name] is a 75 y.o. male Essential hypertension - Plan: losartan (COZAAR) 100 MG tablet Uncontrolled, increase losartan to 100 mg daily, orthostatic precautions discussed, follow-up with myself or nephrology within 1 month.  Also asked that he clarify PSA testing with his urologist, to determine if I need to check that here or not. Lab visit only if needed since we have discussed that today.  Meds ordered this encounter  Medications  . losartan (COZAAR) 100 MG tablet    Sig: Take 1 tablet (100 mg total) by mouth daily.    Dispense:  30 tablet    Refill:  2   Patient Instructions    I would clarify with Dr. Jeffie Pollock if you need repeat PSA testing. If so, I do not mind ordering that test without an office visit if needed - just let me know.   For blood pressure, increase losartan to 100mg  each day, monitor home readings and follow with me or nephrologist within 1 month. If any lightheadedness or low BP readings - return sooner. Return to the clinic or go to the nearest emergency room if any of your symptoms worsen or new symptoms occur.   IF you received an x-ray today, you will  receive an invoice from [facility]. Please contact [facility] at [phone] with questions or concerns regarding your invoice.   IF you received labwork today, you will receive an invoice from [place]. Please contact [facility] at [phone] with questions or concerns regarding your invoice.   Our billing staff will not be able to assist you with questions regarding bills from these companies.  You will be contacted with the lab results as soon as they are available. The fastest way to get your results is to activate your My Chart account. Instructions are located on the last page of this paperwork. If you have not heard from [place] regarding the results in 2 weeks, please contact this office.      I personally performed the services described in this documentation, which was scribed in my presence. The recorded information has been reviewed and considered for accuracy and completeness, addended by me as needed, and agree with information above.  Signed,   Merri Ray, MD Primary Care at [place].  03/31/17 12:51 PM

## 2017-03-31 DIAGNOSIS — Z85828 Personal history of other malignant neoplasm of skin: Secondary | ICD-10-CM | POA: Diagnosis not present

## 2017-03-31 DIAGNOSIS — D2361 Other benign neoplasm of skin of right upper limb, including shoulder: Secondary | ICD-10-CM | POA: Diagnosis not present

## 2017-03-31 DIAGNOSIS — L821 Other seborrheic keratosis: Secondary | ICD-10-CM | POA: Diagnosis not present

## 2017-03-31 DIAGNOSIS — D0439 Carcinoma in situ of skin of other parts of face: Secondary | ICD-10-CM | POA: Diagnosis not present

## 2017-03-31 DIAGNOSIS — C44612 Basal cell carcinoma of skin of right upper limb, including shoulder: Secondary | ICD-10-CM | POA: Diagnosis not present

## 2017-03-31 DIAGNOSIS — D485 Neoplasm of uncertain behavior of skin: Secondary | ICD-10-CM | POA: Diagnosis not present

## 2017-03-31 DIAGNOSIS — C44519 Basal cell carcinoma of skin of other part of trunk: Secondary | ICD-10-CM | POA: Diagnosis not present

## 2017-03-31 DIAGNOSIS — L570 Actinic keratosis: Secondary | ICD-10-CM | POA: Diagnosis not present

## 2017-04-20 ENCOUNTER — Ambulatory Visit: Payer: Medicare HMO | Admitting: Family Medicine

## 2017-04-20 ENCOUNTER — Telehealth: Payer: Self-pay | Admitting: Family Medicine

## 2017-04-20 MED ORDER — LEVOTHYROXINE SODIUM 50 MCG PO TABS: 50.0000 ug | ORAL_TABLET | Freq: Every day | ORAL | 0 refills | Status: DC

## 2017-04-20 NOTE — Telephone Encounter (Signed)
Copied from [place] [id number]. Topic: Quick Communication - Rx Refill/Question >> Apr 20, 2017 12:59 PM Synthia Innocent wrote: Has the patient contacted their pharmacy? Yes.     (Agent: If no, request that the patient contact the pharmacy for the refill.)   Preferred Pharmacy (with phone number or street name): [facility]   Agent: Please be advised that RX refills may take up to 3 business days. We ask that you follow-up with your pharmacy. Requesting refill on levothyroxine (SYNTHROID, LEVOTHROID) 50 MCG tablet

## 2017-04-20 NOTE — Telephone Encounter (Signed)
Office visit: 03/16/17   Next OV:05/11/17  TSH 03/16/17 Refilled until next appt.

## 2017-05-11 ENCOUNTER — Other Ambulatory Visit: Payer: Self-pay

## 2017-05-11 ENCOUNTER — Encounter: Payer: Self-pay | Admitting: Family Medicine

## 2017-05-11 ENCOUNTER — Ambulatory Visit (INDEPENDENT_AMBULATORY_CARE_PROVIDER_SITE_OTHER): Payer: Medicare HMO | Admitting: Family Medicine

## 2017-05-11 VITALS — BP 130/74 | HR 76 | Temp 97.6°F | Resp 17 | Ht 69.0 in | Wt 176.4 lb

## 2017-05-11 DIAGNOSIS — I10 Essential (primary) hypertension: Secondary | ICD-10-CM | POA: Diagnosis not present

## 2017-05-11 DIAGNOSIS — E039 Hypothyroidism, unspecified: Secondary | ICD-10-CM

## 2017-05-11 DIAGNOSIS — Z23 Encounter for immunization: Secondary | ICD-10-CM | POA: Diagnosis not present

## 2017-05-11 MED ORDER — LEVOTHYROXINE SODIUM 50 MCG PO TABS: 50.0000 ug | ORAL_TABLET | Freq: Every day | ORAL | 1 refills | Status: DC

## 2017-05-11 MED ORDER — LOSARTAN POTASSIUM 100 MG PO TABS: 100.0000 mg | ORAL_TABLET | Freq: Every day | ORAL | 1 refills | Status: DC

## 2017-05-11 NOTE — Progress Notes (Signed)
Subjective:  By signing my name below, I, [name], attest that this documentation has been prepared under the direction and in the presence of Derek Agreste, MD Electronically Signed: Ladene Artist, ED Scribe 05/11/2017 at 10:18 AM.   Patient ID: [name], male    DOB: 1940-11-21, 77 y.o.   MRN: [id number]  Chief Complaint  Patient presents with  . Follow-up    blood pressure   HPI [name] is a 76 y.o. male who presents to Primary Care at [facility] for f/u.  HTN Had borderline readings previously. Further elevations last visit with 160/102 reading. At that time he was taking lasix 20 mg qd, doxazosin 8 mg qd, losartan 60 mg qd. He did admit to increased sodium in diet prior to that visit. Losartan was increased to 100 mg qd. He is also followed by nephrology. Lab Results  Component Value Date   CREATININE 0.95 03/16/2017   Presents with home readings ranging from 128-160/69-85 with HR 59-80. He has been compliant with medications. Denies light-headedness, dizziness, HA, cp, sob, any new side-effects. Pt has an upcoming appointment with his nephrologist, Dr. Neta Ballard, on 1/18.  Hypothyroidism  Lab Results  Component Value Date   TSH 6.230 (H) 03/16/2017  He was continued on same dose of synthroid but reccommended closer f/u with borderline level as above.  Health Maintenance  Immunization History  Administered Date(s) Administered  . Influenza, High Dose Seasonal PF 03/03/2016  . Influenza,inj,Quad PF,6+ Mos 03/16/2017  . Influenza-Unspecified 02/05/2006, 01/17/2009, 01/23/2010, 02/11/2011, 02/19/2012, 01/13/2013, 01/17/2014, 02/28/2015  . Pneumococcal Conjugate-13 11/01/2005, 08/01/2013  . Pneumococcal Polysaccharide-23 10/22/2005  . Tdap 12/12/2008  . Zoster 11/13/2005  . Zoster Recombinat (Shingrix) 09/03/2016, 01/16/2017  Pneumovax: given today  Patient Active Problem List   Diagnosis Date Noted  . Lumbar stenosis with neurogenic claudication  03/11/2012   Past Medical History:  Diagnosis Date  . Anxiety   . BPH (benign prostatic hyperplasia)   . Bruises easily   . Cancer (HCC)    throat  . Complication of anesthesia    difficult urination after anesthesia  . Dizziness   . GERD (gastroesophageal reflux disease)   . Glaucoma    left worse than right  . H/O hiatal hernia   . Heart murmur   . Hyperlipemia   . Hypertension    takes meds daily  . Hypothyroidism   . Urinary retention    Past Surgical History:  Procedure Laterality Date  . ANTERIOR LAT LUMBAR FUSION  03/11/2012   Procedure: ANTERIOR LATERAL LUMBAR FUSION 1 LEVEL;  Surgeon: Derek Pitter, MD;  Location: [place] [facility];  Service: Neurosurgery;  Laterality: Left;  Left lumbar four-five extreme lumbar interbody fusion with percutaneous pedicle screws   . BACK SURGERY     ruptured disc repair  . COLONOSCOPY W/ POLYPECTOMY    . EYE SURGERY     left cataract  . HERNIA REPAIR    . INGUINAL HERNIA REPAIR Right 05/22/2014   Procedure: REPAIR RECURRENT RIGHT INGUINAL HERNIA;  Surgeon: Derek Salvage, MD;  Location: [place];  Service: General;  Laterality: Right;  . INSERTION OF MESH Right 05/22/2014   Procedure: INSERTION OF MESH;  Surgeon: Derek Salvage, MD;  Location: [place];  Service: General;  Laterality: Right;  . LUMBAR PERCUTANEOUS PEDICLE SCREW 1 LEVEL  03/11/2012   Procedure: LUMBAR PERCUTANEOUS PEDICLE SCREW 1 LEVEL;  Surgeon: Derek Pitter, MD;  Location: [place] [facility];  Service: Neurosurgery;  Laterality: Left;  Left lumbar four-five extreme lumbar interbody fusion with percutaneous pedicle screws   . RADICAL NECK DISSECTION  1989  . ROTATOR CUFF REPAIR     right  . TONSILLECTOMY     Allergies  Allergen Reactions  . Codeine Nausea Only   Prior to Admission medications   Medication Sig Start Date End Date Taking? Authorizing Provider  aspirin 81 MG tablet Take 81 mg by mouth at bedtime.    [provider]  Calcium-Vitamin D-Vitamin K (VIACTIV PO) Take 1  capsule by mouth daily.    [provider]  dorzolamide-timolol (COSOPT) 22.3-6.8 MG/ML ophthalmic solution Place 1 drop into both eyes 2 (two) times daily.    [provider]  doxazosin (CARDURA) 8 MG tablet  03/03/17   [provider]  escitalopram (LEXAPRO) 20 MG tablet Take 20 mg by mouth daily.    [provider]  fluticasone Asencion Islam) 50 MCG/ACT nasal spray  06/04/15   [provider]  furosemide (LASIX) 20 MG tablet  02/19/17   [provider]  ipratropium (ATROVENT) 0.03 % nasal spray  12/22/16   [provider]  latanoprost (XALATAN) 0.005 % ophthalmic solution Place 1 drop into both eyes at bedtime.    [provider]  levothyroxine (SYNTHROID, LEVOTHROID) 50 MCG tablet Take 1 tablet (50 mcg total) by mouth daily. 04/20/17   Derek Agreste, MD  losartan (COZAAR) 100 MG tablet Take 1 tablet (100 mg total) by mouth daily. 03/29/17   Derek Agreste, MD  mupirocin ointment (BACTROBAN) 2 % Apply topically. 07/31/15   [provider]  omeprazole (PRILOSEC) 20 MG capsule Take 20 mg by mouth daily.    [provider]  simvastatin (ZOCOR) 20 MG tablet Take 20 mg by mouth every evening.    [provider]  Tafluprost 0.0015 % SOLN Apply to eye. 09/15/16   [provider]   Social History   Socioeconomic History  . Marital status: Married    Spouse name: Not on file  . Number of children: Not on file  . Years of education: Not on file  . Highest education level: Not on file  Social Needs  . Financial resource strain: Not on file  . Food insecurity - worry: Not on file  . Food insecurity - inability: Not on file  . Transportation needs - medical: Not on file  . Transportation needs - non-medical: Not on file  Occupational History  . Not on file  Tobacco Use  . Smoking status: Former Research scientist (life sciences)  . Smokeless tobacco: Never Used  Substance and Sexual Activity  . Alcohol use: Yes     Comment: 1 or 2 beers a week  . Drug use: No  . Sexual activity: No  Other Topics Concern  . Not on file  Social History Narrative  . Not on file   Review of Systems  Constitutional: Negative for fatigue and unexpected weight change.  Eyes: Negative for visual disturbance.  Respiratory: Negative for cough, chest tightness and shortness of breath.   Cardiovascular: Negative for chest pain, palpitations and leg swelling.  Gastrointestinal: Negative for abdominal pain and blood in stool.  Neurological: Negative for dizziness, light-headedness and headaches.      Objective:   Physical Exam  Constitutional: He is oriented to person, place, and time. He appears well-developed and well-nourished.  HENT:  Head: Normocephalic and atraumatic.  Eyes: EOM are normal. Pupils are equal, round, and reactive to light.  Neck: No JVD present. Carotid  bruit is not present. No thyroid mass and no thyromegaly present.  Cardiovascular: Normal rate, regular rhythm and normal heart sounds.  No murmur heard. Pulmonary/Chest: Effort normal and breath sounds normal. He has no rales.  Musculoskeletal: He exhibits no edema.  Neurological: He is alert and oriented to person, place, and time.  Skin: Skin is warm and dry.  Psychiatric: He has a normal mood and affect.  Vitals reviewed.  Vitals:   05/11/17 1005  BP: 130/74  Pulse: 76  Resp: 17  Temp: 97.6 F (36.4 C)  TempSrc: Oral  SpO2: 98%  Weight: 176 lb 6.4 oz (80 kg)  Height: 5\' 9"  (1.753 m)      Assessment & Plan:   CHRISTINO MCGLINCHEY is a 77 y.o. male Essential hypertension - Plan: losartan (COZAAR) 100 MG tablet  - Stable at increased dose of losartan, continue same regimen and follow up with nephrology as planned later this month  Hypothyroidism, unspecified type - Plan: TSH + free T4, levothyroxine (SYNTHROID, LEVOTHROID) 50 MCG tablet  - Borderline TSH, repeat TSH and free T4, continue same dose of Synthroid for now.  Need for  prophylactic vaccination against Streptococcus pneumoniae (pneumococcus) - Plan: Pneumococcal polysaccharide vaccine 23-valent greater than or equal to 2yo subcutaneous/IM  - Pneumovax given  Recheck in 6 months Meds ordered this encounter  Medications  . losartan (COZAAR) 100 MG tablet    Sig: Take 1 tablet (100 mg total) by mouth daily.    Dispense:  90 tablet    Refill:  1  . levothyroxine (SYNTHROID, LEVOTHROID) 50 MCG tablet    Sig: Take 1 tablet (50 mcg total) by mouth daily.    Dispense:  90 tablet    Refill:  1   Patient Instructions   New dose of medication appears to be ok. Continue same dose of meds for now and keep follow up with nephrologist as planned.   I will recheck your thyroid test today to see if any changes needed.  Pneumonia vaccine given today.    IF you received an x-ray today, you will receive an invoice from [facility]. Please contact [facility] at [phone] with questions or concerns regarding your invoice.   IF you received labwork today, you will receive an invoice from [facility]. Please contact [facility] at [phone] with questions or concerns regarding your invoice.   Our billing staff will not be able to assist you with questions regarding bills from these companies.  You will be contacted with the lab results as soon as they are available. The fastest way to get your results is to activate your My Chart account. Instructions are located on the last page of this paperwork. If you have not heard from [place] regarding the results in 2 weeks, please contact this office.      I personally performed the services described in this documentation, which was scribed in my presence. The recorded information has been reviewed and considered for accuracy and completeness, addended by me as needed, and agree with information above.  Signed,   Merri Ray, MD Primary Care at [place].  05/11/17 10:31 AM

## 2017-05-11 NOTE — Patient Instructions (Addendum)
New dose of medication appears to be ok. Continue same dose of meds for now and keep follow up with nephrologist as planned.   I will recheck your thyroid test today to see if any changes needed.  Pneumonia vaccine given today.    IF you received an x-ray today, you will receive an invoice from [facility]. Please contact [facility] at [phone] with questions or concerns regarding your invoice.   IF you received labwork today, you will receive an invoice from [facility]. Please contact [facility] at [phone] with questions or concerns regarding your invoice.   Our billing staff will not be able to assist you with questions regarding bills from these companies.  You will be contacted with the lab results as soon as they are available. The fastest way to get your results is to activate your My Chart account. Instructions are located on the last page of this paperwork. If you have not heard from [place] regarding the results in 2 weeks, please contact this office.

## 2017-05-12 LAB — TSH+FREE T4
FREE T4: 0.96 ng/dL (ref 0.82–1.77)
TSH: 4.52 u[IU]/mL — ABNORMAL HIGH (ref 0.450–4.500)

## 2017-05-17 DIAGNOSIS — I10 Essential (primary) hypertension: Secondary | ICD-10-CM | POA: Diagnosis not present

## 2017-05-21 DIAGNOSIS — I10 Essential (primary) hypertension: Secondary | ICD-10-CM | POA: Diagnosis not present

## 2017-05-24 ENCOUNTER — Telehealth: Payer: Self-pay | Admitting: Family Medicine

## 2017-05-24 NOTE — Telephone Encounter (Signed)
Patient requesting refill on furosemide, last OV 05/11/17, last filled in October, 2018, no provider listed on prescription.

## 2017-05-24 NOTE — Telephone Encounter (Signed)
Copied from [place] [id number]. Topic: Quick Communication - Rx Refill/Question >> May 24, 2017  9:46 AM Arletha Grippe wrote: Medication: furosemide (LASIX) 20 MG tablet 90 day supply and refills    Has the patient contacted their pharmacy? No. Can not get  through    (Agent: If no, request that the patient contact the pharmacy for the refill.)   Preferred Pharmacy (with phone number or street name):[facility]    Agent: Please be advised that RX refills may take up to 3 business days. We ask that you follow-up with your pharmacy.

## 2017-05-27 MED ORDER — FUROSEMIDE 20 MG PO TABS: 20.0000 mg | ORAL_TABLET | Freq: Every day | ORAL | 1 refills | Status: DC

## 2017-05-27 NOTE — Telephone Encounter (Signed)
Stable at 05/11/17 visit. 6 month's refills provided.

## 2017-05-27 NOTE — Telephone Encounter (Signed)
Pls advise want's Lasix refilled

## 2017-06-11 ENCOUNTER — Other Ambulatory Visit: Payer: Self-pay | Admitting: Family Medicine

## 2017-06-11 MED ORDER — IPRATROPIUM BROMIDE 0.03 % NA SOLN: 1.0000 | Freq: Two times a day (BID) | NASAL | 5 refills | Status: DC | PRN

## 2017-06-11 MED ORDER — OMEPRAZOLE 20 MG PO CPDR: 20.0000 mg | DELAYED_RELEASE_CAPSULE | Freq: Every day | ORAL | 1 refills | Status: DC

## 2017-06-11 NOTE — Telephone Encounter (Signed)
Refilled. Can discuss further at follow-up visit in 5 months

## 2017-06-11 NOTE — Telephone Encounter (Signed)
Patient presents in person to clinic to request refills of the following medications:  Requested Prescriptions   Pending Prescriptions Disp Refills  . omeprazole (PRILOSEC) 20 MG capsule      Sig: Take 1 capsule (20 mg total) by mouth daily.  Marland Kitchen ipratropium (ATROVENT) 0.03 % nasal spray 30 mL    Patient states these have been erroneously sent to [place] by his pharmacy. He would like refills of pended medications- have not been prescribed by Dr. Carlota Raspberry before. Patient had establish care visit with Dr. Carlota Raspberry in November 2018. Provider, please refill medications as appropriate.

## 2017-06-21 DIAGNOSIS — H903 Sensorineural hearing loss, bilateral: Secondary | ICD-10-CM | POA: Diagnosis not present

## 2017-06-21 DIAGNOSIS — H60542 Acute eczematoid otitis externa, left ear: Secondary | ICD-10-CM | POA: Diagnosis not present

## 2017-07-01 DIAGNOSIS — H26492 Other secondary cataract, left eye: Secondary | ICD-10-CM | POA: Diagnosis not present

## 2017-07-01 DIAGNOSIS — H04123 Dry eye syndrome of bilateral lacrimal glands: Secondary | ICD-10-CM | POA: Diagnosis not present

## 2017-07-01 DIAGNOSIS — H33312 Horseshoe tear of retina without detachment, left eye: Secondary | ICD-10-CM | POA: Diagnosis not present

## 2017-07-01 DIAGNOSIS — H33322 Round hole, left eye: Secondary | ICD-10-CM | POA: Diagnosis not present

## 2017-07-01 DIAGNOSIS — H401132 Primary open-angle glaucoma, bilateral, moderate stage: Secondary | ICD-10-CM | POA: Diagnosis not present

## 2017-07-01 DIAGNOSIS — H5203 Hypermetropia, bilateral: Secondary | ICD-10-CM | POA: Diagnosis not present

## 2017-07-01 DIAGNOSIS — Z961 Presence of intraocular lens: Secondary | ICD-10-CM | POA: Diagnosis not present

## 2017-07-01 DIAGNOSIS — H4089 Other specified glaucoma: Secondary | ICD-10-CM | POA: Diagnosis not present

## 2017-07-01 DIAGNOSIS — H2511 Age-related nuclear cataract, right eye: Secondary | ICD-10-CM | POA: Diagnosis not present

## 2017-07-13 DIAGNOSIS — R69 Illness, unspecified: Secondary | ICD-10-CM | POA: Diagnosis not present

## 2017-07-21 ENCOUNTER — Telehealth: Payer: Self-pay | Admitting: Family Medicine

## 2017-07-21 MED ORDER — ESCITALOPRAM OXALATE 20 MG PO TABS: 20.0000 mg | ORAL_TABLET | Freq: Every day | ORAL | 0 refills | Status: DC

## 2017-07-21 MED ORDER — SIMVASTATIN 20 MG PO TABS: 20.0000 mg | ORAL_TABLET | Freq: Every evening | ORAL | 0 refills | Status: DC

## 2017-07-21 NOTE — Telephone Encounter (Signed)
Late entry.   Patient presents in clinic to clarify his prescriptions. He is going to run out of both Lexapro 20mg  and Simvastatin 20mg  soon, would like them sent to [facility]. Spoke with Dr. Carlota Raspberry. Verbal order to refill both Lexapro 20mg  and Simvastatin #60 tablets with no refills given by Dr. Carlota Raspberry. He requests patient return for office visit in May. Patient scheduled for 09/02/2017.

## 2017-08-02 DIAGNOSIS — R69 Illness, unspecified: Secondary | ICD-10-CM | POA: Diagnosis not present

## 2017-08-09 DIAGNOSIS — M545 Low back pain: Secondary | ICD-10-CM | POA: Diagnosis not present

## 2017-08-09 DIAGNOSIS — M7062 Trochanteric bursitis, left hip: Secondary | ICD-10-CM | POA: Diagnosis not present

## 2017-08-11 ENCOUNTER — Other Ambulatory Visit: Payer: Self-pay | Admitting: *Deleted

## 2017-08-11 MED ORDER — ESCITALOPRAM OXALATE 20 MG PO TABS: 20.0000 mg | ORAL_TABLET | Freq: Every day | ORAL | 0 refills | Status: DC

## 2017-08-13 ENCOUNTER — Telehealth: Payer: Self-pay | Admitting: Family Medicine

## 2017-08-13 NOTE — Telephone Encounter (Signed)
See my chart note.

## 2017-09-02 ENCOUNTER — Other Ambulatory Visit: Payer: Self-pay

## 2017-09-02 ENCOUNTER — Encounter: Payer: Self-pay | Admitting: Family Medicine

## 2017-09-02 ENCOUNTER — Ambulatory Visit (INDEPENDENT_AMBULATORY_CARE_PROVIDER_SITE_OTHER): Payer: Medicare HMO | Admitting: Family Medicine

## 2017-09-02 VITALS — BP 142/88 | HR 67 | Temp 97.8°F | Ht 69.0 in | Wt 176.4 lb

## 2017-09-02 DIAGNOSIS — E785 Hyperlipidemia, unspecified: Secondary | ICD-10-CM

## 2017-09-02 DIAGNOSIS — R69 Illness, unspecified: Secondary | ICD-10-CM | POA: Diagnosis not present

## 2017-09-02 DIAGNOSIS — J309 Allergic rhinitis, unspecified: Secondary | ICD-10-CM | POA: Diagnosis not present

## 2017-09-02 DIAGNOSIS — I10 Essential (primary) hypertension: Secondary | ICD-10-CM

## 2017-09-02 DIAGNOSIS — K219 Gastro-esophageal reflux disease without esophagitis: Secondary | ICD-10-CM

## 2017-09-02 DIAGNOSIS — F411 Generalized anxiety disorder: Secondary | ICD-10-CM | POA: Diagnosis not present

## 2017-09-02 DIAGNOSIS — E039 Hypothyroidism, unspecified: Secondary | ICD-10-CM

## 2017-09-02 MED ORDER — SIMVASTATIN 20 MG PO TABS: 20.0000 mg | ORAL_TABLET | Freq: Every evening | ORAL | 3 refills | Status: DC

## 2017-09-02 MED ORDER — FLUTICASONE PROPIONATE 50 MCG/ACT NA SUSP: 1.0000 | Freq: Every day | NASAL | 6 refills | Status: DC

## 2017-09-02 MED ORDER — FUROSEMIDE 20 MG PO TABS: 20.0000 mg | ORAL_TABLET | Freq: Every day | ORAL | 1 refills | Status: DC

## 2017-09-02 MED ORDER — OMEPRAZOLE 20 MG PO CPDR: 20.0000 mg | DELAYED_RELEASE_CAPSULE | Freq: Every day | ORAL | 1 refills | Status: DC

## 2017-09-02 MED ORDER — LEVOTHYROXINE SODIUM 50 MCG PO TABS: 50.0000 ug | ORAL_TABLET | Freq: Every day | ORAL | 1 refills | Status: DC

## 2017-09-02 MED ORDER — DOXAZOSIN MESYLATE 8 MG PO TABS: 8.0000 mg | ORAL_TABLET | Freq: Every day | ORAL | 1 refills | Status: DC

## 2017-09-02 MED ORDER — LOSARTAN POTASSIUM 100 MG PO TABS: 100.0000 mg | ORAL_TABLET | Freq: Every day | ORAL | 1 refills | Status: DC

## 2017-09-02 MED ORDER — IPRATROPIUM BROMIDE 0.03 % NA SOLN: 1.0000 | Freq: Two times a day (BID) | NASAL | 5 refills | Status: DC | PRN

## 2017-09-02 MED ORDER — ESCITALOPRAM OXALATE 20 MG PO TABS: 20.0000 mg | ORAL_TABLET | Freq: Every day | ORAL | 3 refills | Status: DC

## 2017-09-02 NOTE — Progress Notes (Signed)
Subjective:  By signing my name below, I, [name], attest that this documentation has been prepared under the direction and in the presence of [name], MD Electronically Signed: Ladene Artist, ED Scribe 09/02/2017 at 9:51 AM.   Patient ID: [name], male    DOB: 1941-04-16, 77 y.o.   MRN: [id number]  Chief Complaint  Patient presents with  . Medication Refill    Lexapro and Zocor needs to be sent to [facility] (needs yr suply). All other meds go to [facility].   HPI [name] is a 77 y.o. male who presents to Primary Care at [facility] for f/u. Pt is not fasting at this visit; had cereal around 8:30 AM today.  Hypothyroidism Synthroid 50 mcg qd. Free T4 normal at .96. Plan for monitoring every 3-6 months. - Denies heat/cold intolerance, skin, nail, hair or weight changes, heart palpitations. Lab Results  Component Value Date   TSH 4.520 (H) 05/11/2017   HTN Lasix 20 mg qd, Cardura 8 mg qd, Losartan 100 mg qd. Followed by nephrology Dr. Neta Ehlers. - Home readings ranging from 110s-140s/60s-70s. Denies new side-effects.  Mood Disorder with H/o Anxiety Lexapro 20 mg qd. - Started Lexapro several yrs ago for anxiety and depression symptoms which he states has been doing well for him.  Hyperlipidemia Lab Results  Component Value Date   CHOL 141 03/16/2017   HDL 73 03/16/2017   LDLCALC 55 03/16/2017   TRIG 63 03/16/2017   CHOLHDL 1.9 03/16/2017   Lab Results  Component Value Date   ALT 16 03/16/2017   AST 18 03/16/2017   ALKPHOS 80 03/16/2017   BILITOT 0.5 03/16/2017  Zocor 20 mg qd.  GERD Omeprazole every morning for heartburn. Pt stopped meds once and noticed symptoms returned within 2 days. No h/o stomach ulcer.  Allergies Flonase and Atrovent daily. Also uses Xalatan eye drops.  Patient Active Problem List   Diagnosis Date Noted  . Hypertension 09/18/2016  . Benign essential hypertension 11/18/2015  . Acquired hypothyroidism 08/30/2015    . 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 Hypercholesteremia 08/30/2015  . 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 Anxiety   . BPH (benign prostatic hyperplasia)   . Bruises easily   . Cancer (HCC)    throat  . Complication of anesthesia    difficult urination after anesthesia  . Dizziness   . GERD (gastroesophageal reflux disease)   . Glaucoma    left worse than right  . H/O hiatal hernia   . Heart murmur   . Hyperlipemia   . Hypertension    takes meds daily  . Hypothyroidism   . Urinary retention    Past Surgical History:  Procedure Laterality Date  . ANTERIOR LAT LUMBAR FUSION  03/11/2012   Procedure: ANTERIOR LATERAL LUMBAR FUSION 1 LEVEL;  Surgeon: Charlie Pitter, MD;  Location: [place] [facility];  Service: Neurosurgery;  Laterality: Left;  Left lumbar four-five extreme lumbar interbody fusion with percutaneous pedicle screws   . BACK SURGERY     ruptured disc repair  . COLONOSCOPY W/ POLYPECTOMY    . EYE SURGERY     left cataract  . HERNIA REPAIR    . INGUINAL HERNIA REPAIR Right 05/22/2014   Procedure: REPAIR RECURRENT RIGHT INGUINAL HERNIA;  Surgeon: Doreen Salvage, MD;  Location: [place];  Service: General;  Laterality: Right;  . INSERTION OF MESH Right 05/22/2014   Procedure: INSERTION  OF MESH;  Surgeon: Doreen Salvage, MD;  Location: [place];  Service: General;  Laterality: Right;  . LUMBAR PERCUTANEOUS PEDICLE SCREW 1 LEVEL  03/11/2012   Procedure: LUMBAR PERCUTANEOUS PEDICLE SCREW 1 LEVEL;  Surgeon: Charlie Pitter, MD;  Location: [place] [facility];  Service: Neurosurgery;  Laterality: Left;  Left lumbar four-five extreme lumbar interbody fusion with percutaneous pedicle screws   . RADICAL NECK DISSECTION  1989  . ROTATOR CUFF REPAIR     right  . TONSILLECTOMY     Allergies  Allergen Reactions  . Codeine Nausea Only   Prior to Admission medications   Medication Sig Start Date End Date Taking? Authorizing Provider   aspirin 81 MG tablet Take 81 mg by mouth at bedtime.    [provider]  Calcium-Vitamin D-Vitamin K (VIACTIV PO) Take 1 capsule by mouth daily.    [provider]  dorzolamide-timolol (COSOPT) 22.3-6.8 MG/ML ophthalmic solution Place 1 drop into both eyes 2 (two) times daily.    [provider]  doxazosin (CARDURA) 8 MG tablet  03/03/17   [provider]  escitalopram (LEXAPRO) 20 MG tablet Take 1 tablet (20 mg total) by mouth daily. 08/11/17   [name], MD  fluticasone Asencion Islam) 50 MCG/ACT nasal spray  06/04/15   [provider]  furosemide (LASIX) 20 MG tablet Take 1 tablet (20 mg total) by mouth daily. 05/27/17   [name], MD  ipratropium (ATROVENT) 0.03 % nasal spray Place 1 spray into both nostrils 2 (two) times daily as needed for rhinitis. 06/11/17   [name], MD  latanoprost (XALATAN) 0.005 % ophthalmic solution Place 1 drop into both eyes at bedtime.    [provider]  levothyroxine (SYNTHROID, LEVOTHROID) 50 MCG tablet Take 1 tablet (50 mcg total) by mouth daily. 05/11/17   [name], MD  losartan (COZAAR) 100 MG tablet Take 1 tablet (100 mg total) by mouth daily. 05/11/17   [name], MD  mupirocin ointment (BACTROBAN) 2 % Apply topically. 07/31/15   [provider]  omeprazole (PRILOSEC) 20 MG capsule Take 1 capsule (20 mg total) by mouth daily. 06/11/17   [name], MD  simvastatin (ZOCOR) 20 MG tablet Take 1 tablet (20 mg total) by mouth every evening. 07/21/17   [name], MD  Tafluprost 0.0015 % SOLN Apply to eye. 09/15/16   [provider]   Social History   Socioeconomic History  . Marital status: Married    Spouse name: Not on file  . Number of children: Not on file  . Years of education: Not on file  . Highest education level: Not on file  Occupational History  . Not on file  Social Needs  . Financial resource strain: Not on file  . Food insecurity:     Worry: Not on file    Inability: Not on file  . Transportation needs:    Medical: Not on file    Non-medical: Not on file  Tobacco Use  . Smoking status: Former Research scientist (life sciences)  . Smokeless tobacco: Never Used  Substance and Sexual Activity  . Alcohol use: Yes    Comment: 1 or 2 beers a week  . Drug use: No  . Sexual activity: Never  Lifestyle  . Physical activity:    Days per week: Not on file    Minutes per session: Not on file  . Stress: Not on file  Relationships  . Social connections:    Talks  on phone: Not on file    Gets together: Not on file    Attends religious service: Not on file    Active member of club or organization: Not on file    Attends meetings of clubs or organizations: Not on file    Relationship status: Not on file  . Intimate partner violence:    Fear of current or ex partner: Not on file    Emotionally abused: Not on file    Physically abused: Not on file    Forced sexual activity: Not on file  Other Topics Concern  . Not on file  Social History Narrative  . Not on file   Review of Systems  Constitutional: Negative for fatigue and unexpected weight change.  Eyes: Negative for visual disturbance.  Respiratory: Negative for cough, chest tightness and shortness of breath.   Cardiovascular: Negative for chest pain, palpitations and leg swelling.  Gastrointestinal: Negative for abdominal pain and blood in stool.  Endocrine: Negative for cold intolerance and heat intolerance.  Skin: Negative for color change.  Neurological: Negative for dizziness, light-headedness and headaches.      Objective:   Physical Exam  Constitutional: He is oriented to person, place, and time. He appears well-developed and well-nourished.  HENT:  Head: Normocephalic and atraumatic.  Eyes: Pupils are equal, round, and reactive to light. EOM are normal.  Neck: No JVD present. Carotid bruit is not present.  Cardiovascular: Normal rate, regular rhythm and normal heart sounds.   No murmur heard. Pulmonary/Chest: Effort normal and breath sounds normal. He has no rales.  Musculoskeletal: He exhibits no edema.  Neurological: He is alert and oriented to person, place, and time.  Skin: Skin is warm and dry.  Psychiatric: He has a normal mood and affect.  Vitals reviewed.  Vitals:   09/02/17 0927 09/02/17 0934  BP: (!) 144/76 (!) 142/88  Pulse: 67   Temp: 97.8 F (36.6 C)   TempSrc: Oral   SpO2: 97%   Weight: 176 lb 6.4 oz (80 kg)   Height: 5\' 9"  (1.753 m)       Assessment & Plan:   [name] is a 77 y.o. male Allergic rhinitis, unspecified seasonality, unspecified trigger - Plan: ipratropium (ATROVENT) 0.03 % nasal spray, fluticasone (FLONASE) 50 MCG/ACT nasal spray  -Stable.  Continue same dose Flonase, Atrovent as needed.  Essential hypertension - Plan: losartan (COZAAR) 100 MG tablet, furosemide (LASIX) 20 MG tablet, doxazosin (CARDURA) 8 MG tablet  -Borderline elevated in office but home readings reviewed including some in the 110s.  Decided to continue same regimen for now, continue follow-up with nephrology.  Hypothyroidism, unspecified type - Plan: levothyroxine (SYNTHROID, LEVOTHROID) 50 MCG tablet, TSH + free T4  -Tolerating current dose of Synthroid, recheck TSH, free T4  Gastroesophageal reflux disease, esophagitis presence not specified - Plan: omeprazole (PRILOSEC) 20 MG capsule  -Tolerate omeprazole, option of spacing to every other day.  If continued daily need, would consider magnesium, B12, vitamin D, ferritin testing.  Hyperlipidemia, unspecified hyperlipidemia type - Plan: simvastatin (ZOCOR) 20 MG tablet, Comprehensive metabolic panel, Lipid panel  -Tolerating Zocor, continue same dose, labs pending  Anxiety state - Plan: escitalopram (LEXAPRO) 20 MG tablet  -Stable, would like to continue Lexapro.  Option of lower dose was discussed, can look at that further next visit if he would like.  Meds ordered this encounter   Medications  . simvastatin (ZOCOR) 20 MG tablet    Sig: Take 1 tablet (20 mg total) by  mouth every evening.    Dispense:  90 tablet    Refill:  3  . omeprazole (PRILOSEC) 20 MG capsule    Sig: Take 1 capsule (20 mg total) by mouth daily.    Dispense:  90 capsule    Refill:  1  . losartan (COZAAR) 100 MG tablet    Sig: Take 1 tablet (100 mg total) by mouth daily.    Dispense:  90 tablet    Refill:  1  . levothyroxine (SYNTHROID, LEVOTHROID) 50 MCG tablet    Sig: Take 1 tablet (50 mcg total) by mouth daily.    Dispense:  90 tablet    Refill:  1  . furosemide (LASIX) 20 MG tablet    Sig: Take 1 tablet (20 mg total) by mouth daily.    Dispense:  90 tablet    Refill:  1  . escitalopram (LEXAPRO) 20 MG tablet    Sig: Take 1 tablet (20 mg total) by mouth daily.    Dispense:  90 tablet    Refill:  3  . doxazosin (CARDURA) 8 MG tablet    Sig: Take 1 tablet (8 mg total) by mouth daily.    Dispense:  90 tablet    Refill:  1  . ipratropium (ATROVENT) 0.03 % nasal spray    Sig: Place 1 spray into both nostrils 2 (two) times daily as needed for rhinitis.    Dispense:  30 mL    Refill:  5  . fluticasone (FLONASE) 50 MCG/ACT nasal spray    Sig: Place 1 spray into both nostrils daily.    Dispense:  16 g    Refill:  6   Patient Instructions   You can try spacing out the omeprazole to every other day to see if that is just as effective for heartburn. Return to daily use if needed. If you do continue to need it daily, I will need to screen for nutritional deficiencies.   No change in blood pressure meds today.   I will recheck thyroid test today - no med changes for now.   Please return for fasting blood work in next 1 week. Lab only visit.   Thank you for coming in today. Repeat visit in 6 months.    IF you received an x-ray today, you will receive an invoice from [facility]. Please contact [facility] at [phone] with questions or concerns regarding your  invoice.   IF you received labwork today, you will receive an invoice from [facility]. Please contact [facility] at [phone] with questions or concerns regarding your invoice.   Our billing staff will not be able to assist you with questions regarding bills from these companies.  You will be contacted with the lab results as soon as they are available. The fastest way to get your results is to activate your My Chart account. Instructions are located on the last page of this paperwork. If you have not heard from [place] regarding the results in 2 weeks, please contact this office.       I personally performed the services described in this documentation, which was scribed in my presence. The recorded information has been reviewed and considered for accuracy and completeness, addended by me as needed, and agree with information above.  Signed,   Merri Ray, MD Primary Care at [place].  09/03/17 12:15 PM

## 2017-09-02 NOTE — Patient Instructions (Addendum)
You can try spacing out the omeprazole to every other day to see if that is just as effective for heartburn. Return to daily use if needed. If you do continue to need it daily, I will need to screen for nutritional deficiencies.   No change in blood pressure meds today.   I will recheck thyroid test today - no med changes for now.   Please return for fasting blood work in next 1 week. Lab only visit.   Thank you for coming in today. Repeat visit in 6 months.    IF you received an x-ray today, you will receive an invoice from [facility]. Please contact [facility] at [phone] with questions or concerns regarding your invoice.   IF you received labwork today, you will receive an invoice from [place]. Please contact [facility] at [phone] with questions or concerns regarding your invoice.   Our billing staff will not be able to assist you with questions regarding bills from these companies.  You will be contacted with the lab results as soon as they are available. The fastest way to get your results is to activate your My Chart account. Instructions are located on the last page of this paperwork. If you have not heard from [place] regarding the results in 2 weeks, please contact this office.

## 2017-09-03 ENCOUNTER — Encounter: Payer: Self-pay | Admitting: Family Medicine

## 2017-09-06 ENCOUNTER — Ambulatory Visit (INDEPENDENT_AMBULATORY_CARE_PROVIDER_SITE_OTHER): Payer: Medicare HMO | Admitting: Family Medicine

## 2017-09-06 DIAGNOSIS — E039 Hypothyroidism, unspecified: Secondary | ICD-10-CM | POA: Diagnosis not present

## 2017-09-06 DIAGNOSIS — E785 Hyperlipidemia, unspecified: Secondary | ICD-10-CM | POA: Diagnosis not present

## 2017-09-07 DIAGNOSIS — R69 Illness, unspecified: Secondary | ICD-10-CM | POA: Diagnosis not present

## 2017-09-07 LAB — LIPID PANEL
CHOLESTEROL TOTAL: 140 mg/dL (ref 100–199)
Chol/HDL Ratio: 1.9 ratio (ref 0.0–5.0)
HDL: 72 mg/dL (ref >39)
LDL Calculated: 52 mg/dL (ref 0–99)
Triglycerides: 80 mg/dL (ref 0–149)
VLDL CHOLESTEROL CAL: 16 mg/dL (ref 5–40)

## 2017-09-07 LAB — COMPREHENSIVE METABOLIC PANEL
ALBUMIN: 4.3 g/dL (ref 3.5–4.8)
ALT: 15 IU/L (ref 0–44)
AST: 16 IU/L (ref 0–40)
Albumin/Globulin Ratio: 2.2 (ref 1.2–2.2)
Alkaline Phosphatase: 77 IU/L (ref 39–117)
BILIRUBIN TOTAL: 0.8 mg/dL (ref 0.0–1.2)
BUN / CREAT RATIO: 11 (ref 10–24)
BUN: 13 mg/dL (ref 8–27)
CHLORIDE: 103 mmol/L (ref 96–106)
CO2: 28 mmol/L (ref 20–29)
Calcium: 9 mg/dL (ref 8.6–10.2)
Creatinine, Ser: 1.17 mg/dL (ref 0.76–1.27)
GFR calc Af Amer: 70 mL/min/{1.73_m2} (ref >59)
GFR calc non Af Amer: 60 mL/min/{1.73_m2} (ref >59)
GLUCOSE: 94 mg/dL (ref 65–99)
Globulin, Total: 2 g/dL (ref 1.5–4.5)
Potassium: 3.7 mmol/L (ref 3.5–5.2)
Sodium: 144 mmol/L (ref 134–144)
Total Protein: 6.3 g/dL (ref 6.0–8.5)

## 2017-09-07 LAB — TSH+FREE T4
FREE T4: 0.97 ng/dL (ref 0.82–1.77)
TSH: 6.41 u[IU]/mL — ABNORMAL HIGH (ref 0.450–4.500)

## 2017-09-07 NOTE — Progress Notes (Signed)
Lab visit only. 

## 2017-09-09 DIAGNOSIS — M5416 Radiculopathy, lumbar region: Secondary | ICD-10-CM | POA: Diagnosis not present

## 2017-09-21 DIAGNOSIS — M48061 Spinal stenosis, lumbar region without neurogenic claudication: Secondary | ICD-10-CM | POA: Diagnosis not present

## 2017-09-28 DIAGNOSIS — R69 Illness, unspecified: Secondary | ICD-10-CM | POA: Diagnosis not present

## 2017-10-04 DIAGNOSIS — R69 Illness, unspecified: Secondary | ICD-10-CM | POA: Diagnosis not present

## 2017-10-06 DIAGNOSIS — L820 Inflamed seborrheic keratosis: Secondary | ICD-10-CM | POA: Diagnosis not present

## 2017-10-06 DIAGNOSIS — Z85828 Personal history of other malignant neoplasm of skin: Secondary | ICD-10-CM | POA: Diagnosis not present

## 2017-10-06 DIAGNOSIS — D1801 Hemangioma of skin and subcutaneous tissue: Secondary | ICD-10-CM | POA: Diagnosis not present

## 2017-10-06 DIAGNOSIS — D692 Other nonthrombocytopenic purpura: Secondary | ICD-10-CM | POA: Diagnosis not present

## 2017-10-06 DIAGNOSIS — L570 Actinic keratosis: Secondary | ICD-10-CM | POA: Diagnosis not present

## 2017-10-14 DIAGNOSIS — Z6825 Body mass index (BMI) 25.0-25.9, adult: Secondary | ICD-10-CM | POA: Diagnosis not present

## 2017-10-14 DIAGNOSIS — M5416 Radiculopathy, lumbar region: Secondary | ICD-10-CM | POA: Diagnosis not present

## 2017-10-18 DIAGNOSIS — H60542 Acute eczematoid otitis externa, left ear: Secondary | ICD-10-CM | POA: Diagnosis not present

## 2017-10-18 DIAGNOSIS — J310 Chronic rhinitis: Secondary | ICD-10-CM | POA: Diagnosis not present

## 2017-10-21 DIAGNOSIS — K219 Gastro-esophageal reflux disease without esophagitis: Secondary | ICD-10-CM | POA: Diagnosis not present

## 2017-10-21 DIAGNOSIS — E039 Hypothyroidism, unspecified: Secondary | ICD-10-CM | POA: Diagnosis not present

## 2017-10-21 DIAGNOSIS — G8929 Other chronic pain: Secondary | ICD-10-CM | POA: Diagnosis not present

## 2017-10-21 DIAGNOSIS — R69 Illness, unspecified: Secondary | ICD-10-CM | POA: Diagnosis not present

## 2017-10-21 DIAGNOSIS — I10 Essential (primary) hypertension: Secondary | ICD-10-CM | POA: Diagnosis not present

## 2017-10-21 DIAGNOSIS — K222 Esophageal obstruction: Secondary | ICD-10-CM | POA: Diagnosis not present

## 2017-10-21 DIAGNOSIS — J302 Other seasonal allergic rhinitis: Secondary | ICD-10-CM | POA: Diagnosis not present

## 2017-10-21 DIAGNOSIS — H409 Unspecified glaucoma: Secondary | ICD-10-CM | POA: Diagnosis not present

## 2017-10-21 DIAGNOSIS — F419 Anxiety disorder, unspecified: Secondary | ICD-10-CM | POA: Diagnosis not present

## 2017-10-21 DIAGNOSIS — E785 Hyperlipidemia, unspecified: Secondary | ICD-10-CM | POA: Diagnosis not present

## 2017-10-22 DIAGNOSIS — M5416 Radiculopathy, lumbar region: Secondary | ICD-10-CM | POA: Diagnosis not present

## 2017-10-26 DIAGNOSIS — M5126 Other intervertebral disc displacement, lumbar region: Secondary | ICD-10-CM | POA: Diagnosis not present

## 2017-10-26 DIAGNOSIS — M5416 Radiculopathy, lumbar region: Secondary | ICD-10-CM | POA: Diagnosis not present

## 2017-10-26 DIAGNOSIS — M48061 Spinal stenosis, lumbar region without neurogenic claudication: Secondary | ICD-10-CM | POA: Diagnosis not present

## 2017-10-28 DIAGNOSIS — M5416 Radiculopathy, lumbar region: Secondary | ICD-10-CM | POA: Diagnosis not present

## 2017-10-29 DIAGNOSIS — H5203 Hypermetropia, bilateral: Secondary | ICD-10-CM | POA: Diagnosis not present

## 2017-10-29 DIAGNOSIS — H524 Presbyopia: Secondary | ICD-10-CM | POA: Diagnosis not present

## 2017-10-29 DIAGNOSIS — H52209 Unspecified astigmatism, unspecified eye: Secondary | ICD-10-CM | POA: Diagnosis not present

## 2017-10-30 ENCOUNTER — Other Ambulatory Visit: Payer: Self-pay | Admitting: Family Medicine

## 2017-10-30 DIAGNOSIS — M5416 Radiculopathy, lumbar region: Secondary | ICD-10-CM

## 2017-10-30 NOTE — Progress Notes (Unsigned)
Request received from Dr. Annette Stable at [facility] for BUN and creatinine for [name].  Lab only order placed.  He can have that performed at any point.  Once we have those results can be faxed to [facility] at [phone]. Let me know if there are questions.

## 2017-11-03 NOTE — Progress Notes (Signed)
Patient was informed he will come in for the blood work.

## 2017-11-09 ENCOUNTER — Ambulatory Visit: Payer: Medicare HMO | Admitting: Family Medicine

## 2017-11-17 DIAGNOSIS — Z0182 Encounter for allergy testing: Secondary | ICD-10-CM | POA: Diagnosis not present

## 2017-11-17 DIAGNOSIS — M5416 Radiculopathy, lumbar region: Secondary | ICD-10-CM | POA: Diagnosis not present

## 2017-12-15 DIAGNOSIS — I10 Essential (primary) hypertension: Secondary | ICD-10-CM | POA: Diagnosis not present

## 2017-12-23 ENCOUNTER — Other Ambulatory Visit: Payer: Self-pay | Admitting: Neurosurgery

## 2017-12-27 ENCOUNTER — Ambulatory Visit: Payer: Self-pay | Admitting: Family Medicine

## 2017-12-27 NOTE — Telephone Encounter (Signed)
Patient was out mowing this morning- and he was fine after that. Wife is concerned that he has had memory loss this afternoon.Patient is sleeping now.  Wife states patient had episode where he could not remember children names or doctors's names. She is going to wake him and see if he is aware of his surroundings and if he has has changes. She is instructed to call 911 if he is hard to awaken or has memory problems. She states he is trying to go back to sleep- she is going to call 911. Called wife back- husband is normal now- he is acting perfectly fine. Advised her to still call 911 and have him checked. BP-175/85  This could happen again if TIA.  Reason for Disposition . [1] Difficult to awaken or acting confused (e.g., disoriented, slurred speech) AND [2] present now AND [3] new onset  Answer Assessment - Initial Assessment Questions 1. MAIN CONCERN OR SYMPTOM:  "What is your main concern right now?" "What questions do you have?" "What's the main symptom you're worried about?" (e.g., confusion, memory loss)     memory loss- can not remember doctor's name, some of children's names 2. ONSET:  "When did the symptom start (or worsen)?" (minutes, hours, days, weeks)     This afternoon 3. BETTER-SAME-WORSE: "Are you getting better, staying the same, or getting worse compared to the day you were discharged?"     Not at this time 4.  DIAGNOSIS: "Was patient diagnosed with dementia by a doctor?" If yes, "When?" (e.g., days, months, years ago) No diagnosis 5.  MEDICATION: "Has there been any change in medications recently?" (e.g., narcotics, antihistamines, benzodiazepines, etc.) No changes 6.  OTHER SYMPTOMS: "Are there any other symptoms?" (e.g., fever, cough, pain, falling)     No other symptoms 7. SUPPORT: Document living circumstances and support (e.g., family, nursing home)     Lives with wife  Protocols used: DEMENTIA SYMPTOMS AND QUESTIONS-A-AH

## 2017-12-27 NOTE — Telephone Encounter (Signed)
Agree with prior recommendation.  I do not see ER note. Please call in am and recommend evaluation for his prior symptoms -in our office or ER.

## 2017-12-27 NOTE — Telephone Encounter (Signed)
fyi

## 2017-12-28 ENCOUNTER — Ambulatory Visit (HOSPITAL_COMMUNITY)
Admission: RE | Admit: 2017-12-28 | Discharge: 2017-12-28 | Disposition: A | Discharge status: Home / Self Care | Payer: Medicare HMO | Source: Ambulatory Visit | Attending: Family Medicine | Admitting: Family Medicine

## 2017-12-28 ENCOUNTER — Encounter: Payer: Self-pay | Admitting: Family Medicine

## 2017-12-28 ENCOUNTER — Other Ambulatory Visit: Payer: Self-pay

## 2017-12-28 ENCOUNTER — Ambulatory Visit (INDEPENDENT_AMBULATORY_CARE_PROVIDER_SITE_OTHER): Payer: Medicare HMO | Admitting: Family Medicine

## 2017-12-28 VITALS — BP 124/70 | HR 67 | Temp 97.7°F | Ht 69.0 in | Wt 176.4 lb

## 2017-12-28 DIAGNOSIS — R404 Transient alteration of awareness: Secondary | ICD-10-CM

## 2017-12-28 DIAGNOSIS — R51 Headache: Secondary | ICD-10-CM

## 2017-12-28 DIAGNOSIS — R5383 Other fatigue: Secondary | ICD-10-CM

## 2017-12-28 DIAGNOSIS — R4182 Altered mental status, unspecified: Secondary | ICD-10-CM | POA: Diagnosis not present

## 2017-12-28 DIAGNOSIS — R519 Headache, unspecified: Secondary | ICD-10-CM

## 2017-12-28 LAB — POCT CBC
Granulocyte percent: 60.9 %G (ref 37–80)
HCT, POC: 39.7 % — AB (ref 43.5–53.7)
Hemoglobin: 13.1 g/dL — AB (ref 14.1–18.1)
Lymph, poc: 1.3 (ref 0.6–3.4)
MCH: 30.9 pg (ref 27–31.2)
MCHC: 32.9 g/dL (ref 31.8–35.4)
MCV: 93.8 fL (ref 80–97)
MID (cbc): 0.3 (ref 0–0.9)
MPV: 7.9 fL (ref 0–99.8)
POC GRANULOCYTE: 2.4 (ref 2–6.9)
POC LYMPH %: 32.5 % (ref 10–50)
POC MID %: 6.6 %M (ref 0–12)
Platelet Count, POC: 155 10*3/uL (ref 142–424)
RBC: 4.24 M/uL — AB (ref 4.69–6.13)
RDW, POC: 12.5 %
WBC: 3.9 10*3/uL — AB (ref 4.6–10.2)

## 2017-12-28 LAB — BASIC METABOLIC PANEL
BUN/Creatinine Ratio: 12 (ref 10–24)
BUN: 14 mg/dL (ref 8–27)
CHLORIDE: 103 mmol/L (ref 96–106)
CO2: 27 mmol/L (ref 20–29)
Calcium: 9.4 mg/dL (ref 8.6–10.2)
Creatinine, Ser: 1.14 mg/dL (ref 0.76–1.27)
GFR calc non Af Amer: 62 mL/min/{1.73_m2} (ref >59)
GFR, EST AFRICAN AMERICAN: 71 mL/min/{1.73_m2} (ref >59)
Glucose: 98 mg/dL (ref 65–99)
Potassium: 4.1 mmol/L (ref 3.5–5.2)
SODIUM: 142 mmol/L (ref 134–144)

## 2017-12-28 LAB — GLUCOSE, POCT (MANUAL RESULT ENTRY): POC GLUCOSE: 87 mg/dL (ref 70–99)

## 2017-12-28 NOTE — Patient Instructions (Addendum)
  I am concerned about your symptoms from yesterday and lightheadedness. I will order a CT scan of your brain today, and check some electrolytes.  Your hemoglobin was borderline low, but unlikely cause of your symptoms yesterday.  I would recommend repeating that test in the next 3 to 5 days.  Depending on results from CT scan today, we can discuss next step with neurology.  I would also recommend discussing the blood pressure variability with your nephrologist to come up with the plan. Make sure to stay hydrated and regular meals.   If any return of confusion, worsening dizziness, or other worsening symptoms, call 911 or proceed to the emergency room.   If you have lab work done today you will be contacted with your lab results within the next 2 weeks.  If you have not heard from [place] then please contact us. The fastest way to get your results is to register for My Chart.   IF you received an x-ray today, you will receive an invoice from [facility]. Please contact [facility] at [phone] with questions or concerns regarding your invoice.   IF you received labwork today, you will receive an invoice from [place]. Please contact [facility] at [phone] with questions or concerns regarding your invoice.   Our billing staff will not be able to assist you with questions regarding bills from these companies.  You will be contacted with the lab results as soon as they are available. The fastest way to get your results is to activate your My Chart account. Instructions are located on the last page of this paperwork. If you have not heard from [place] regarding the results in 2 weeks, please contact this office.

## 2017-12-28 NOTE — Telephone Encounter (Signed)
Transferred to front to schedule appointment.

## 2017-12-28 NOTE — Progress Notes (Signed)
Subjective:  By signing my name below, I, [name], attest that this documentation has been prepared under the direction and in the presence of Derek Agreste, MD Electronically Signed: Ladene Artist, ED Scribe 12/28/2017 at 10:58 AM.   Patient ID: [name], male    DOB: 10/21/1940, 77 y.o.   MRN: [id number]  Chief Complaint  Patient presents with  . confused yesterday    dozed off and after 29min and was unable to recall children names, a hazy feeling came over him for a min or two   . dizzy when standing up just now   HPI [name] is a 77 y.o. male who presents to Primary Care at [facility] complaining of new onset of confusion. Episode of confusion yesterday afternoon. See phone notes. Had been mowing the yard prev. Wife stated he was unable to remember children or doctor names. After he woke he appeared to be improved. 911/ER eval recommended. H/o HTN, hyperlipidemia, BPH, laryngeal CA, hypothyroidism.  Pt states it felt like a "wave/fog" came over him x 15-20 minutes, similar to a flushing sensation, in which he had difficulty concentrating and confusion. Pt did check his BP a few moments after the incident, reading was 143/80. He does report occasional lightheadedness when he first stands which resolves within a few seconds. Also reports that he has been experiencing a frontal HA, pressure over sinuses and base of neck x 1 wk. States his BP has been running too high. Pt had an appointment on 8/14 with nephrologist Dr. Howell Pringle at [facility], cardura was increased to 8 mg bid. Phone call 8/22, surgery was delayed due to elevated BP, started clonidine 0.1 mg bid. Recent home BP readings 118-190/65-95, however, pt reports a reading of 60/40 after starting clonidine which he has since stopped. Denies fever, dizziness, cp, heart palpitations, sob, slurred speech, weakness in extremities, numbness.  Patient Active Problem List   Diagnosis Date Noted  .  Hypertension 09/18/2016  . Benign essential hypertension 11/18/2015  . Acquired hypothyroidism 08/30/2015  . 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 Hypercholesteremia 08/30/2015  . 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 Anxiety   . BPH (benign prostatic hyperplasia)   . Bruises easily   . Cancer (HCC)    throat  . Complication of anesthesia    difficult urination after anesthesia  . Dizziness   . GERD (gastroesophageal reflux disease)   . Glaucoma    left worse than right  . H/O hiatal hernia   . Heart murmur   . Hyperlipemia   . Hypertension    takes meds daily  . Hypothyroidism   . Urinary retention    Past Surgical History:  Procedure Laterality Date  . ANTERIOR LAT LUMBAR FUSION  03/11/2012   Procedure: ANTERIOR LATERAL LUMBAR FUSION 1 LEVEL;  Surgeon: Charlie Pitter, MD;  Location: [place] [facility];  Service: Neurosurgery;  Laterality: Left;  Left lumbar four-five extreme lumbar interbody fusion with percutaneous pedicle screws   . BACK SURGERY     ruptured disc repair  . COLONOSCOPY W/ POLYPECTOMY    . EYE SURGERY     left cataract  . HERNIA REPAIR    . INGUINAL HERNIA REPAIR Right 05/22/2014   Procedure: REPAIR RECURRENT RIGHT INGUINAL HERNIA;  Surgeon: Doreen Salvage, MD;  Location: [place];  Service: General;  Laterality: Right;  . INSERTION OF MESH Right 05/22/2014   Procedure: INSERTION  OF MESH;  Surgeon: Doreen Salvage, MD;  Location: [place];  Service: General;  Laterality: Right;  . LUMBAR PERCUTANEOUS PEDICLE SCREW 1 LEVEL  03/11/2012   Procedure: LUMBAR PERCUTANEOUS PEDICLE SCREW 1 LEVEL;  Surgeon: Charlie Pitter, MD;  Location: [place] [facility];  Service: Neurosurgery;  Laterality: Left;  Left lumbar four-five extreme lumbar interbody fusion with percutaneous pedicle screws   . RADICAL NECK DISSECTION  1989  . ROTATOR CUFF REPAIR     right  . TONSILLECTOMY     Allergies  Allergen Reactions  . Codeine Nausea Only    Prior to Admission medications   Medication Sig Start Date End Date Taking? Authorizing Provider  aspirin 81 MG tablet Take 81 mg by mouth at bedtime.    [provider]  Calcium-Vitamin D-Vitamin K (VIACTIV PO) Take 1 capsule by mouth daily.    [provider]  dorzolamide-timolol (COSOPT) 22.3-6.8 MG/ML ophthalmic solution Place 1 drop into both eyes 2 (two) times daily.    [provider]  doxazosin (CARDURA) 8 MG tablet Take 1 tablet (8 mg total) by mouth daily. 09/02/17   Derek Agreste, MD  escitalopram (LEXAPRO) 20 MG tablet Take 1 tablet (20 mg total) by mouth daily. 09/02/17   Derek Agreste, MD  fluticasone (FLONASE) 50 MCG/ACT nasal spray Place 1 spray into both nostrils daily. 09/02/17   Derek Agreste, MD  furosemide (LASIX) 20 MG tablet Take 1 tablet (20 mg total) by mouth daily. 09/02/17   Derek Agreste, MD  ipratropium (ATROVENT) 0.03 % nasal spray Place 1 spray into both nostrils 2 (two) times daily as needed for rhinitis. 09/02/17   Derek Agreste, MD  latanoprost (XALATAN) 0.005 % ophthalmic solution Place 1 drop into both eyes at bedtime.    [provider]  levothyroxine (SYNTHROID, LEVOTHROID) 50 MCG tablet Take 1 tablet (50 mcg total) by mouth daily. 09/02/17   Derek Agreste, MD  losartan (COZAAR) 100 MG tablet Take 1 tablet (100 mg total) by mouth daily. 09/02/17   Derek Agreste, MD  mupirocin ointment (BACTROBAN) 2 % Apply topically. 07/31/15   [provider]  omeprazole (PRILOSEC) 20 MG capsule Take 1 capsule (20 mg total) by mouth daily. 09/02/17   Derek Agreste, MD  simvastatin (ZOCOR) 20 MG tablet Take 1 tablet (20 mg total) by mouth every evening. 09/02/17   Derek Agreste, MD  Tafluprost 0.0015 % SOLN Apply to eye. 09/15/16   [provider]   Social History   Socioeconomic History  . Marital status: Married    Spouse name: Not on file  . Number of children: Not on file  . Years of education:  Not on file  . Highest education level: Not on file  Occupational History  . Not on file  Social Needs  . Financial resource strain: Not on file  . Food insecurity:    Worry: Not on file    Inability: Not on file  . Transportation needs:    Medical: Not on file    Non-medical: Not on file  Tobacco Use  . Smoking status: Former Research scientist (life sciences)  . Smokeless tobacco: Never Used  Substance and Sexual Activity  . Alcohol use: Yes    Comment: 1 or 2 beers a week  . Drug use: No  . Sexual activity: Never  Lifestyle  . Physical activity:    Days per week: Not on file    Minutes per session: Not on file  .  Stress: Not on file  Relationships  . Social connections:    Talks on phone: Not on file    Gets together: Not on file    Attends religious service: Not on file    Active member of club or organization: Not on file    Attends meetings of clubs or organizations: Not on file    Relationship status: Not on file  . Intimate partner violence:    Fear of current or ex partner: Not on file    Emotionally abused: Not on file    Physically abused: Not on file    Forced sexual activity: Not on file  Other Topics Concern  . Not on file  Social History Narrative  . Not on file   Review of Systems  Constitutional: Negative for fever.  HENT: Positive for sinus pressure.   Respiratory: Negative for shortness of breath.   Cardiovascular: Negative for chest pain and palpitations.  Musculoskeletal: Positive for neck pain.  Neurological: Positive for light-headedness (occasional) and headaches. Negative for dizziness, speech difficulty, weakness and numbness.  Psychiatric/Behavioral: Positive for confusion and decreased concentration.      Objective:   Physical Exam  Constitutional: He is oriented to person, place, and time. He appears well-developed and well-nourished.  HENT:  Head: Normocephalic and atraumatic.  Eyes: Pupils are equal, round, and reactive to light. Right eye exhibits no  nystagmus. Left eye exhibits no nystagmus.  Neck: No JVD present. Carotid bruit is not present.  Cardiovascular: Normal rate, regular rhythm and normal heart sounds.  No murmur heard. Pulmonary/Chest: Effort normal and breath sounds normal. He has no rales.  Musculoskeletal: He exhibits no edema.  Neurological: He is alert and oriented to person, place, and time. Gait normal.  No pronator drift. No focal weakness.  Skin: Skin is warm and dry.  Psychiatric: He has a normal mood and affect.  Vitals reviewed.  Vitals:   12/28/17 1026 12/28/17 1030  BP: (!) 94/55 124/70  Pulse: 67   Temp: 97.7 F (36.5 C)   TempSrc: Oral   SpO2: 99%   Weight: 176 lb 6.4 oz (80 kg)   Height: 5\' 9"  (1.753 m)    Results for orders placed or performed in visit on 12/28/17  POCT CBC  Result Value Ref Range   WBC 3.9 (A) 4.6 - 10.2 K/uL   Lymph, poc 1.3 0.6 - 3.4   POC LYMPH PERCENT 32.5 10 - 50 %L   MID (cbc) 0.3 0 - 0.9   POC MID % 6.6 0 - 12 %M   POC Granulocyte 2.4 2 - 6.9   Granulocyte percent 60.9 37 - 80 %G   RBC 4.24 (A) 4.69 - 6.13 M/uL   Hemoglobin 13.1 (A) 14.1 - 18.1 g/dL   HCT, POC 39.7 (A) 43.5 - 53.7 %   MCV 93.8 80 - 97 fL   MCH, POC 30.9 27 - 31.2 pg   MCHC 32.9 31.8 - 35.4 g/dL   RDW, POC 12.5 %   Platelet Count, POC 155 142 - 424 K/uL   MPV 7.9 0 - 99.8 fL  POCT glucose (manual entry)  Result Value Ref Range   POC Glucose 87 70 - 99 mg/dl   EKG reading done by Derek Agreste, MD: sinus bradycardia rate 56. No apparent sig change from 05/2014.    Assessment & Plan:  TAHJAE CLAUSING is a 77 y.o. male Fatigue, unspecified type - Plan: POCT CBC, POCT glucose (manual entry), Orthostatic vital signs,  Basic metabolic panel, EKG 75-[facility]  Acute nonintractable headache, unspecified headache type - Plan: CT Head Wo Contrast  Altered level of consciousness - Plan: CT Head Wo Contrast, Basic metabolic panel  Brief episode day prior as above.  Nonfocal neurologic exam, symptoms  have improved.  Has had recent variability in blood pressures and possible hypotension at that time.  CT head was obtained without concerning/acute findings.  Plan follow-up in 3 days.  ER/911 precautions given.  Additionally borderline low hemoglobin, plan to recheck next visit.  Unlikely cause of symptoms  No orders of the defined types were placed in this encounter.  Patient Instructions    I am concerned about your symptoms from yesterday and lightheadedness. I will order a CT scan of your brain today, and check some electrolytes.  Your hemoglobin was borderline low, but unlikely cause of your symptoms yesterday.  I would recommend repeating that test in the next 3 to 5 days.  Depending on results from CT scan today, we can discuss next step with neurology.  I would also recommend discussing the blood pressure variability with your nephrologist to come up with the plan. Make sure to stay hydrated and regular meals.   If any return of confusion, worsening dizziness, or other worsening symptoms, call 911 or proceed to the emergency room.   If you have lab work done today you will be contacted with your lab results within the next 2 weeks.  If you have not heard from [place] then please contact us. The fastest way to get your results is to register for My Chart.   IF you received an x-ray today, you will receive an invoice from [facility]. Please contact [facility] at [phone] with questions or concerns regarding your invoice.   IF you received labwork today, you will receive an invoice from [facility]. Please contact [facility] at [phone] with questions or concerns regarding your invoice.   Our billing staff will not be able to assist you with questions regarding bills from these companies.  You will be contacted with the lab results as soon as they are available. The fastest way to get your results is to activate your My Chart account. Instructions are located on the last  page of this paperwork. If you have not heard from [place] regarding the results in 2 weeks, please contact this office.       I personally performed the services described in this documentation, which was scribed in my presence. The recorded information has been reviewed and considered for accuracy and completeness, addended by me as needed, and agree with information above.  Signed,   Merri Ray, MD [facility] at [place].  12/31/17 10:44 AM

## 2017-12-29 DIAGNOSIS — H04123 Dry eye syndrome of bilateral lacrimal glands: Secondary | ICD-10-CM | POA: Diagnosis not present

## 2017-12-29 DIAGNOSIS — I10 Essential (primary) hypertension: Secondary | ICD-10-CM | POA: Diagnosis not present

## 2017-12-29 DIAGNOSIS — H35033 Hypertensive retinopathy, bilateral: Secondary | ICD-10-CM | POA: Diagnosis not present

## 2017-12-29 DIAGNOSIS — H401132 Primary open-angle glaucoma, bilateral, moderate stage: Secondary | ICD-10-CM | POA: Diagnosis not present

## 2017-12-29 NOTE — Progress Notes (Signed)
I have attempted to call pt. There was no answer and unable to leave VM.

## 2017-12-31 ENCOUNTER — Ambulatory Visit (INDEPENDENT_AMBULATORY_CARE_PROVIDER_SITE_OTHER): Payer: Medicare HMO | Admitting: Family Medicine

## 2017-12-31 ENCOUNTER — Other Ambulatory Visit: Payer: Self-pay

## 2017-12-31 ENCOUNTER — Encounter: Payer: Self-pay | Admitting: Family Medicine

## 2017-12-31 VITALS — BP 126/71 | HR 71 | Temp 98.2°F | Resp 16 | Ht 69.0 in | Wt 178.0 lb

## 2017-12-31 DIAGNOSIS — R404 Transient alteration of awareness: Secondary | ICD-10-CM | POA: Diagnosis not present

## 2017-12-31 DIAGNOSIS — R5383 Other fatigue: Secondary | ICD-10-CM | POA: Diagnosis not present

## 2017-12-31 NOTE — Patient Instructions (Addendum)
  Symptoms last visit may have been related to low blood pressure, but I will refer you to neurology to decide if other testing needed.  As symptoms have improved, I did not check other testing at this time.  Blood count likely will be repeated with your preoperative labs.   Return to the clinic or go to the nearest emergency room if any of your symptoms worsen or new symptoms occur.   If you have lab work done today you will be contacted with your lab results within the next 2 weeks.  If you have not heard from [place] then please contact us. The fastest way to get your results is to register for My Chart.   IF you received an x-ray today, you will receive an invoice from [facility]. Please contact [facility] at [phone] with questions or concerns regarding your invoice.   IF you received labwork today, you will receive an invoice from [facility]. Please contact [facility] at [phone] with questions or concerns regarding your invoice.   Our billing staff will not be able to assist you with questions regarding bills from these companies.  You will be contacted with the lab results as soon as they are available. The fastest way to get your results is to activate your My Chart account. Instructions are located on the last page of this paperwork. If you have not heard from [place] regarding the results in 2 weeks, please contact this office.

## 2017-12-31 NOTE — Progress Notes (Signed)
Subjective:  By signing my name below, I, [name], attest that this documentation has been prepared under the direction and in the presence of [name], MD Electronically Signed: [name], ED Scribe 12/31/2017 at 10:45 AM.   Patient ID: [name], male    DOB: 1940/10/05, 77 y.o.   MRN: [id number]  Chief Complaint  Patient presents with  . Headache    with some confusion 3 day follow-up, pt states he has had no headaches since last OV and he feels much better   . Fatigue    pt states the fatigue has been better, not feeling tired or weak since last OV    HPI [name] is a 77 y.o. male who presents to Primary Care at [facility] for f/u. At last OV he had episode of increased fatigue, poss transient confusion day prior. Had been having some variability in BPs. CT head neg for acute finding. Sig variability in BPs treated by nephrology and thought some symptoms may be due to low reading. - Pt denies any further fatigue, HA, confusion, unsteadiness, weakness, blood in stools, melena. He states that his nephrologist started him on Labetalol 100 mg bid which he is supposed to start today.  Patient Active Problem List   Diagnosis Date Noted  . Hypertension 09/18/2016  . Benign essential hypertension 11/18/2015  . Acquired hypothyroidism 08/30/2015  . 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 Hypercholesteremia 08/30/2015  . 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 Anxiety   . BPH (benign prostatic hyperplasia)   . Bruises easily   . Cancer (HCC)    throat  . Complication of anesthesia    difficult urination after anesthesia  . Dizziness   . GERD (gastroesophageal reflux disease)   . Glaucoma    left worse than right  . H/O hiatal hernia   . Heart murmur   . Hyperlipemia   . Hypertension    takes meds daily  . Hypothyroidism   . Urinary retention    Past Surgical History:  Procedure  Laterality Date  . ANTERIOR LAT LUMBAR FUSION  03/11/2012   Procedure: ANTERIOR LATERAL LUMBAR FUSION 1 LEVEL;  Surgeon: Charlie Pitter, MD;  Location: [place] [facility];  Service: Neurosurgery;  Laterality: Left;  Left lumbar four-five extreme lumbar interbody fusion with percutaneous pedicle screws   . BACK SURGERY     ruptured disc repair  . COLONOSCOPY W/ POLYPECTOMY    . EYE SURGERY     left cataract  . HERNIA REPAIR    . INGUINAL HERNIA REPAIR Right 05/22/2014   Procedure: REPAIR RECURRENT RIGHT INGUINAL HERNIA;  Surgeon: Doreen Salvage, MD;  Location: [place];  Service: General;  Laterality: Right;  . INSERTION OF MESH Right 05/22/2014   Procedure: INSERTION OF MESH;  Surgeon: Doreen Salvage, MD;  Location: [place];  Service: General;  Laterality: Right;  . LUMBAR PERCUTANEOUS PEDICLE SCREW 1 LEVEL  03/11/2012   Procedure: LUMBAR PERCUTANEOUS PEDICLE SCREW 1 LEVEL;  Surgeon: Charlie Pitter, MD;  Location: [place] [facility];  Service: Neurosurgery;  Laterality: Left;  Left lumbar four-five extreme lumbar interbody fusion with percutaneous pedicle screws   . RADICAL NECK DISSECTION  1989  . ROTATOR CUFF REPAIR     right  . TONSILLECTOMY     Allergies  Allergen Reactions  . Codeine Nausea Only   Prior to Admission medications   Medication Sig Start Date End Date  Taking? Authorizing Provider  aspirin 81 MG tablet Take 81 mg by mouth at bedtime.    [provider]  dorzolamide-timolol (COSOPT) 22.3-6.8 MG/ML ophthalmic solution Place 1 drop into both eyes 2 (two) times daily.    [provider]  doxazosin (CARDURA) 8 MG tablet Take 1 tablet (8 mg total) by mouth daily. 09/02/17   [name], MD  escitalopram (LEXAPRO) 20 MG tablet Take 1 tablet (20 mg total) by mouth daily. 09/02/17   [name], MD  fluticasone (FLONASE) 50 MCG/ACT nasal spray Place 1 spray into both nostrils daily. Patient taking differently: Place 1 spray into both nostrils daily as needed for allergies.  09/02/17    [name], MD  furosemide (LASIX) 20 MG tablet Take 1 tablet (20 mg total) by mouth daily. 09/02/17   [name], MD  ipratropium (ATROVENT) 0.03 % nasal spray Place 1 spray into both nostrils 2 (two) times daily as needed for rhinitis. 09/02/17   [name], MD  labetalol (NORMODYNE) 100 MG tablet Take 100 mg by mouth 2 (two) times daily.    [provider]  levothyroxine (SYNTHROID, LEVOTHROID) 50 MCG tablet Take 1 tablet (50 mcg total) by mouth daily. Patient taking differently: Take 50 mcg by mouth at bedtime.  09/02/17   [name], MD  losartan (COZAAR) 100 MG tablet Take 1 tablet (100 mg total) by mouth daily. 09/02/17   [name], MD  omeprazole (PRILOSEC) 20 MG capsule Take 1 capsule (20 mg total) by mouth daily. Patient taking differently: Take 20 mg by mouth every other day. IN THE MORNING 09/02/17   [name], MD  simvastatin (ZOCOR) 20 MG tablet Take 1 tablet (20 mg total) by mouth every evening. 09/02/17   [name], MD  Tafluprost 0.0015 % SOLN Place 1 drop into both eyes at bedtime. ZIOPTAN (tafluprost) 09/15/16   [provider]   Social History   Socioeconomic History  . Marital status: Married    Spouse name: Not on file  . Number of children: Not on file  . Years of education: Not on file  . Highest education level: Not on file  Occupational History  . Not on file  Social Needs  . Financial resource strain: Not on file  . Food insecurity:    Worry: Not on file    Inability: Not on file  . Transportation needs:    Medical: Not on file    Non-medical: Not on file  Tobacco Use  . Smoking status: Former Research scientist (life sciences)  . Smokeless tobacco: Never Used  Substance and Sexual Activity  . Alcohol use: Yes    Comment: 1 or 2 beers a week  . Drug use: No  . Sexual activity: Never  Lifestyle  . Physical activity:    Days per week: Not on file    Minutes per session: Not on file  . Stress: Not on file  Relationships    . Social connections:    Talks on phone: Not on file    Gets together: Not on file    Attends religious service: Not on file    Active member of club or organization: Not on file    Attends meetings of clubs or organizations: Not on file    Relationship status: Not on file  . Intimate partner violence:    Fear of current or ex partner: Not on file    Emotionally abused: Not on file  Physically abused: Not on file    Forced sexual activity: Not on file  Other Topics Concern  . Not on file  Social History Narrative  . Not on file   Review of Systems  Constitutional: Negative for fatigue.  Gastrointestinal: Negative for blood in stool.  Musculoskeletal: Negative for gait problem.  Neurological: Negative for weakness and headaches.  Psychiatric/Behavioral: Negative for confusion.      Objective:   Physical Exam  Constitutional: He is oriented to person, place, and time. He appears well-developed and well-nourished. No distress.  HENT:  Head: Normocephalic and atraumatic.  Eyes: Pupils are equal, round, and reactive to light. Conjunctivae and EOM are normal. Right eye exhibits no nystagmus. Left eye exhibits no nystagmus.  Neck: Neck supple. Carotid bruit is not present. No tracheal deviation present.  Cardiovascular: Normal rate, regular rhythm and normal heart sounds.  Pulmonary/Chest: Effort normal and breath sounds normal. No respiratory distress.  Musculoskeletal: Normal range of motion.  Neurological: He is alert and oriented to person, place, and time. He displays a negative Romberg sign. Gait normal.  No pronator drift. Non-focal neuro exam.  Skin: Skin is warm and dry.  Psychiatric: He has a normal mood and affect. His behavior is normal.  Nursing note and vitals reviewed.  Vitals:   12/31/17 1035  BP: 126/71  Pulse: 71  Resp: 16  Temp: 98.2 F (36.8 C)  TempSrc: Oral  SpO2: 97%  Weight: 178 lb (80.7 kg)  Height: 5\' 9"  (1.753 m)    Results for orders  placed or performed in visit on 83/15/17  Basic metabolic panel  Result Value Ref Range   Glucose 98 65 - 99 mg/dL   BUN 14 8 - 27 mg/dL   Creatinine, Ser 1.14 0.76 - 1.27 mg/dL   GFR calc non Af Amer 62 >59 mL/min/1.73   GFR calc Af Amer 71 >59 mL/min/1.73   BUN/Creatinine Ratio 12 10 - 24   Sodium 142 134 - 144 mmol/L   Potassium 4.1 3.5 - 5.2 mmol/L   Chloride 103 96 - 106 mmol/L   CO2 27 20 - 29 mmol/L   Calcium 9.4 8.6 - 10.2 mg/dL  POCT CBC  Result Value Ref Range   WBC 3.9 (A) 4.6 - 10.2 K/uL   Lymph, poc 1.3 0.6 - 3.4   POC LYMPH PERCENT 32.5 10 - 50 %L   MID (cbc) 0.3 0 - 0.9   POC MID % 6.6 0 - 12 %M   POC Granulocyte 2.4 2 - 6.9   Granulocyte percent 60.9 37 - 80 %G   RBC 4.24 (A) 4.69 - 6.13 M/uL   Hemoglobin 13.1 (A) 14.1 - 18.1 g/dL   HCT, POC 39.7 (A) 43.5 - 53.7 %   MCV 93.8 80 - 97 fL   MCH, POC 30.9 27 - 31.2 pg   MCHC 32.9 31.8 - 35.4 g/dL   RDW, POC 12.5 %   Platelet Count, POC 155 142 - 424 K/uL   MPV 7.9 0 - 99.8 fL  POCT glucose (manual entry)  Result Value Ref Range   POC Glucose 87 70 - 99 mg/dl      Assessment & Plan:  MYSHAWN CHIRIBOGA is a 77 y.o. male Fatigue, unspecified type - Plan: Ambulatory referral to Neurology  Altered consciousness - Plan: Ambulatory referral to Neurology  -Single episode of symptoms as above.  Has not had any recurrence of symptoms.  With the variations in blood pressure, could have had a  relative hypotensive episode.  He has a nonfocal neurologic exam, and CT was negative for acute findings.  Blood pressure stable at present, and symptoms have significantly improved.  -Refer to neurology to decide if further work-up needed for possible TIA but asymptomatic at present.  ER/911 precautions given if return of symptoms  No orders of the defined types were placed in this encounter.  Patient Instructions    Symptoms last visit may have been related to low blood pressure, but I will refer you to neurology to decide if  other testing needed.  As symptoms have improved, I did not check other testing at this time.  Blood count likely will be repeated with your preoperative labs.   Return to the clinic or go to the nearest emergency room if any of your symptoms worsen or new symptoms occur.   If you have lab work done today you will be contacted with your lab results within the next 2 weeks.  If you have not heard from [place] then please contact us. The fastest way to get your results is to register for My Chart.   IF you received an x-ray today, you will receive an invoice from [facility]. Please contact [facility] at [phone] with questions or concerns regarding your invoice.   IF you received labwork today, you will receive an invoice from [place]. Please contact [facility] at [phone] with questions or concerns regarding your invoice.   Our billing staff will not be able to assist you with questions regarding bills from these companies.  You will be contacted with the lab results as soon as they are available. The fastest way to get your results is to activate your My Chart account. Instructions are located on the last page of this paperwork. If you have not heard from [place] regarding the results in 2 weeks, please contact this office.       I personally performed the services described in this documentation, which was scribed in my presence. The recorded information has been reviewed and considered for accuracy and completeness, addended by me as needed, and agree with information above.  Signed,   Merri Ray, MD Primary [facility] at [place].  01/02/18 8:40 PM

## 2018-01-04 NOTE — Pre-Procedure Instructions (Signed)
[name] [name]  01/04/2018      [name] at [place], [name] [address] [place] [address] Phone: [phone] Fax: [place], [employer] [place] [address] Phone: [phone] Fax: [phone]    Your procedure is scheduled on September 10  Report to [place] at 0600 A.M.  Call this number if you have problems the morning of surgery:  [phone]   Remember:  Do not eat or drink after midnight.     Take these medicines the morning of surgery with A SIP OF WATER  Eye drops if needed doxazosin (CARDURA) escitalopram (LEXAPRO) fluticasone (FLONASE) ipratropium (ATROVENT)  labetalol (NORMODYNE)  omeprazole (PRILOSEC)   7 days prior to surgery STOP taking any Aspirin(unless otherwise instructed by your surgeon), Aleve, Naproxen, Ibuprofen, Motrin, Advil, Goody's, BC's, all herbal medications, fish oil, and all vitamins  Follow your surgeon's instructions on when to stop Asprin.  If no instructions were given by your surgeon then you will need to call the office to get those instructions.       Do not wear jewelry  Do not wear lotions, powders, or cologne, or deodorant.  Men may shave face and neck.  Do not bring valuables to the hospital.  [name] is not responsible for any belongings or valuables.  Contacts, dentures or bridgework may not be worn into surgery.  Leave your suitcase in the car.  After surgery it may be brought to your room.  For patients admitted to the hospital, discharge time will be determined by your treatment team.  Patients discharged the day of surgery will not be allowed to drive home.    Special instructions:   [name]  Before surgery, you can play an important role. Because skin is not sterile, your skin needs to be as free of germs as  possible. You can reduce the number of germs on your skin by washing with CHG (chlorahexidine gluconate) Soap before surgery.  CHG is an antiseptic cleaner which kills germs and bonds with the skin to continue killing germs even after washing.    Oral Hygiene is also important to reduce your risk of infection.  Remember - BRUSH YOUR TEETH THE MORNING OF SURGERY WITH YOUR REGULAR TOOTHPASTE  Please do not use if you have an allergy to CHG or antibacterial soaps. If your skin becomes reddened/irritated stop using the CHG.  Do not shave (including legs and underarms) for at least 48 hours prior to first CHG shower. It is OK to shave your face.  Please follow these instructions carefully.   1. Shower the NIGHT BEFORE SURGERY and the MORNING OF SURGERY with CHG.   2. If you chose to wash your hair, wash your hair first as usual with your normal shampoo.  3. After you shampoo, rinse your hair and body thoroughly to remove the shampoo.  4. Use CHG as you would any other liquid soap. You can apply CHG directly to the skin and wash gently with a scrungie or a clean washcloth.   5. Apply the CHG Soap to your body ONLY FROM THE NECK DOWN.  Do not use on open wounds or open sores. Avoid contact with your eyes, ears, mouth and genitals (private parts). Wash Face and genitals (private parts)  with your normal soap.  6. Wash  thoroughly, paying special attention to the area where your surgery will be performed.  7. Thoroughly rinse your body with warm water from the neck down.  8. DO NOT shower/wash with your normal soap after using and rinsing off the CHG Soap.  9. Pat yourself dry with a CLEAN TOWEL.  10. Wear CLEAN PAJAMAS to bed the night before surgery, wear comfortable clothes the morning of surgery  11. Place CLEAN SHEETS on your bed the night of your first shower and DO NOT SLEEP WITH PETS.    Day of Surgery:  Do not apply any deodorants/lotions.  Please wear clean clothes to the  hospital/surgery center.   Remember to brush your teeth WITH YOUR REGULAR TOOTHPASTE.    Please read over the following fact sheets that you were given.

## 2018-01-05 ENCOUNTER — Ambulatory Visit: Payer: Medicare HMO | Admitting: Neurology

## 2018-01-05 ENCOUNTER — Encounter (HOSPITAL_COMMUNITY)
Admission: RE | Admit: 2018-01-05 | Discharge: 2018-01-05 | Disposition: A | Discharge status: Home / Self Care | Payer: Medicare HMO | Source: Ambulatory Visit | Attending: Neurosurgery | Admitting: Neurosurgery

## 2018-01-05 ENCOUNTER — Other Ambulatory Visit: Payer: Self-pay

## 2018-01-05 ENCOUNTER — Encounter (HOSPITAL_COMMUNITY): Payer: Self-pay

## 2018-01-05 DIAGNOSIS — Z01812 Encounter for preprocedural laboratory examination: Secondary | ICD-10-CM | POA: Diagnosis not present

## 2018-01-05 DIAGNOSIS — Z79899 Other long term (current) drug therapy: Secondary | ICD-10-CM | POA: Diagnosis not present

## 2018-01-05 DIAGNOSIS — Z7982 Long term (current) use of aspirin: Secondary | ICD-10-CM | POA: Diagnosis not present

## 2018-01-05 DIAGNOSIS — E785 Hyperlipidemia, unspecified: Secondary | ICD-10-CM | POA: Diagnosis not present

## 2018-01-05 DIAGNOSIS — K219 Gastro-esophageal reflux disease without esophagitis: Secondary | ICD-10-CM | POA: Insufficient documentation

## 2018-01-05 DIAGNOSIS — I10 Essential (primary) hypertension: Secondary | ICD-10-CM | POA: Diagnosis not present

## 2018-01-05 DIAGNOSIS — N401 Enlarged prostate with lower urinary tract symptoms: Secondary | ICD-10-CM | POA: Diagnosis not present

## 2018-01-05 DIAGNOSIS — R300 Dysuria: Secondary | ICD-10-CM | POA: Diagnosis not present

## 2018-01-05 DIAGNOSIS — E039 Hypothyroidism, unspecified: Secondary | ICD-10-CM | POA: Insufficient documentation

## 2018-01-05 DIAGNOSIS — R350 Frequency of micturition: Secondary | ICD-10-CM | POA: Diagnosis not present

## 2018-01-05 HISTORY — DX: Unspecified osteoarthritis, unspecified site: M19.90

## 2018-01-05 HISTORY — DX: Transient alteration of awareness: R40.4

## 2018-01-05 LAB — CBC
HEMATOCRIT: 39.9 % (ref 39.0–52.0)
Hemoglobin: 13.4 g/dL (ref 13.0–17.0)
MCH: 33.4 pg (ref 26.0–34.0)
MCHC: 33.6 g/dL (ref 30.0–36.0)
MCV: 99.5 fL (ref 78.0–100.0)
PLATELETS: 189 10*3/uL (ref 150–400)
RBC: 4.01 MIL/uL — AB (ref 4.22–5.81)
RDW: 12.2 % (ref 11.5–15.5)
WBC: 4.7 10*3/uL (ref 4.0–10.5)

## 2018-01-05 LAB — BASIC METABOLIC PANEL
ANION GAP: 8 (ref 5–15)
BUN: 7 mg/dL — ABNORMAL LOW (ref 8–23)
CHLORIDE: 109 mmol/L (ref 98–111)
CO2: 24 mmol/L (ref 22–32)
Calcium: 9.5 mg/dL (ref 8.9–10.3)
Creatinine, Ser: 1.11 mg/dL (ref 0.61–1.24)
GFR calc Af Amer: >60 mL/min (ref >60)
GFR calc non Af Amer: >60 mL/min (ref >60)
Glucose, Bld: 122 mg/dL — ABNORMAL HIGH (ref 70–99)
POTASSIUM: 3.5 mmol/L (ref 3.5–5.1)
SODIUM: 141 mmol/L (ref 135–145)

## 2018-01-05 LAB — SURGICAL PCR SCREEN
MRSA, PCR: NEGATIVE
Staphylococcus aureus: NEGATIVE

## 2018-01-05 NOTE — Progress Notes (Addendum)
Anesthesia Chart Review:  Case:  [id number] Date/Time:  01/11/18 0745   Procedure:  Laminectomy and Foraminotomy - left - L3-L4 - L5-S1 (Left Back)   Anesthesia type:  General   Pre-op diagnosis:  Stenosis   Location:  [facility] OR ROOM 17 / [facility]   Surgeon:  Earnie Larsson, MD      DISCUSSION: Patient is a 77 year old male scheduled for the above procedure. Notes indicate that surgery was initially scheduled on 12/23/17, but was cancelled because his BP was too high. He has seen nephrology since with medication adjustments, and now surgery rescheduled for 01/11/18 at [facility]. However, now with pending neurology evaluation on 01/06/18 to evaluate recent episode of confusion/altered consciousness.   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 - 12/27/17 awoke from a nap confused ("unable to recall children names", "wave/fog" came over him x 15-20 minutes). Had been mowing lawn earlier in the day. Seen by PCP Dr. Carlota Raspberry on 12/28/17. Non-focal neurologic exam. Patient had recent variability in BP, so hypotension a possible etiology.  Complained of a headache. Head CT ordered and did not show any acute abnormality. 3 day follow-up recommended. - 12/29/17 nephrology follow-up with Dr. Howell Pringle for hypertensive medication adjustment. - 12/3017 follow-up with Dr. Carlota Raspberry. Reported fatigue, but no recurrent episodes of altered consciousness. Neurology referral made to determine if additional work-up for possible TIA indicated. - 01/06/18 has initial evaluation scheduled with neurologist Sarina Ill, MD.   Reported being instructed to hold ASA for 5 days prior to surgery.  Voice message left for Lorriane Shire at Dr. Marchelle Folks office regarding pending neurology input. If additional  work-up is needed then it may effect timing of surgery--will await neurology recommendations.  ADDENDUM 01/07/18 10:44 AM:  Patient was actually seen by neurologist Arlice Colt, MD on 01/06/18 for epidsode of transient alteration of awareness. According to his note, "There was no seizure activity noted and there were no obvious stroke symptoms at that time. CT scan shows more than expected for age atrophy and chronic microvascular ischemic change. There do no appear to be any acute findings. The etiology of the spells is unclear. Most likely he was dehydrated after mowing the yard. This might have hit him harder due to the atrophy and chronic microvascular ischemic change. I think seizure is unlikely but we need to check an EEG to make sure that there is not any epileptiform activity or focal slowing. If the spells recur, also consider an MRI." EEG is scheduled for 02/01/18. Dr. Felecia Shelling is aware of surgery plans.   Reviewed above with anesthesiologist Montez Hageman, MD. Seizure and CVA felt unlikely causes of patient's transient symptoms. If no recurrent issues or new symptoms then it is anticipated that he can proceed as planned from an anesthesia standpoint. I updated Lorriane Shire and asked that she update Dr. Annette Stable to ensure he did not have any additional recommendations prior to surgery.     VS: BP (!) 150/81   Pulse 64   Temp 36.6 C   Resp 18   Ht 5\' 9"  (1.753 m)   Wt 81.3 kg   SpO2 98%   BMI 26.46 kg/m   PROVIDERS: [name], MD is PCP - Terri Piedra, MD is nephrologist. Last visit 12/29/17 for HTN follow-up. Previously had added clonidine but patient discontinued  due to hypotension. Doxazosin reduced to once a day. Labetalol started. [facility], [place]) - Vallery Ridge, MD is ophthalmologist ([place]).  LABS: Labs reviewed: Acceptable for surgery. (all labs ordered are listed, but only abnormal results are displayed)  Labs Reviewed   BASIC METABOLIC PANEL - Abnormal; Notable for the following components:      Result Value   Glucose, Bld 122 (*)    BUN 7 (*)    All other components within normal limits  CBC - Abnormal; Notable for the following components:   RBC 4.01 (*)    All other components within normal limits  SURGICAL PCR SCREEN    IMAGES: CT head 12/28/17:  IMPRESSION: 1. No acute intracranial process. 2. Borderline parenchymal brain volume loss for age. 3. Moderate chronic small vessel ischemic changes.   EKG: 12/28/17: SB at 56 bpm, old inferior infarct. Non-specific T wave abnormality. EKG appears stable when compared to tracing from 05/17/14.    CV: N/A  Past Medical History:  Diagnosis Date  . Anxiety   . Arthritis   . BPH (benign prostatic hyperplasia)   . Bruises easily   . Cancer (HCC)    throat  . Complication of anesthesia    difficult urination after anesthesia  . Dizziness   . GERD (gastroesophageal reflux disease)   . Glaucoma    left worse than right  . H/O hiatal hernia   . Heart murmur   . Hyperlipemia   . Hypertension    takes meds daily  . Hypothyroidism   . Transient alteration of awareness    ~ 12/27/17, evaluated by neurologist Dr. Felecia Shelling 01/06/18  . Urinary retention     Past Surgical History:  Procedure Laterality Date  . ANTERIOR LAT LUMBAR FUSION  03/11/2012   Procedure: ANTERIOR LATERAL LUMBAR FUSION 1 LEVEL;  Surgeon: Charlie Pitter, MD;  Location: [place] [facility];  Service: Neurosurgery;  Laterality: Left;  Left lumbar four-five extreme lumbar interbody fusion with percutaneous pedicle screws   . BACK SURGERY     ruptured disc repair  . COLONOSCOPY W/ POLYPECTOMY    . EYE SURGERY     left cataract  . HERNIA REPAIR    . INGUINAL HERNIA REPAIR Right 05/22/2014   Procedure: REPAIR RECURRENT RIGHT INGUINAL HERNIA;  Surgeon: Doreen Salvage, MD;  Location: [place];  Service: General;  Laterality: Right;  . INSERTION OF MESH Right 05/22/2014   Procedure: INSERTION OF MESH;   Surgeon: Doreen Salvage, MD;  Location: [place];  Service: General;  Laterality: Right;  . LUMBAR PERCUTANEOUS PEDICLE SCREW 1 LEVEL  03/11/2012   Procedure: LUMBAR PERCUTANEOUS PEDICLE SCREW 1 LEVEL;  Surgeon: Charlie Pitter, MD;  Location: [place] [facility];  Service: Neurosurgery;  Laterality: Left;  Left lumbar four-five extreme lumbar interbody fusion with percutaneous pedicle screws   . RADICAL NECK DISSECTION  1989  . ROTATOR CUFF REPAIR     right  . TONSILLECTOMY      MEDICATIONS: . aspirin 81 MG tablet  . dorzolamide-timolol (COSOPT) 22.3-6.8 MG/ML ophthalmic solution  . doxazosin (CARDURA) 8 MG tablet  . escitalopram (LEXAPRO) 20 MG tablet  . fluticasone (FLONASE) 50 MCG/ACT nasal spray  . furosemide (LASIX) 20 MG tablet  . ipratropium (ATROVENT) 0.03 % nasal spray  . labetalol (NORMODYNE) 100 MG tablet  . levothyroxine (SYNTHROID, LEVOTHROID) 50 MCG tablet  . losartan (COZAAR) 100 MG tablet  . omeprazole (PRILOSEC) 20 MG capsule  . simvastatin (ZOCOR) 20 MG tablet  .  Tafluprost 0.0015 % SOLN   No current facility-administered medications for this encounter.     George Hugh [facility] Short Stay Center/Anesthesiology Phone [phone] 01/05/2018 4:29 PM

## 2018-01-05 NOTE — Progress Notes (Signed)
PCP - Merri Ray Cardiologist - denies  Chest x-ray - denies EKG - denies Stress Test - denies  ECHO - denies Cardiac Cath - denies  Aspirin Instructions: stop 5 days prior  Anesthesia review: yes, recent questionable TIA (Aug. 2019), pt has an appt with Neurologist today on 01-05-18  Patient denies shortness of breath, fever, cough and chest pain at PAT appointment   Patient verbalized understanding of instructions that were given to them at the PAT appointment. Patient was also instructed that they will need to review over the PAT instructions again at home before surgery.

## 2018-01-06 ENCOUNTER — Ambulatory Visit: Payer: Medicare HMO | Admitting: Neurology

## 2018-01-06 ENCOUNTER — Encounter: Payer: Self-pay | Admitting: Neurology

## 2018-01-06 VITALS — BP 148/80 | HR 63 | Resp 18 | Ht 69.0 in | Wt 179.0 lb

## 2018-01-06 DIAGNOSIS — R404 Transient alteration of awareness: Secondary | ICD-10-CM | POA: Diagnosis not present

## 2018-01-06 DIAGNOSIS — R930 Abnormal findings on diagnostic imaging of skull and head, not elsewhere classified: Secondary | ICD-10-CM | POA: Diagnosis not present

## 2018-01-06 DIAGNOSIS — M48062 Spinal stenosis, lumbar region with neurogenic claudication: Secondary | ICD-10-CM | POA: Diagnosis not present

## 2018-01-06 NOTE — Progress Notes (Signed)
[facility]  PATIENT: [name] DOB: 1941/03/15  REFERRING DOCTOR OR PCP:  Cindee Lame SOURCE:   _________________________________   HISTORICAL  CHIEF COMPLAINT:  Chief Complaint  Patient presents with  . Fatigue    [name] is here for eval of a single 15 min. episode of fatigue and mild confusion--"like I was in a fog."  Sts. episode was following mowing his lawn.  Sx. completely resolved and have not returned/fim  . Altered Awareness    HISTORY OF PRESENT ILLNESS:  I had the pleasure seeing patient, [name], at [facility] for neurologic consultation regarding his episode of mild mental confusion.  He is a 77 yo man had a 15 minute episode of altered cognition.    He had just mowed the yard (Visual merchandiser).     He came inside and about 5 minutes later, his wife gave [name] a name of someone to make an appointment and he didn't recognize the name which was unusual.   He felt he was in a mental fog.     He notes temperatures were not too high that day (low to mid 80's) and he does yardwork all the time.     He does not think he was dehydrated as he has ben drinking water multiple times that day.    His wife, a former nurse starting asking [name] orientation like questions and he made some errors.   He felt in a fog but there was no loss of consciousness.   He has never had a similar episode.   He did not have any convulsions, tonic clonic activity, tongue biting, incontinence.     Of note, doxazosin had ben started a few days before the event.   He did not feel lightheaded.   He went to his PCP the next day and had a CT scan.  I personally reviewed the CT dated 12/29/2017.   There are no acute findings.    He has moderate generalized cortical atrophy and moderate chronic microvascular ischemic changes.     He has a lot of spine issues and will be having surgery at L2-L4 next week.    Pain radiates down the leg and when more severe it  radiates up his back.    His left leg has a limp and is mildly limp.  Pain is worse in the left leg.    REVIEW OF SYSTEMS: Constitutional: No fevers, chills, sweats, or change in appetite Eyes: No visual changes, double vision, eye pain.   He has glaucoma Ear, nose and throat: No hearing loss, ear pain, nasal congestion, sore throat Cardiovascular: No chest pain, palpitations Respiratory: No shortness of breath at rest or with exertion.   No wheezes GastrointestinaI: No nausea, vomiting, diarrhea, abdominal pain, fecal incontinence Genitourinary: No dysuria, urinary retention or frequency.  No nocturia. Musculoskeletal: No neck pain, back pain Integumentary: No rash, pruritus, skin lesions Neurological: as above Psychiatric: Mood is fine now on Lexapro but he had some depression earlier endocrine: No palpitations, diaphoresis, change in appetite, change in weigh or increased thirst Hematologic/Lymphatic: No anemia, purpura, petechiae. Allergic/Immunologic: No itchy/runny eyes, nasal congestion, recent allergic reactions, rashes  ALLERGIES: Allergies  Allergen Reactions  . Codeine Nausea Only    HOME MEDICATIONS:  Current Outpatient Medications:  .  aspirin 81 MG tablet, Take 81 mg by mouth at bedtime., Disp: , Rfl:  .  dorzolamide-timolol (COSOPT) 22.3-6.8 MG/ML ophthalmic solution, Place 1 drop into both eyes 2 (two) times  daily., Disp: , Rfl:  .  doxazosin (CARDURA) 8 MG tablet, Take 1 tablet (8 mg total) by mouth daily., Disp: 90 tablet, Rfl: 1 .  escitalopram (LEXAPRO) 20 MG tablet, Take 1 tablet (20 mg total) by mouth daily., Disp: 90 tablet, Rfl: 3 .  fluticasone (FLONASE) 50 MCG/ACT nasal spray, Place 1 spray into both nostrils daily. (Patient taking differently: Place 1 spray into both nostrils daily as needed for allergies. ), Disp: 16 g, Rfl: 6 .  furosemide (LASIX) 20 MG tablet, Take 1 tablet (20 mg total) by mouth daily., Disp: 90 tablet, Rfl: 1 .  ipratropium  (ATROVENT) 0.03 % nasal spray, Place 1 spray into both nostrils 2 (two) times daily as needed for rhinitis., Disp: 30 mL, Rfl: 5 .  labetalol (NORMODYNE) 100 MG tablet, Take 100 mg by mouth 2 (two) times daily., Disp: , Rfl:  .  levothyroxine (SYNTHROID, LEVOTHROID) 50 MCG tablet, Take 1 tablet (50 mcg total) by mouth daily. (Patient taking differently: Take 50 mcg by mouth at bedtime. ), Disp: 90 tablet, Rfl: 1 .  losartan (COZAAR) 100 MG tablet, Take 1 tablet (100 mg total) by mouth daily., Disp: 90 tablet, Rfl: 1 .  omeprazole (PRILOSEC) 20 MG capsule, Take 1 capsule (20 mg total) by mouth daily. (Patient taking differently: Take 20 mg by mouth every other day. IN THE MORNING), Disp: 90 capsule, Rfl: 1 .  simvastatin (ZOCOR) 20 MG tablet, Take 1 tablet (20 mg total) by mouth every evening., Disp: 90 tablet, Rfl: 3 .  Tafluprost 0.0015 % SOLN, Place 1 drop into both eyes at bedtime. ZIOPTAN (tafluprost), Disp: , Rfl:   PAST MEDICAL HISTORY: Past Medical History:  Diagnosis Date  . Anxiety   . Arthritis   . BPH (benign prostatic hyperplasia)   . Bruises easily   . Cancer (HCC)    throat  . Complication of anesthesia    difficult urination after anesthesia  . Dizziness   . GERD (gastroesophageal reflux disease)   . Glaucoma    left worse than right  . H/O hiatal hernia   . Heart murmur   . Hyperlipemia   . Hypertension    takes meds daily  . Hypothyroidism   . Stroke [facility])    TIA - August 2019 - Questionable, has an appointment with neurologist on 01-05-18  . Urinary retention     PAST SURGICAL HISTORY: Past Surgical History:  Procedure Laterality Date  . ANTERIOR LAT LUMBAR FUSION  03/11/2012   Procedure: ANTERIOR LATERAL LUMBAR FUSION 1 LEVEL;  Surgeon: Charlie Pitter, MD;  Location: [place] [facility];  Service: Neurosurgery;  Laterality: Left;  Left lumbar four-five extreme lumbar interbody fusion with percutaneous pedicle screws   . BACK SURGERY     ruptured disc repair  .  COLONOSCOPY W/ POLYPECTOMY    . EYE SURGERY     left cataract  . HERNIA REPAIR    . INGUINAL HERNIA REPAIR Right 05/22/2014   Procedure: REPAIR RECURRENT RIGHT INGUINAL HERNIA;  Surgeon: Doreen Salvage, MD;  Location: [place];  Service: General;  Laterality: Right;  . INSERTION OF MESH Right 05/22/2014   Procedure: INSERTION OF MESH;  Surgeon: Doreen Salvage, MD;  Location: [place];  Service: General;  Laterality: Right;  . LUMBAR PERCUTANEOUS PEDICLE SCREW 1 LEVEL  03/11/2012   Procedure: LUMBAR PERCUTANEOUS PEDICLE SCREW 1 LEVEL;  Surgeon: Charlie Pitter, MD;  Location: [place] [facility];  Service: Neurosurgery;  Laterality: Left;  Left lumbar four-five extreme  lumbar interbody fusion with percutaneous pedicle screws   . RADICAL NECK DISSECTION  1989  . ROTATOR CUFF REPAIR     right  . TONSILLECTOMY      FAMILY HISTORY: No family history on file.  SOCIAL HISTORY:  Social History   Socioeconomic History  . Marital status: Married    Spouse name: Not on file  . Number of children: Not on file  . Years of education: Not on file  . Highest education level: Not on file  Occupational History  . Not on file  Social Needs  . Financial resource strain: Not on file  . Food insecurity:    Worry: Not on file    Inability: Not on file  . Transportation needs:    Medical: Not on file    Non-medical: Not on file  Tobacco Use  . Smoking status: Former Research scientist (life sciences)  . Smokeless tobacco: Never Used  Substance and Sexual Activity  . Alcohol use: Yes    Comment: 1 or 2 beers a week  . Drug use: No  . Sexual activity: Never  Lifestyle  . Physical activity:    Days per week: Not on file    Minutes per session: Not on file  . Stress: Not on file  Relationships  . Social connections:    Talks on phone: Not on file    Gets together: Not on file    Attends religious service: Not on file    Active member of club or organization: Not on file    Attends meetings of clubs or organizations: Not on file     Relationship status: Not on file  . Intimate partner violence:    Fear of current or ex partner: Not on file    Emotionally abused: Not on file    Physically abused: Not on file    Forced sexual activity: Not on file  Other Topics Concern  . Not on file  Social History Narrative  . Not on file     PHYSICAL EXAM  Vitals:   01/06/18 1503  BP: (!) 148/80  Pulse: 63  Resp: 18  Weight: 179 lb (81.2 kg)  Height: 5\' 9"  (1.753 m)    Body mass index is 26.43 kg/m.   General: The patient is well-developed and well-nourished and in no acute distress   Neck: The neck is supple, no carotid bruits are noted.     Cardiovascular: The heart has a regular rate and rhythm with a normal S1 and S2. There were no murmurs, gallops or rubs.  Skin: Extremities are without rash or edema  Musculoskeletal:  Back is nontender  Neurologic Exam  Mental status: The patient is alert and oriented x 2 1/2 at the time of the examination (01/08/2018) . The patient has apparent normal recent and remote memory, with an apparently normal attention span (100-93-86-79-72-65) and concentration ability.   Speech is normal.  Cranial nerves: Extraocular movements are full. Pupils are equal, round, and reactive to light and accomodation.  Visual fields are full.  Facial symmetry is present. There is good facial sensation to soft touch bilaterally.Facial strength is normal.  Trapezius and sternocleidomastoid strength is normal. No dysarthria is noted.  The tongue is midline, and the patient has symmetric elevation of the soft palate. No obvious hearing deficits are noted.  Motor:  Muscle bulk is normal.   Tone is normal. Strength is  5 / 5 in all 4 extremities except 4+/5 strength in the left EHL  Sensory:  Sensory testing is intact to pinprick, soft touch and vibration sensation in all 4 extremities.  Coordination: Cerebellar testing reveals good finger-nose-finger and heel-to-shin bilaterally.  Gait and station:  Station is normal.   Gait is arthritic.  Has a slight left limp/foot drop.  Tandem gait is wide. Romberg is negative.   Reflexes: Deep tendon reflexes are symmetric and normal bilaterally.   Plantar responses are flexor.    DIAGNOSTIC DATA (LABS, IMAGING, TESTING) - I reviewed patient records, labs, notes, testing and imaging myself where available.  Lab Results  Component Value Date   WBC 4.7 01/05/2018   HGB 13.4 01/05/2018   HCT 39.9 01/05/2018   MCV 99.5 01/05/2018   PLT 189 01/05/2018      Component Value Date/Time   NA 141 01/05/2018 0955   NA 142 12/28/2017 1119   K 3.5 01/05/2018 0955   CL 109 01/05/2018 0955   CO2 24 01/05/2018 0955   GLUCOSE 122 (H) 01/05/2018 0955   BUN 7 (L) 01/05/2018 0955   BUN 14 12/28/2017 1119   CREATININE 1.11 01/05/2018 0955   CALCIUM 9.5 01/05/2018 0955   PROT 6.3 09/06/2017 0910   ALBUMIN 4.3 09/06/2017 0910   AST 16 09/06/2017 0910   ALT 15 09/06/2017 0910   ALKPHOS 77 09/06/2017 0910   BILITOT 0.8 09/06/2017 0910   GFRNONAA >60 01/05/2018 0955   GFRAA >60 01/05/2018 0955   Lab Results  Component Value Date   CHOL 140 09/06/2017   HDL 72 09/06/2017   LDLCALC 52 09/06/2017   TRIG 80 09/06/2017   CHOLHDL 1.9 09/06/2017   No results found for: HGBA1C No results found for: VITAMINB12 Lab Results  Component Value Date   TSH 6.410 (H) 09/06/2017       ASSESSMENT AND PLAN  Transient alteration of awareness - Plan: EEG adult  Lumbar stenosis with neurogenic claudication  Abnormal head CT   In summary, [name] is a 77 year old man who had a 15-minute episode where he was not thinking clearly.  There was no seizure activity noted and there were no obvious stroke symptoms at the time.  CT scan shows more than expected for age atrophy and chronic microvascular ischemic change.  There do not appear to be any acute findings.  The etiology of the spells is unclear.  Most likely he was dehydrated after mowing the yard.  This  might have hit [name] harder due to the atrophy and chronic microvascular ischemic change.     I think seizure is unlikely but we need to check an EEG to make sure that there is not any epileptiform activity or focal slowing.   If the spells recur, also consider an MRI.  He will return to see me as needed and we will call [name] with the results of the EEG.  He should call us if more spells recur or other neurologic symptoms occur.  Thank you for asking me to see [name].  Please let me know if I can be of further assistance with [name] or other patients in the future.     Richard A. Felecia Shelling, MD, [facility] 07/08/1060, 6:94 PM Certified in Neurology, Clinical Neurophysiology, Sleep Medicine, Pain Medicine and Neuroimaging  [facility] [address] [place], [place] [address] [phone]

## 2018-01-07 ENCOUNTER — Encounter (HOSPITAL_COMMUNITY): Payer: Self-pay

## 2018-01-10 DIAGNOSIS — I10 Essential (primary) hypertension: Secondary | ICD-10-CM | POA: Diagnosis not present

## 2018-01-11 ENCOUNTER — Ambulatory Visit (HOSPITAL_COMMUNITY): Payer: Medicare HMO | Admitting: Vascular Surgery

## 2018-01-11 ENCOUNTER — Ambulatory Visit (HOSPITAL_COMMUNITY): Payer: Medicare HMO

## 2018-01-11 ENCOUNTER — Encounter (HOSPITAL_COMMUNITY): Payer: Self-pay | Admitting: Anesthesiology

## 2018-01-11 ENCOUNTER — Observation Stay (HOSPITAL_COMMUNITY)
Admission: RE | Admit: 2018-01-11 | Discharge: 2018-01-12 | Disposition: A | Discharge status: Home / Self Care | Payer: Medicare HMO | Source: Ambulatory Visit | Attending: Neurosurgery | Admitting: Neurosurgery

## 2018-01-11 ENCOUNTER — Encounter (HOSPITAL_COMMUNITY)
Admission: RE | Disposition: A | Discharge status: Home / Self Care | Payer: Self-pay | Source: Ambulatory Visit | Attending: Neurosurgery

## 2018-01-11 ENCOUNTER — Ambulatory Visit (HOSPITAL_COMMUNITY): Payer: Medicare HMO | Admitting: Anesthesiology

## 2018-01-11 DIAGNOSIS — Z7982 Long term (current) use of aspirin: Secondary | ICD-10-CM | POA: Diagnosis not present

## 2018-01-11 DIAGNOSIS — Z885 Allergy status to narcotic agent status: Secondary | ICD-10-CM | POA: Insufficient documentation

## 2018-01-11 DIAGNOSIS — R42 Dizziness and giddiness: Secondary | ICD-10-CM | POA: Diagnosis not present

## 2018-01-11 DIAGNOSIS — K219 Gastro-esophageal reflux disease without esophagitis: Secondary | ICD-10-CM | POA: Insufficient documentation

## 2018-01-11 DIAGNOSIS — H409 Unspecified glaucoma: Secondary | ICD-10-CM | POA: Insufficient documentation

## 2018-01-11 DIAGNOSIS — N400 Enlarged prostate without lower urinary tract symptoms: Secondary | ICD-10-CM | POA: Diagnosis not present

## 2018-01-11 DIAGNOSIS — K449 Diaphragmatic hernia without obstruction or gangrene: Secondary | ICD-10-CM | POA: Insufficient documentation

## 2018-01-11 DIAGNOSIS — E785 Hyperlipidemia, unspecified: Secondary | ICD-10-CM | POA: Insufficient documentation

## 2018-01-11 DIAGNOSIS — M1990 Unspecified osteoarthritis, unspecified site: Secondary | ICD-10-CM | POA: Diagnosis not present

## 2018-01-11 DIAGNOSIS — M4726 Other spondylosis with radiculopathy, lumbar region: Principal | ICD-10-CM | POA: Insufficient documentation

## 2018-01-11 DIAGNOSIS — M4727 Other spondylosis with radiculopathy, lumbosacral region: Secondary | ICD-10-CM | POA: Diagnosis not present

## 2018-01-11 DIAGNOSIS — Z419 Encounter for procedure for purposes other than remedying health state, unspecified: Secondary | ICD-10-CM

## 2018-01-11 DIAGNOSIS — Z981 Arthrodesis status: Secondary | ICD-10-CM | POA: Insufficient documentation

## 2018-01-11 DIAGNOSIS — F419 Anxiety disorder, unspecified: Secondary | ICD-10-CM | POA: Diagnosis not present

## 2018-01-11 DIAGNOSIS — M5416 Radiculopathy, lumbar region: Secondary | ICD-10-CM | POA: Diagnosis present

## 2018-01-11 DIAGNOSIS — Z85819 Personal history of malignant neoplasm of unspecified site of lip, oral cavity, and pharynx: Secondary | ICD-10-CM | POA: Diagnosis not present

## 2018-01-11 DIAGNOSIS — Z87891 Personal history of nicotine dependence: Secondary | ICD-10-CM | POA: Insufficient documentation

## 2018-01-11 DIAGNOSIS — R011 Cardiac murmur, unspecified: Secondary | ICD-10-CM | POA: Insufficient documentation

## 2018-01-11 DIAGNOSIS — M48061 Spinal stenosis, lumbar region without neurogenic claudication: Secondary | ICD-10-CM | POA: Diagnosis not present

## 2018-01-11 DIAGNOSIS — Z79899 Other long term (current) drug therapy: Secondary | ICD-10-CM | POA: Diagnosis not present

## 2018-01-11 DIAGNOSIS — E039 Hypothyroidism, unspecified: Secondary | ICD-10-CM | POA: Insufficient documentation

## 2018-01-11 DIAGNOSIS — I10 Essential (primary) hypertension: Secondary | ICD-10-CM | POA: Insufficient documentation

## 2018-01-11 DIAGNOSIS — R69 Illness, unspecified: Secondary | ICD-10-CM | POA: Diagnosis not present

## 2018-01-11 DIAGNOSIS — E7800 Pure hypercholesterolemia, unspecified: Secondary | ICD-10-CM | POA: Diagnosis not present

## 2018-01-11 DIAGNOSIS — M47817 Spondylosis without myelopathy or radiculopathy, lumbosacral region: Secondary | ICD-10-CM | POA: Diagnosis present

## 2018-01-11 DIAGNOSIS — M4807 Spinal stenosis, lumbosacral region: Secondary | ICD-10-CM | POA: Diagnosis not present

## 2018-01-11 HISTORY — PX: LUMBAR LAMINECTOMY/DECOMPRESSION MICRODISCECTOMY: SHX5026

## 2018-01-11 SURGERY — LUMBAR LAMINECTOMY/DECOMPRESSION MICRODISCECTOMY 2 LEVELS
Anesthesia: General | Site: Back | Laterality: Left

## 2018-01-11 MED ORDER — ESCITALOPRAM OXALATE 20 MG PO TABS
20.0000 mg | ORAL_TABLET | Freq: Every day | ORAL | Status: DC
  Administered 2018-01-12: 20 mg via ORAL
  Filled 2018-01-11: qty 1

## 2018-01-11 MED ORDER — HYDROCODONE-ACETAMINOPHEN 5-325 MG PO TABS
1.0000 | ORAL_TABLET | ORAL | Status: DC | PRN
  Administered 2018-01-11 – 2018-01-12 (×6): 1 via ORAL
  Filled 2018-01-11 (×5): qty 1

## 2018-01-11 MED ORDER — PHENOL 1.4 % MT LIQD: 1.0000 | OROMUCOSAL | Status: DC | PRN

## 2018-01-11 MED ORDER — ACETAMINOPHEN 650 MG RE SUPP: 650.0000 mg | RECTAL | Status: DC | PRN

## 2018-01-11 MED ORDER — LABETALOL HCL 100 MG PO TABS
100.0000 mg | ORAL_TABLET | Freq: Two times a day (BID) | ORAL | Status: DC
  Administered 2018-01-11 – 2018-01-12 (×2): 100 mg via ORAL
  Filled 2018-01-11 (×2): qty 1

## 2018-01-11 MED ORDER — SODIUM CHLORIDE 0.9 % IV SOLN
INTRAVENOUS | Status: DC | PRN
  Administered 2018-01-11: 10:00:00

## 2018-01-11 MED ORDER — LABETALOL HCL 5 MG/ML IV SOLN
5.0000 mg | INTRAVENOUS | Status: AC | PRN
  Administered 2018-01-11 (×2): 5 mg via INTRAVENOUS

## 2018-01-11 MED ORDER — SODIUM CHLORIDE 0.9% FLUSH: 3.0000 mL | INTRAVENOUS | Status: DC | PRN

## 2018-01-11 MED ORDER — SODIUM CHLORIDE 0.9 % IV SOLN
INTRAVENOUS | Status: DC | PRN
  Administered 2018-01-11: 50 ug/min via INTRAVENOUS

## 2018-01-11 MED ORDER — HYDROMORPHONE HCL 1 MG/ML IJ SOLN: 1.0000 mg | INTRAMUSCULAR | Status: DC | PRN

## 2018-01-11 MED ORDER — HEMOSTATIC AGENTS (NO CHARGE) OPTIME
TOPICAL | Status: DC | PRN
  Administered 2018-01-11: 1 via TOPICAL

## 2018-01-11 MED ORDER — LACTATED RINGERS IV SOLN: INTRAVENOUS | Status: DC

## 2018-01-11 MED ORDER — SODIUM CHLORIDE 0.9 % IV SOLN: 250.0000 mL | INTRAVENOUS | Status: DC

## 2018-01-11 MED ORDER — LACTATED RINGERS IV SOLN
INTRAVENOUS | Status: DC
  Administered 2018-01-11: 10 mL/h via INTRAVENOUS

## 2018-01-11 MED ORDER — SUGAMMADEX SODIUM 200 MG/2ML IV SOLN
INTRAVENOUS | Status: DC | PRN
  Administered 2018-01-11: 200 mg via INTRAVENOUS

## 2018-01-11 MED ORDER — FLUTICASONE PROPIONATE 50 MCG/ACT NA SUSP
1.0000 | Freq: Every day | NASAL | Status: DC | PRN
  Filled 2018-01-11: qty 16

## 2018-01-11 MED ORDER — LACTATED RINGERS IV SOLN
INTRAVENOUS | Status: DC | PRN
  Administered 2018-01-11 (×2): via INTRAVENOUS

## 2018-01-11 MED ORDER — ALBUMIN HUMAN 5 % IV SOLN
INTRAVENOUS | Status: DC | PRN
  Administered 2018-01-11: 11:00:00 via INTRAVENOUS

## 2018-01-11 MED ORDER — CEFDINIR 300 MG PO CAPS: 300.0000 mg | ORAL_CAPSULE | Freq: Two times a day (BID) | ORAL | Status: DC

## 2018-01-11 MED ORDER — SODIUM CHLORIDE 0.9% FLUSH: 3.0000 mL | Freq: Two times a day (BID) | INTRAVENOUS | Status: DC

## 2018-01-11 MED ORDER — ASPIRIN 81 MG PO CHEW
81.0000 mg | CHEWABLE_TABLET | Freq: Every day | ORAL | Status: DC
  Filled 2018-01-11: qty 1

## 2018-01-11 MED ORDER — LATANOPROST 0.005 % OP SOLN
1.0000 [drp] | Freq: Every day | OPHTHALMIC | Status: DC
  Filled 2018-01-11: qty 2.5

## 2018-01-11 MED ORDER — ACETAMINOPHEN 325 MG PO TABS: 650.0000 mg | ORAL_TABLET | ORAL | Status: DC | PRN

## 2018-01-11 MED ORDER — HYDROCODONE-ACETAMINOPHEN 5-325 MG PO TABS
ORAL_TABLET | ORAL | Status: AC
  Filled 2018-01-11: qty 1

## 2018-01-11 MED ORDER — THROMBIN 5000 UNITS EX SOLR
CUTANEOUS | Status: AC
  Filled 2018-01-11: qty 10000

## 2018-01-11 MED ORDER — DOXAZOSIN MESYLATE 8 MG PO TABS
8.0000 mg | ORAL_TABLET | Freq: Every day | ORAL | Status: DC
  Administered 2018-01-12: 8 mg via ORAL
  Filled 2018-01-11: qty 1

## 2018-01-11 MED ORDER — 0.9 % SODIUM CHLORIDE (POUR BTL) OPTIME
TOPICAL | Status: DC | PRN
  Administered 2018-01-11: 1000 mL

## 2018-01-11 MED ORDER — CEFAZOLIN SODIUM-DEXTROSE 1-4 GM/50ML-% IV SOLN
1.0000 g | Freq: Three times a day (TID) | INTRAVENOUS | Status: AC
  Administered 2018-01-11 – 2018-01-12 (×2): 1 g via INTRAVENOUS
  Filled 2018-01-11 (×2): qty 50

## 2018-01-11 MED ORDER — LEVOTHYROXINE SODIUM 100 MCG PO TABS
50.0000 ug | ORAL_TABLET | Freq: Every day | ORAL | Status: DC
  Administered 2018-01-11: 50 ug via ORAL
  Filled 2018-01-11: qty 1

## 2018-01-11 MED ORDER — THROMBIN 5000 UNITS EX SOLR
CUTANEOUS | Status: DC | PRN
  Administered 2018-01-11: 10000 [IU] via TOPICAL

## 2018-01-11 MED ORDER — KETOROLAC TROMETHAMINE 15 MG/ML IJ SOLN
30.0000 mg | Freq: Four times a day (QID) | INTRAMUSCULAR | Status: DC
  Administered 2018-01-11 – 2018-01-12 (×3): 30 mg via INTRAVENOUS
  Filled 2018-01-11 (×3): qty 2

## 2018-01-11 MED ORDER — LOSARTAN POTASSIUM 50 MG PO TABS
100.0000 mg | ORAL_TABLET | Freq: Every day | ORAL | Status: DC
  Administered 2018-01-11: 100 mg via ORAL
  Filled 2018-01-11: qty 2

## 2018-01-11 MED ORDER — PANTOPRAZOLE SODIUM 40 MG PO TBEC
40.0000 mg | DELAYED_RELEASE_TABLET | Freq: Every day | ORAL | Status: DC
  Administered 2018-01-11 – 2018-01-12 (×2): 40 mg via ORAL
  Filled 2018-01-11 (×2): qty 1

## 2018-01-11 MED ORDER — MEPERIDINE HCL 50 MG/ML IJ SOLN: 6.2500 mg | INTRAMUSCULAR | Status: DC | PRN

## 2018-01-11 MED ORDER — ONDANSETRON HCL 4 MG/2ML IJ SOLN
INTRAMUSCULAR | Status: DC | PRN
  Administered 2018-01-11: 4 mg via INTRAVENOUS

## 2018-01-11 MED ORDER — FENTANYL CITRATE (PF) 250 MCG/5ML IJ SOLN
INTRAMUSCULAR | Status: AC
  Filled 2018-01-11: qty 5

## 2018-01-11 MED ORDER — ONDANSETRON HCL 4 MG PO TABS: 4.0000 mg | ORAL_TABLET | Freq: Four times a day (QID) | ORAL | Status: DC | PRN

## 2018-01-11 MED ORDER — PHENYLEPHRINE HCL 10 MG/ML IJ SOLN
INTRAMUSCULAR | Status: DC | PRN
  Administered 2018-01-11: 100 ug via INTRAVENOUS

## 2018-01-11 MED ORDER — BUPIVACAINE HCL (PF) 0.25 % IJ SOLN
INTRAMUSCULAR | Status: DC | PRN
  Administered 2018-01-11: 20 mL

## 2018-01-11 MED ORDER — IPRATROPIUM BROMIDE 0.06 % NA SOLN
1.0000 | Freq: Two times a day (BID) | NASAL | Status: DC | PRN
  Filled 2018-01-11: qty 15

## 2018-01-11 MED ORDER — LIDOCAINE HCL (CARDIAC) PF 100 MG/5ML IV SOSY
PREFILLED_SYRINGE | INTRAVENOUS | Status: DC | PRN
  Administered 2018-01-11: 30 mg via INTRAVENOUS

## 2018-01-11 MED ORDER — FENTANYL CITRATE (PF) 100 MCG/2ML IJ SOLN
25.0000 ug | INTRAMUSCULAR | Status: DC | PRN
  Administered 2018-01-11: 25 ug via INTRAVENOUS

## 2018-01-11 MED ORDER — ROCURONIUM BROMIDE 50 MG/5ML IV SOSY
PREFILLED_SYRINGE | INTRAVENOUS | Status: DC | PRN
  Administered 2018-01-11: 50 mg via INTRAVENOUS

## 2018-01-11 MED ORDER — LABETALOL HCL 5 MG/ML IV SOLN
INTRAVENOUS | Status: AC
  Filled 2018-01-11: qty 4

## 2018-01-11 MED ORDER — DORZOLAMIDE HCL-TIMOLOL MAL 2-0.5 % OP SOLN
1.0000 [drp] | Freq: Two times a day (BID) | OPHTHALMIC | Status: DC
  Administered 2018-01-11: 1 [drp] via OPHTHALMIC
  Filled 2018-01-11: qty 10

## 2018-01-11 MED ORDER — FUROSEMIDE 20 MG PO TABS
20.0000 mg | ORAL_TABLET | Freq: Every day | ORAL | Status: DC
  Administered 2018-01-11 – 2018-01-12 (×2): 20 mg via ORAL
  Filled 2018-01-11 (×2): qty 1

## 2018-01-11 MED ORDER — MENTHOL 3 MG MT LOZG: 1.0000 | LOZENGE | OROMUCOSAL | Status: DC | PRN

## 2018-01-11 MED ORDER — DEXAMETHASONE SODIUM PHOSPHATE 10 MG/ML IJ SOLN
10.0000 mg | INTRAMUSCULAR | Status: AC
  Administered 2018-01-11: 10 mg via INTRAVENOUS
  Filled 2018-01-11: qty 1

## 2018-01-11 MED ORDER — HYDRALAZINE HCL 20 MG/ML IJ SOLN: 10.0000 mg | INTRAMUSCULAR | Status: DC | PRN

## 2018-01-11 MED ORDER — CYCLOBENZAPRINE HCL 10 MG PO TABS
10.0000 mg | ORAL_TABLET | Freq: Three times a day (TID) | ORAL | Status: DC | PRN
  Administered 2018-01-11 (×2): 10 mg via ORAL
  Filled 2018-01-11 (×2): qty 1

## 2018-01-11 MED ORDER — PROPOFOL 10 MG/ML IV BOLUS
INTRAVENOUS | Status: AC
  Filled 2018-01-11: qty 40

## 2018-01-11 MED ORDER — CEFAZOLIN SODIUM-DEXTROSE 2-4 GM/100ML-% IV SOLN
2.0000 g | INTRAVENOUS | Status: AC
  Administered 2018-01-11: 2 g via INTRAVENOUS
  Filled 2018-01-11: qty 100

## 2018-01-11 MED ORDER — HYDROCODONE-ACETAMINOPHEN 10-325 MG PO TABS: 2.0000 | ORAL_TABLET | ORAL | Status: DC | PRN

## 2018-01-11 MED ORDER — ONDANSETRON HCL 4 MG/2ML IJ SOLN: 4.0000 mg | Freq: Four times a day (QID) | INTRAMUSCULAR | Status: DC | PRN

## 2018-01-11 MED ORDER — BUPIVACAINE HCL (PF) 0.25 % IJ SOLN
INTRAMUSCULAR | Status: AC
  Filled 2018-01-11: qty 30

## 2018-01-11 MED ORDER — FENTANYL CITRATE (PF) 100 MCG/2ML IJ SOLN
INTRAMUSCULAR | Status: AC
  Filled 2018-01-11: qty 2

## 2018-01-11 MED ORDER — CYCLOBENZAPRINE HCL 10 MG PO TABS
ORAL_TABLET | ORAL | Status: AC
  Filled 2018-01-11: qty 1

## 2018-01-11 MED ORDER — PROMETHAZINE HCL 25 MG/ML IJ SOLN: 6.2500 mg | INTRAMUSCULAR | Status: DC | PRN

## 2018-01-11 MED ORDER — KETOROLAC TROMETHAMINE 15 MG/ML IJ SOLN
INTRAMUSCULAR | Status: DC | PRN
  Administered 2018-01-11: 15 mg via INTRAVENOUS

## 2018-01-11 MED ORDER — FENTANYL CITRATE (PF) 100 MCG/2ML IJ SOLN
INTRAMUSCULAR | Status: DC | PRN
  Administered 2018-01-11: 100 ug via INTRAVENOUS

## 2018-01-11 MED ORDER — PROPOFOL 10 MG/ML IV BOLUS
INTRAVENOUS | Status: DC | PRN
  Administered 2018-01-11: 10 mg via INTRAVENOUS

## 2018-01-11 MED ORDER — SIMVASTATIN 20 MG PO TABS
20.0000 mg | ORAL_TABLET | Freq: Every evening | ORAL | Status: DC
  Administered 2018-01-11: 20 mg via ORAL
  Filled 2018-01-11: qty 1

## 2018-01-11 SURGICAL SUPPLY — 48 items
ADH SKN CLS APL DERMABOND .7 (GAUZE/BANDAGES/DRESSINGS) ×1
APL SKNCLS STERI-STRIP NONHPOA (GAUZE/BANDAGES/DRESSINGS) ×1
BAG DECANTER FOR FLEXI CONT (MISCELLANEOUS) ×2 IMPLANT
BENZOIN TINCTURE PRP APPL 2/3 (GAUZE/BANDAGES/DRESSINGS) ×2 IMPLANT
BLADE CLIPPER SURG (BLADE) IMPLANT
BUR CUTTER 7.0 ROUND (BURR) ×2 IMPLANT
CANISTER SUCT 3000ML PPV (MISCELLANEOUS) ×2 IMPLANT
CARTRIDGE OIL MAESTRO DRILL (MISCELLANEOUS) ×1 IMPLANT
DECANTER SPIKE VIAL GLASS SM (MISCELLANEOUS) ×2 IMPLANT
DERMABOND ADVANCED (GAUZE/BANDAGES/DRESSINGS) ×1
DERMABOND ADVANCED .7 DNX12 (GAUZE/BANDAGES/DRESSINGS) ×1 IMPLANT
DIFFUSER DRILL AIR PNEUMATIC (MISCELLANEOUS) ×2 IMPLANT
DRAPE HALF SHEET 40X57 (DRAPES) ×1 IMPLANT
DRAPE LAPAROTOMY 100X72X124 (DRAPES) ×2 IMPLANT
DRAPE MICROSCOPE LEICA (MISCELLANEOUS) ×2 IMPLANT
DRAPE SURG 17X23 STRL (DRAPES) ×4 IMPLANT
DRSG OPSITE POSTOP 4X6 (GAUZE/BANDAGES/DRESSINGS) ×1 IMPLANT
DURAPREP 26ML APPLICATOR (WOUND CARE) ×2 IMPLANT
ELECT REM PT RETURN 9FT ADLT (ELECTROSURGICAL) ×2
ELECTRODE REM PT RTRN 9FT ADLT (ELECTROSURGICAL) ×1 IMPLANT
GAUZE 4X4 16PLY RFD (DISPOSABLE) IMPLANT
GAUZE SPONGE 4X4 12PLY STRL (GAUZE/BANDAGES/DRESSINGS) ×2 IMPLANT
GLOVE ECLIPSE 9.0 STRL (GLOVE) ×2 IMPLANT
GLOVE EXAM NITRILE LRG STRL (GLOVE) IMPLANT
GLOVE EXAM NITRILE XL STR (GLOVE) IMPLANT
GLOVE EXAM NITRILE XS STR PU (GLOVE) IMPLANT
GOWN STRL REUS W/ TWL LRG LVL3 (GOWN DISPOSABLE) IMPLANT
GOWN STRL REUS W/ TWL XL LVL3 (GOWN DISPOSABLE) ×1 IMPLANT
GOWN STRL REUS W/TWL 2XL LVL3 (GOWN DISPOSABLE) IMPLANT
GOWN STRL REUS W/TWL LRG LVL3 (GOWN DISPOSABLE)
GOWN STRL REUS W/TWL XL LVL3 (GOWN DISPOSABLE) ×2
KIT BASIN OR (CUSTOM PROCEDURE TRAY) ×2 IMPLANT
KIT TURNOVER KIT B (KITS) ×2 IMPLANT
NDL SPNL 22GX3.5 QUINCKE BK (NEEDLE) IMPLANT
NEEDLE HYPO 22GX1.5 SAFETY (NEEDLE) ×2 IMPLANT
NEEDLE SPNL 22GX3.5 QUINCKE BK (NEEDLE) IMPLANT
NS IRRIG 1000ML POUR BTL (IV SOLUTION) ×2 IMPLANT
OIL CARTRIDGE MAESTRO DRILL (MISCELLANEOUS) ×2
PACK LAMINECTOMY NEURO (CUSTOM PROCEDURE TRAY) ×2 IMPLANT
PAD ARMBOARD 7.5X6 YLW CONV (MISCELLANEOUS) ×6 IMPLANT
RUBBERBAND STERILE (MISCELLANEOUS) ×4 IMPLANT
SPONGE SURGIFOAM ABS GEL SZ50 (HEMOSTASIS) ×2 IMPLANT
STRIP CLOSURE SKIN 1/2X4 (GAUZE/BANDAGES/DRESSINGS) ×2 IMPLANT
SUT VIC AB 2-0 CT1 18 (SUTURE) ×2 IMPLANT
SUT VIC AB 3-0 SH 8-18 (SUTURE) ×2 IMPLANT
TOWEL GREEN STERILE (TOWEL DISPOSABLE) ×2 IMPLANT
TOWEL GREEN STERILE FF (TOWEL DISPOSABLE) ×2 IMPLANT
WATER STERILE IRR 1000ML POUR (IV SOLUTION) ×2 IMPLANT

## 2018-01-11 NOTE — Brief Op Note (Signed)
01/11/2018  11:18 AM  PATIENT:  Meryl Dare  77 y.o. male  PRE-OPERATIVE DIAGNOSIS:  Stenosis  POST-OPERATIVE DIAGNOSIS:  Stenosis  PROCEDURE:  Procedure(s) with comments: Laminectomy and Foraminotomy - Left - Lumbar Three-Lumbar Four - Lumbar Five-Sacral One (Left) - Laminectomy and Foraminotomy - Left - Lumbar Three-Lumbar Four - Lumbar Five-Sacral One  SURGEON:  Surgeon(s) and Role:    * Earnie Larsson, MD - Primary  PHYSICIAN ASSISTANT:   ASSISTANTSMearl Latin   ANESTHESIA:   general  EBL:  225 mL   BLOOD ADMINISTERED:none  DRAINS: none   LOCAL MEDICATIONS USED:  MARCAINE     SPECIMEN:  No Specimen  DISPOSITION OF SPECIMEN:  N/A  COUNTS:  YES  TOURNIQUET:  * No tourniquets in log *  DICTATION: .Dragon Dictation  PLAN OF CARE: Admit for overnight observation  PATIENT DISPOSITION:  PACU - hemodynamically stable.   Delay start of Pharmacological VTE agent (>24hrs) due to surgical blood loss or risk of bleeding: yes

## 2018-01-11 NOTE — Anesthesia Postprocedure Evaluation (Signed)
Anesthesia Post Note  Patient: [name]  Procedure(s) Performed: Laminectomy and Foraminotomy - Left - Lumbar Three-Lumbar Four - Lumbar Five-Sacral One (Left Back)     Patient location during evaluation: PACU Anesthesia Type: General Level of consciousness: awake and alert Pain management: pain level controlled Vital Signs Assessment: post-procedure vital signs reviewed and stable Respiratory status: spontaneous breathing, nonlabored ventilation, respiratory function stable and patient connected to nasal cannula oxygen Cardiovascular status: blood pressure returned to baseline and stable Postop Assessment: no apparent nausea or vomiting Anesthetic complications: no Comments: BP at baseline, will resume home meds on floor.     Last Vitals:  Vitals:   01/11/18 1359 01/11/18 1404  BP: (!) 188/99   Pulse: 69 70  Resp: 11 16  Temp:  36.5 C  SpO2: 96% 100%    Last Pain:  Vitals:   01/11/18 1345  TempSrc:   PainSc: [place] Bessye Stith

## 2018-01-11 NOTE — Anesthesia Procedure Notes (Signed)
Procedure Name: Intubation Date/Time: 01/11/2018 9:58 AM Performed by: Eligha Bridegroom, CRNA Pre-anesthesia Checklist: Patient identified, Emergency Drugs available, Suction available, Patient being monitored and Timeout performed Patient Re-evaluated:Patient Re-evaluated prior to induction Oxygen Delivery Method: Circle system utilized Preoxygenation: Pre-oxygenation with 100% oxygen Induction Type: IV induction Ventilation: Mask ventilation without difficulty and Oral airway inserted - appropriate to patient size Laryngoscope Size: Mac and 4 Grade View: Grade II Tube type: Oral Tube size: 7.5 mm Number of attempts: 1 Airway Equipment and Method: Stylet Placement Confirmation: ETT inserted through vocal cords under direct vision,  positive ETCO2 and breath sounds checked- equal and bilateral Secured at: 22 cm Tube secured with: Tape Dental Injury: Teeth and Oropharynx as per pre-operative assessment

## 2018-01-11 NOTE — Progress Notes (Signed)
Patient's blood pressure 195/100.  Dr. Smith Robert made aware

## 2018-01-11 NOTE — Progress Notes (Signed)
Paged Dr. Annette Stable and spoke to nurse in [place], made aware of patient being tx for UTI now  for 5 days with cefdinir 300 mg BID.  Patient states his symptoms are better.  Also made aware of BP 207/98.  Dr. Annette Stable stated was Okay.

## 2018-01-11 NOTE — Op Note (Signed)
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  Asst. Surgeon: Reinaldo Meeker, NP  Anesthesia: General  Indication: 77 year old male status post prior L4-5 decompression and fusion.  Patient presents now with left hip and radicular pain failing conservative management.  Work-up demonstrates evidence of significant spondylosis and stenosis at L3-4 and L5-S1 on the left side.  Patient is failed conservative management presents now for decompressive surgery in hopes of improving his symptoms.  Operative note: After induction of anesthesia, patient position prone on the Wilson frame and appropriately padded.  Lumbar region prepped and draped sterilely.  Incision made overlying L4-5.  Dissection performed on the left side.  L3-4 and L5-S1 levels were exposed.  Retractors placed.  X-rays taken.  Levels confirmed.  Laminotomy was then performed using high-speed drill and Kerrison Rogers to remove the inferior aspect of the lamina above the medial aspect of the facet joint and the superior aspect of the lamina below.  Ligament flavum elevated and resected.  Foraminotomies were then performed on the course exiting L3-L4-L5 and S1 nerve roots on the left side.  Disc spaces were examined.  No evidence of disc herniation.  No evidence of tumor mass.  At this point a very thorough decompression had been achieved.  There was no evidence of injury to the thecal sac or nerve roots.  Wound was then irrigated fanlike solution.  Gelfoam was placed topically for hemostasis.  Wounds and closed in layers with Vicryl sutures.  Steri-Strips and sterile dressing were applied.  No apparent  complications.  Patient tolerated the procedure well and he returns to the recovery room postop.

## 2018-01-11 NOTE — Transfer of Care (Signed)
Immediate Anesthesia Transfer of Care Note  Patient: [name]  Procedure(s) Performed: Laminectomy and Foraminotomy - Left - Lumbar Three-Lumbar Four - Lumbar Five-Sacral One (Left Back)  Patient Location: PACU  Anesthesia Type:General  Level of Consciousness: awake, alert  and oriented  Airway & Oxygen Therapy: Patient Spontanous Breathing and Patient connected to nasal cannula oxygen  Post-op Assessment: Report given to RN and Post -op Vital signs reviewed and stable  Post vital signs: Reviewed and stable  Last Vitals:  Vitals Value Taken Time  BP 161/89 01/11/2018 11:40 AM  Temp    Pulse 73 01/11/2018 11:40 AM  Resp 16 01/11/2018 11:40 AM  SpO2 97 % 01/11/2018 11:40 AM  Vitals shown include unvalidated device data.  Last Pain:  Vitals:   01/11/18 0809  TempSrc: Oral  PainSc: 0-No pain      Patients Stated Pain Goal: 3 (34/37/35 7897)  Complications: No apparent anesthesia complications

## 2018-01-11 NOTE — H&P (Signed)
[name] is an 77 y.o. male.   Chief Complaint: Left leg pain HPI: 77 year old male status post prior L4-5 decompression and fusion presents now with left lower extremity pain failing conservative management.  Work-up demonstrates evidence of progressive spondylosis and stenosis on the left at L3-4 and L5-S1.  Patient presents now for decompressive surgery.  Minimal right-sided symptoms.  No weakness.  Past Medical History:  Diagnosis Date  . Anxiety   . Arthritis   . BPH (benign prostatic hyperplasia)   . Bruises easily   . Cancer (HCC)    throat  . Complication of anesthesia    difficult urination after anesthesia  . Dizziness   . GERD (gastroesophageal reflux disease)   . Glaucoma    left worse than right  . H/O hiatal hernia   . Heart murmur   . Hyperlipemia   . Hypertension    takes meds daily  . Hypothyroidism   . Transient alteration of awareness    ~ 12/27/17, evaluated by neurologist Dr. Felecia Shelling 01/06/18  . Urinary retention     Past Surgical History:  Procedure Laterality Date  . ANTERIOR LAT LUMBAR FUSION  03/11/2012   Procedure: ANTERIOR LATERAL LUMBAR FUSION 1 LEVEL;  Surgeon: Charlie Pitter, MD;  Location: [facility];  Service: Neurosurgery;  Laterality: Left;  Left lumbar four-five extreme lumbar interbody fusion with percutaneous pedicle screws   . BACK SURGERY     ruptured disc repair  . COLONOSCOPY W/ POLYPECTOMY    . EYE SURGERY     left cataract  . HERNIA REPAIR    . INGUINAL HERNIA REPAIR Right 05/22/2014   Procedure: REPAIR RECURRENT RIGHT INGUINAL HERNIA;  Surgeon: Doreen Salvage, MD;  Location: [facility];  Service: General;  Laterality: Right;  . INSERTION OF MESH Right 05/22/2014   Procedure: INSERTION OF MESH;  Surgeon: Doreen Salvage, MD;  Location: [facility];  Service: General;  Laterality: Right;  . LUMBAR PERCUTANEOUS PEDICLE SCREW 1 LEVEL  03/11/2012   Procedure: LUMBAR PERCUTANEOUS PEDICLE SCREW 1 LEVEL;  Surgeon: Charlie Pitter, MD;  Location: [facility];   Service: Neurosurgery;  Laterality: Left;  Left lumbar four-five extreme lumbar interbody fusion with percutaneous pedicle screws   . RADICAL NECK DISSECTION  1989  . ROTATOR CUFF REPAIR     right  . TONSILLECTOMY      History reviewed. No pertinent family history. Social History:  reports that he has quit smoking. He has never used smokeless tobacco. He reports that he drinks alcohol. He reports that he does not use drugs.  Allergies:  Allergies  Allergen Reactions  . Codeine Nausea Only    Medications Prior to Admission  Medication Sig Dispense Refill  . aspirin 81 MG tablet Take 81 mg by mouth at bedtime.    . cefdinir (OMNICEF) 300 MG capsule Take 300 mg by mouth 2 (two) times daily.    . dorzolamide-timolol (COSOPT) 22.3-6.8 MG/ML ophthalmic solution Place 1 drop into both eyes 2 (two) times daily.    Marland Kitchen doxazosin (CARDURA) 8 MG tablet Take 1 tablet (8 mg total) by mouth daily. 90 tablet 1  . escitalopram (LEXAPRO) 20 MG tablet Take 1 tablet (20 mg total) by mouth daily. 90 tablet 3  . fluticasone (FLONASE) 50 MCG/ACT nasal spray Place 1 spray into both nostrils daily. (Patient taking differently: Place 1 spray into both nostrils daily as needed for allergies. ) 16 g 6  . furosemide (LASIX) 20 MG tablet Take 1 tablet (  20 mg total) by mouth daily. 90 tablet 1  . ipratropium (ATROVENT) 0.03 % nasal spray Place 1 spray into both nostrils 2 (two) times daily as needed for rhinitis. 30 mL 5  . labetalol (NORMODYNE) 100 MG tablet Take 100 mg by mouth 2 (two) times daily.    Marland Kitchen levothyroxine (SYNTHROID, LEVOTHROID) 50 MCG tablet Take 1 tablet (50 mcg total) by mouth daily. (Patient taking differently: Take 50 mcg by mouth at bedtime. ) 90 tablet 1  . losartan (COZAAR) 100 MG tablet Take 1 tablet (100 mg total) by mouth daily. 90 tablet 1  . omeprazole (PRILOSEC) 20 MG capsule Take 1 capsule (20 mg total) by mouth daily. (Patient taking differently: Take 20 mg by mouth every other day. IN  THE MORNING) 90 capsule 1  . simvastatin (ZOCOR) 20 MG tablet Take 1 tablet (20 mg total) by mouth every evening. 90 tablet 3  . Tafluprost 0.0015 % SOLN Place 1 drop into both eyes at bedtime. ZIOPTAN (tafluprost)      No results found for this or any previous visit (from the past 48 hour(s)). No results found.  A comprehensive review of systems was negative.  Blood pressure (!) 195/100, pulse 62, temperature 98.6 F (37 C), temperature source Oral, resp. rate 18, SpO2 96 %.  Patient is awake and alert.  He is oriented and appropriate.  Cranial nerve function is intact.  Motor examination extremities normal.  Sensory examination with some decreased sensation pinprick and light touch in his left L4 dermatome.  Deep tender mixes normal active.  No with long track signs.  Gait and posture are recently normal.  Examination head ears eyes and throat is unremarkable her chest and abdomen are benign. 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  Plan left-sided L3-4 and left-sided L5-S1 decompressive laminotomies and foraminotomies.  Risks and benefits of been explained.  Patient wishes to proceed.  [name] 01/11/2018, 9:33 AM

## 2018-01-11 NOTE — Anesthesia Preprocedure Evaluation (Addendum)
Anesthesia Evaluation  Patient identified by MRN, date of birth, ID band Patient awake    Reviewed: Allergy & Precautions, NPO status , Patient's Chart, lab work & pertinent test results  Airway Mallampati: I  TM Distance: >3 FB Neck ROM: Limited    Dental  (+) Teeth Intact, Dental Advisory Given   Pulmonary former smoker,    breath sounds clear to auscultation       Cardiovascular hypertension, Pt. on home beta blockers and Pt. on medications  Rhythm:Regular Rate:Normal     Neuro/Psych Anxiety negative neurological ROS     GI/Hepatic Neg liver ROS, hiatal hernia, GERD  Medicated,  Endo/Other  Hypothyroidism   Renal/GU negative Renal ROS     Musculoskeletal  (+) Arthritis ,   Abdominal Normal abdominal exam  (+)   Peds  Hematology   Anesthesia Other Findings - HLD  Reproductive/Obstetrics                            Anesthesia Physical Anesthesia Plan  ASA: II  Anesthesia Plan: General   Post-op Pain Management:    Induction: Intravenous  PONV Risk Score and Plan: 3 and Ondansetron, Dexamethasone and Treatment may vary due to age or medical condition  Airway Management Planned: Oral ETT  Additional Equipment: None  Intra-op Plan:   Post-operative Plan: Extubation in OR  Informed Consent: I have reviewed the patients History and Physical, chart, labs and discussed the procedure including the risks, benefits and alternatives for the proposed anesthesia with the patient or authorized representative who has indicated his/her understanding and acceptance.   Dental advisory given  Plan Discussed with: CRNA  Anesthesia Plan Comments:        Anesthesia Quick Evaluation

## 2018-01-12 ENCOUNTER — Encounter (HOSPITAL_COMMUNITY): Payer: Self-pay | Admitting: Neurosurgery

## 2018-01-12 DIAGNOSIS — M4726 Other spondylosis with radiculopathy, lumbar region: Secondary | ICD-10-CM | POA: Diagnosis not present

## 2018-01-12 MED ORDER — CYCLOBENZAPRINE HCL 10 MG PO TABS: 10.0000 mg | ORAL_TABLET | Freq: Three times a day (TID) | ORAL | 0 refills | Status: DC | PRN

## 2018-01-12 MED ORDER — HYDROCODONE-ACETAMINOPHEN 10-325 MG PO TABS: 1.0000 | ORAL_TABLET | ORAL | 0 refills | Status: DC | PRN

## 2018-01-12 NOTE — Progress Notes (Signed)
Patient alert and oriented, mae's well, voiding adequate amount of urine, swallowing without difficulty, no c/o pain at time of discharge. Patient discharged home with family. Script and discharged instructions given to patient. Patient and family stated understanding of instructions given. Patient has an appointment with Dr. Pool  

## 2018-01-12 NOTE — Addendum Note (Signed)
Addendum  created 01/12/18 1126 by Effie Berkshire, MD   Intraprocedure Staff edited

## 2018-01-12 NOTE — Progress Notes (Signed)
Occupational Therapy Evaluation and Discharge Patient Details Name: [name] MRN: [id number] DOB: [birth year] Today's Date: 01/12/2018    History of Present Illness 77 year old male status post prior L4-5 decompression and fusion presents now with left lower extremity pain. S/p left-sided L3-4 and L5-S1 decompressive surgery   Clinical Impression   PTA Pt independent in ADL and mobility. Pt is currently supervision in the hospital setting for ADL and mobility in the room. Back handout provided and reviewed adls in detail. Pt educated on: set an alarm at night for medication, avoid sitting for long periods of time, correct bed positioning for sleeping, correct sequence for bed mobility, avoiding lifting more than 5 pounds and never wash directly over incision. All education is complete and patient indicates understanding. Did discuss 3 in 1 and uses - Pt will benefit from this especially for use as a toilet riser as he has low toilets at home. Pt will have supervision/assist as needed from his wife 24 hours a day. Thank you for the opportunity to serve this patient.    Follow Up Recommendations  Supervision - Intermittent    Equipment Recommendations  3 in 1 bedside commode    Recommendations for Other Services       Precautions / Restrictions Precautions Precautions: Back Precaution Booklet Issued: Yes (comment) Precaution Comments: reviewed 3/3 precautions and handout in full Required Braces or Orthoses: (no brace per orders) Restrictions Weight Bearing Restrictions: No      Mobility Bed Mobility Overal bed mobility: Modified Independent             General bed mobility comments: good log roll technique, use of bed rail to assist  Transfers Overall transfer level: Modified independent Equipment used: None             General transfer comment: able to perform transfers from EOB, BSC (over toilet in bathroom) and from recliner with good hand placement     Balance Overall balance assessment: Mild deficits observed, not formally tested                                         ADL either performed or assessed with clinical judgement   ADL Overall ADL's : Needs assistance/impaired Eating/Feeding: Independent   Grooming: Supervision/safety;Standing;Cueing for compensatory techniques Grooming Details (indicate cue type and reason): educated in cup method and setting up items on the right to prevent twisting Upper Body Bathing: Minimal assistance   Lower Body Bathing: Supervison/ safety   Upper Body Dressing : Set up   Lower Body Dressing: Set up Lower Body Dressing Details (indicate cue type and reason): able to cross feet to knees to don socks/underwear Toilet Transfer: Copy Details (indicate cue type and reason): initially required support from furniture, able to progress to no UE support Toileting- Water quality scientist and Hygiene: Supervision/safety   Tub/ Banker: Supervision/safety   Functional mobility during ADLs: Supervision/safety General ADL Comments: Pt able to perform ADL with good safety awareness during ADL and functional mobility in room. Education provided for compensatory strategies     Vision Patient Visual Report: No change from baseline       Perception     Praxis      Pertinent Vitals/Pain Pain Assessment: 0-10 Pain Score: 4  Pain Location: incision site/back Pain Descriptors / Indicators: Discomfort;Sore Pain Intervention(s): Monitored during session;Repositioned     Hand Dominance Right  Extremity/Trunk Assessment Upper Extremity Assessment Upper Extremity Assessment: Overall WFL for tasks assessed   Lower Extremity Assessment Lower Extremity Assessment: Overall WFL for tasks assessed   Cervical / Trunk Assessment Cervical / Trunk Assessment: Other exceptions Cervical / Trunk Exceptions: s/p sx   Communication  Communication Communication: No difficulties   Cognition Arousal/Alertness: Awake/alert Behavior During Therapy: WFL for tasks assessed/performed Overall Cognitive Status: Within Functional Limits for tasks assessed                                     General Comments       Exercises     Shoulder Instructions      Home Living Family/patient expects to be discharged to:: Private residence Living Arrangements: Spouse/significant other Available Help at Discharge: Family;Available 24 hours/day Type of Home: House Home Access: Stairs to enter [employer] of Steps: 2 Entrance Stairs-Rails: None Home Layout: One level     Bathroom Shower/Tub: Walk-in shower;Door   [employer] Toilet: Standard     Home Equipment: Shower seat - built in;Hand held shower head;Adaptive equipment Adaptive Equipment: Reacher        Prior Functioning/Environment Level of Independence: Independent        Comments: likes working on cars        OT Problem List:        OT Treatment/Interventions:      OT Goals(Current goals can be found in the care plan section) Acute Rehab OT Goals Patient Stated Goal: to get back to physical activity OT Goal Formulation: With patient Time For Goal Achievement: 01/26/18 Potential to Achieve Goals: Good  OT Frequency:     Barriers to D/C:            Co-evaluation              AM-PAC PT "6 Clicks" Daily Activity     Outcome Measure Help from another person eating meals?: None Help from another person taking care of personal grooming?: None Help from another person toileting, which includes using toliet, bedpan, or urinal?: None Help from another person bathing (including washing, rinsing, drying)?: None Help from another person to put on and taking off regular upper body clothing?: None Help from another person to put on and taking off regular lower body clothing?: A Little 6 Click Score: 23   End of Session  Equipment Utilized During Treatment: Gait belt Nurse Communication: Mobility status  Activity Tolerance: Patient tolerated treatment well Patient left: in chair;with call bell/phone within reach  OT Visit Diagnosis: Pain;Other abnormalities of gait and mobility (R26.89) Pain - part of body: (back)                Time: 3662-9476 OT Time Calculation (min): 21 min Charges:  OT General Charges $OT Visit: 1 Visit OT Evaluation $OT Eval Low Complexity: [place] OTR/L Acute Rehabilitation Services Pager: [phone] Office: [place] 01/12/2018, 9:55 AM

## 2018-01-12 NOTE — Discharge Summary (Signed)
Physician Discharge Summary  Patient ID: [name] MRN: [id number] DOB/AGE: [birth year] 77 y.o.  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  Postoperatively doing very well.  Preoperative back and lower extremity pain much improved.  Standing and walking without difficulty.  Ready for discharge home.  Consults:   Significant Diagnostic Studies:   Treatments:   Discharge Exam: Blood pressure (!) 145/82, pulse 65, temperature 97.6 F (36.4 C), temperature source Oral, resp. rate 18, SpO2 94 %. Awake and alert.  Oriented and appropriate.  Cranial nerve function intact.  Motor and sensory function extremities normal.  Wound clean and dry.  Chest and abdomen benign.  Disposition: Discharge disposition: 01-Home or Self Care        Allergies as of 01/12/2018      Reactions   Codeine Nausea Only      Medication List    TAKE these medications   aspirin 81 MG tablet Take 81 mg by mouth at bedtime.   cefdinir 300 MG capsule Commonly known as:  OMNICEF Take 300 mg by mouth 2 (two) times daily.   cyclobenzaprine 10 MG tablet Commonly known as:  FLEXERIL Take 1 tablet (10 mg total) by mouth 3 (three) times daily as needed for muscle spasms.   dorzolamide-timolol 22.3-6.8 MG/ML ophthalmic solution Commonly known as:  COSOPT Place 1 drop into both eyes 2 (two) times daily.   doxazosin 8 MG tablet Commonly known as:  CARDURA Take 1 tablet (8 mg total) by mouth daily.   escitalopram 20 MG tablet Commonly known as:  LEXAPRO Take 1 tablet (20 mg total) by mouth daily.   fluticasone 50 MCG/ACT nasal spray Commonly known as:   FLONASE Place 1 spray into both nostrils daily. What changed:    when to take this  reasons to take this   furosemide 20 MG tablet Commonly known as:  LASIX Take 1 tablet (20 mg total) by mouth daily.   HYDROcodone-acetaminophen 10-325 MG tablet Commonly known as:  NORCO Take 1-2 tablets by mouth every 4 (four) hours as needed for severe pain ((score 7 to 10)).   ipratropium 0.03 % nasal spray Commonly known as:  ATROVENT Place 1 spray into both nostrils 2 (two) times daily as needed for rhinitis.   labetalol 100 MG tablet Commonly known as:  NORMODYNE Take 100 mg by mouth 2 (two) times daily.   levothyroxine 50 MCG tablet Commonly known as:  SYNTHROID, LEVOTHROID Take 1 tablet (50 mcg total) by mouth daily. What changed:  when to take this   losartan 100 MG tablet Commonly known as:  COZAAR Take 1 tablet (100 mg total) by mouth daily.   omeprazole 20 MG capsule Commonly known as:  PRILOSEC Take 1 capsule (20 mg total) by mouth daily. What changed:    when to take this  additional instructions   simvastatin 20 MG tablet Commonly known as:  ZOCOR Take 1 tablet (20 mg total) by mouth every evening.   Tafluprost 0.0015 % Soln Place 1 drop into both eyes at bedtime. ZIOPTAN (tafluprost)        Signed: Charlie Pitter 01/12/2018, 8:32 AM

## 2018-01-12 NOTE — Discharge Instructions (Signed)

## 2018-02-01 ENCOUNTER — Ambulatory Visit: Payer: Medicare HMO | Admitting: Neurology

## 2018-02-01 DIAGNOSIS — R2990 Unspecified symptoms and signs involving the nervous system: Secondary | ICD-10-CM

## 2018-02-01 DIAGNOSIS — R404 Transient alteration of awareness: Secondary | ICD-10-CM

## 2018-02-01 NOTE — Progress Notes (Signed)
   [facility]  EEG (ELECTROENCEPHALOGRAM) REPORT   STUDY DATE: 02/01/2018 PATIENT NAME: [name] DOB: 03-20-1941 MRN: [id number]  ORDERING CLINICIAN: Krislynn Gronau A. Felecia Shelling, MD, PhD  TECHNOLOGIST: Nonda Lou TECHNIQUE: Electroencephalogram was recorded utilizing standard 10-20 system of lead placement and reformatted into average and bipolar montages.  RECORDING TIME: 22:59 minutes CLINICAL INFORMATION: 77 yo woman with transient alteration of awareness  FINDINGS: A digital EEG was performed while the patient was awake and drowsy. While awake and most alert there was a 8.5 hz posterior dominant rhythm. Voltages and frequencies were symmetric.  There were no focal, lateralizing, epileptiform activity or seizures seen.  Photic stimulation had a normal driving response. Hyperventilation and recovery did not change the underlying rhythms. EKG channel showed NSR.  No sleep was recorded.  IMPRESSION: This was a normal study while the patient was awake and drowsy.   INTERPRETING PHYSICIAN:   Jerlean Peralta A. Felecia Shelling, MD, PhD, [facility] Certified in Neurology, Clinical Neurophysiology, Sleep Medicine, Pain Medicine and Neuroimaging  [facility] [address] [place], [place] [address] [phone]

## 2018-02-02 DIAGNOSIS — N401 Enlarged prostate with lower urinary tract symptoms: Secondary | ICD-10-CM | POA: Diagnosis not present

## 2018-02-02 DIAGNOSIS — R300 Dysuria: Secondary | ICD-10-CM | POA: Diagnosis not present

## 2018-02-07 ENCOUNTER — Telehealth: Payer: Self-pay | Admitting: *Deleted

## 2018-02-07 NOTE — Telephone Encounter (Signed)
Spoke with pt. and per RAS, advised that EEG done in our office was normal. He verbalized understanding of same/fim

## 2018-02-08 ENCOUNTER — Ambulatory Visit (INDEPENDENT_AMBULATORY_CARE_PROVIDER_SITE_OTHER): Payer: Medicare HMO

## 2018-02-08 DIAGNOSIS — Z23 Encounter for immunization: Secondary | ICD-10-CM

## 2018-02-10 DIAGNOSIS — Z85828 Personal history of other malignant neoplasm of skin: Secondary | ICD-10-CM | POA: Diagnosis not present

## 2018-02-10 DIAGNOSIS — L570 Actinic keratosis: Secondary | ICD-10-CM | POA: Diagnosis not present

## 2018-02-10 DIAGNOSIS — C4441 Basal cell carcinoma of skin of scalp and neck: Secondary | ICD-10-CM | POA: Diagnosis not present

## 2018-02-10 DIAGNOSIS — C4442 Squamous cell carcinoma of skin of scalp and neck: Secondary | ICD-10-CM | POA: Diagnosis not present

## 2018-02-10 DIAGNOSIS — D485 Neoplasm of uncertain behavior of skin: Secondary | ICD-10-CM | POA: Diagnosis not present

## 2018-02-15 ENCOUNTER — Telehealth: Payer: Self-pay | Admitting: Family Medicine

## 2018-02-15 NOTE — Telephone Encounter (Signed)
Called and spoke with pt. I was able to move his appt from 03-07-18 to 03-09-18.   I advised of time, building and late policy.

## 2018-03-07 ENCOUNTER — Ambulatory Visit: Payer: Medicare HMO | Admitting: Family Medicine

## 2018-03-09 ENCOUNTER — Encounter: Payer: Self-pay | Admitting: Family Medicine

## 2018-03-09 ENCOUNTER — Other Ambulatory Visit: Payer: Self-pay

## 2018-03-09 ENCOUNTER — Ambulatory Visit (INDEPENDENT_AMBULATORY_CARE_PROVIDER_SITE_OTHER): Payer: Medicare HMO | Admitting: Family Medicine

## 2018-03-09 VITALS — BP 128/73 | HR 63 | Temp 97.6°F | Resp 14 | Ht 69.0 in | Wt 180.8 lb

## 2018-03-09 DIAGNOSIS — F411 Generalized anxiety disorder: Secondary | ICD-10-CM

## 2018-03-09 DIAGNOSIS — R69 Illness, unspecified: Secondary | ICD-10-CM | POA: Diagnosis not present

## 2018-03-09 DIAGNOSIS — I10 Essential (primary) hypertension: Secondary | ICD-10-CM | POA: Diagnosis not present

## 2018-03-09 DIAGNOSIS — E785 Hyperlipidemia, unspecified: Secondary | ICD-10-CM

## 2018-03-09 DIAGNOSIS — E039 Hypothyroidism, unspecified: Secondary | ICD-10-CM | POA: Diagnosis not present

## 2018-03-09 DIAGNOSIS — K219 Gastro-esophageal reflux disease without esophagitis: Secondary | ICD-10-CM

## 2018-03-09 DIAGNOSIS — L853 Xerosis cutis: Secondary | ICD-10-CM

## 2018-03-09 MED ORDER — ESCITALOPRAM OXALATE 20 MG PO TABS: 20.0000 mg | ORAL_TABLET | Freq: Every day | ORAL | 3 refills | Status: DC

## 2018-03-09 MED ORDER — LOSARTAN POTASSIUM 100 MG PO TABS: 100.0000 mg | ORAL_TABLET | Freq: Every day | ORAL | 1 refills | Status: DC

## 2018-03-09 MED ORDER — FLUOCINONIDE 0.05 % EX CREA: 1.0000 "application " | TOPICAL_CREAM | Freq: Two times a day (BID) | CUTANEOUS | 0 refills | Status: DC

## 2018-03-09 MED ORDER — OMEPRAZOLE 20 MG PO CPDR: 20.0000 mg | DELAYED_RELEASE_CAPSULE | ORAL | 1 refills | Status: DC

## 2018-03-09 MED ORDER — LEVOTHYROXINE SODIUM 50 MCG PO TABS: 50.0000 ug | ORAL_TABLET | Freq: Every day | ORAL | 1 refills | Status: DC

## 2018-03-09 MED ORDER — DOXAZOSIN MESYLATE 8 MG PO TABS: 8.0000 mg | ORAL_TABLET | Freq: Every day | ORAL | 1 refills | Status: DC

## 2018-03-09 NOTE — Patient Instructions (Addendum)
  No med changes at this time.  I will check some lab work.  Try Aveeno or Eucerin lotion to dry skin along with the steroid cream if needed.  Follow-up if the symptoms are worsening.  Recheck in 6 months.  Thanks for coming in today.   If you have lab work done today you will be contacted with your lab results within the next 2 weeks.  If you have not heard from [place] then please contact us. The fastest way to get your results is to register for My Chart.   IF you received an x-ray today, you will receive an invoice from [facility]. Please contact [facility] at [phone] with questions or concerns regarding your invoice.   IF you received labwork today, you will receive an invoice from [facility]. Please contact [facility] at [phone] with questions or concerns regarding your invoice.   Our billing staff will not be able to assist you with questions regarding bills from these companies.  You will be contacted with the lab results as soon as they are available. The fastest way to get your results is to activate your My Chart account. Instructions are located on the last page of this paperwork. If you have not heard from [place] regarding the results in 2 weeks, please contact this office.

## 2018-03-09 NOTE — Progress Notes (Signed)
Subjective:  By signing my name below, I, Moises Blood, attest that this documentation has been prepared under the direction and in the presence of Merri Ray, MD. Electronically Signed: Moises Blood, [place]. 03/09/2018 , 9:47 AM .  Patient was seen in Room 10 .   Patient ID: [name], male    DOB: [birth year], 77 y.o.   MRN: [id number] Chief Complaint  Patient presents with  . Follow-up    doing well with the new medication. need a refill on omeprazole, and lexapro, fluccinonide cream   HPI [name] is a 77 y.o. male Here follow up. He is not fasting today, had a bowl of cereal.   Episode of fatigue and transient level of awareness See office visit in August; he was evaluated by neurology, Dr. Felecia Shelling; thought to be dehydration. He had a normal EEG on Oct 1st.   HTN His nephrologist is Dr. Neta Ehlers at [facility]. He was last seen on Sept 9th, tolerating Labetalol at that time. Home BP's ranging in 130s-160s/60-70. He was continued on Labetalol 100 mg bid, Losartan 100 mg qd, and Doxazosin 8 mg qd. He denies lightheadedness, dizziness, chest pain or shortness of breath. He's also on Lasix 20 mg qd.   BP Readings from Last 3 Encounters:  03/09/18 128/73  01/12/18 (!) 145/82  01/06/18 (!) 148/80   Lab Results  Component Value Date   CREATININE 1.11 01/05/2018    GERD He has used omeprazole 20 mg, requesting refill. He has tried coming off of omeprazole in the past, but with recurrence of symptoms. He's been doing well taking omeprazole every other day.   History of anxiety He is controlled on Lexapro 20 mg, needing refill. He's doing well on it.   Dry skin dermatitis - seasonal He requests refill of steroid cream, Lidex 0.05%. He was previously prescribed by dermatologist for dry skin patches over his legs and ankles. He's been using it as needed, mostly dry skin during the winters. His last tube was prescribed on 07/31/15; lasting  for over 2 years.   Hypothyroidism Lab Results  Component Value Date   TSH 6.410 (H) 09/06/2017   This was slightly elevated from 4.5 a few months prior, but normal free T4. He's continued on synthroid 50 mcg qd.   Hyperlipidemia Lab Results  Component Value Date   CHOL 140 09/06/2017   HDL 72 09/06/2017   LDLCALC 52 09/06/2017   TRIG 80 09/06/2017   CHOLHDL 1.9 09/06/2017   Lab Results  Component Value Date   ALT 15 09/06/2017   AST 16 09/06/2017   ALKPHOS 77 09/06/2017   BILITOT 0.8 09/06/2017   He takes Zocor 20 mg qd.    Patient Active Problem List   Diagnosis Date Noted  . Lumbar radiculopathy 01/11/2018  . Transient alteration of awareness 01/06/2018  . Abnormal head CT 01/06/2018  . Hypertension 09/18/2016  . Benign essential hypertension 11/18/2015  . Acquired hypothyroidism 08/30/2015  . 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 Hypercholesteremia 08/30/2015  . 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 Anxiety   . Arthritis   . BPH (benign prostatic hyperplasia)   . Bruises easily   . Cancer (HCC)    throat  . Complication of anesthesia    difficult urination after anesthesia  . Dizziness   . GERD (gastroesophageal reflux disease)   . Glaucoma    left worse than right  .  H/O hiatal hernia   . Heart murmur   . Hyperlipemia   . Hypertension    takes meds daily  . Hypothyroidism   . Transient alteration of awareness    ~ 12/27/17, evaluated by neurologist Dr. Felecia Shelling 01/06/18  . Urinary retention    Past Surgical History:  Procedure Laterality Date  . ANTERIOR LAT LUMBAR FUSION  03/11/2012   Procedure: ANTERIOR LATERAL LUMBAR FUSION 1 LEVEL;  Surgeon: Charlie Pitter, MD;  Location: [place] [facility];  Service: Neurosurgery;  Laterality: Left;  Left lumbar four-five extreme lumbar interbody fusion with percutaneous pedicle screws   . BACK SURGERY     ruptured disc repair  . COLONOSCOPY W/  POLYPECTOMY    . EYE SURGERY     left cataract  . HERNIA REPAIR    . INGUINAL HERNIA REPAIR Right 05/22/2014   Procedure: REPAIR RECURRENT RIGHT INGUINAL HERNIA;  Surgeon: Doreen Salvage, MD;  Location: [place];  Service: General;  Laterality: Right;  . INSERTION OF MESH Right 05/22/2014   Procedure: INSERTION OF MESH;  Surgeon: Doreen Salvage, MD;  Location: [place];  Service: General;  Laterality: Right;  . LUMBAR LAMINECTOMY/DECOMPRESSION MICRODISCECTOMY Left 01/11/2018   Procedure: Laminectomy and Foraminotomy - Left - Lumbar Three-Lumbar Four - Lumbar Five-Sacral One;  Surgeon: Earnie Larsson, MD;  Location: [place];  Service: Neurosurgery;  Laterality: Left;  Laminectomy and Foraminotomy - Left - Lumbar Three-Lumbar Four - Lumbar Five-Sacral One  . LUMBAR PERCUTANEOUS PEDICLE SCREW 1 LEVEL  03/11/2012   Procedure: LUMBAR PERCUTANEOUS PEDICLE SCREW 1 LEVEL;  Surgeon: Charlie Pitter, MD;  Location: [place] [facility];  Service: Neurosurgery;  Laterality: Left;  Left lumbar four-five extreme lumbar interbody fusion with percutaneous pedicle screws   . RADICAL NECK DISSECTION  1989  . ROTATOR CUFF REPAIR     right  . TONSILLECTOMY     Allergies  Allergen Reactions  . Codeine Nausea Only   Prior to Admission medications   Medication Sig Start Date End Date Taking? Authorizing Provider  aspirin 81 MG tablet Take 81 mg by mouth at bedtime.   Yes [provider]  dorzolamide-timolol (COSOPT) 22.3-6.8 MG/ML ophthalmic solution Place 1 drop into both eyes 2 (two) times daily.   Yes [provider]  doxazosin (CARDURA) 8 MG tablet Take 1 tablet (8 mg total) by mouth daily. 09/02/17  Yes [name], MD  escitalopram (LEXAPRO) 20 MG tablet Take 1 tablet (20 mg total) by mouth daily. 09/02/17  Yes [name], MD  fluticasone (FLONASE) 50 MCG/ACT nasal spray Place 1 spray into both nostrils daily. Patient taking differently: Place 1 spray into both nostrils daily as needed for allergies.  09/02/17   Yes [name], MD  furosemide (LASIX) 20 MG tablet Take 1 tablet (20 mg total) by mouth daily. 09/02/17  Yes [name], MD  ipratropium (ATROVENT) 0.03 % nasal spray Place 1 spray into both nostrils 2 (two) times daily as needed for rhinitis. 09/02/17  Yes [name], MD  labetalol (NORMODYNE) 100 MG tablet Take 100 mg by mouth 2 (two) times daily.   Yes [provider]  levothyroxine (SYNTHROID, LEVOTHROID) 50 MCG tablet Take 1 tablet (50 mcg total) by mouth daily. Patient taking differently: Take 50 mcg by mouth at bedtime.  09/02/17  Yes [name], MD  losartan (COZAAR) 100 MG tablet Take 1 tablet (100 mg total) by mouth daily. 09/02/17  Yes [name], MD  omeprazole [facility])  20 MG capsule Take 1 capsule (20 mg total) by mouth daily. Patient taking differently: Take 20 mg by mouth every other day. IN THE MORNING 09/02/17  Yes [name], MD  simvastatin (ZOCOR) 20 MG tablet Take 1 tablet (20 mg total) by mouth every evening. 09/02/17  Yes [name], MD  Tafluprost 0.0015 % SOLN Place 1 drop into both eyes at bedtime. ZIOPTAN (tafluprost) 09/15/16  Yes [provider]  cefdinir (OMNICEF) 300 MG capsule Take 300 mg by mouth 2 (two) times daily.    [provider]  cyclobenzaprine (FLEXERIL) 10 MG tablet Take 1 tablet (10 mg total) by mouth 3 (three) times daily as needed for muscle spasms. 01/12/18   Earnie Larsson, MD  HYDROcodone-acetaminophen (NORCO) 10-325 MG tablet Take 1-2 tablets by mouth every 4 (four) hours as needed for severe pain ((score 7 to 10)). 01/12/18   Earnie Larsson, MD   Social History   Socioeconomic History  . Marital status: Married    Spouse name: Not on file  . Number of children: Not on file  . Years of education: Not on file  . Highest education level: Not on file  Occupational History  . Not on file  Social Needs  . Financial resource strain: Not on file  . Food insecurity:    Worry: Not on file     Inability: Not on file  . Transportation needs:    Medical: Not on file    Non-medical: Not on file  Tobacco Use  . Smoking status: Former Research scientist (life sciences)  . Smokeless tobacco: Never Used  Substance and Sexual Activity  . Alcohol use: Yes    Comment: 1 or 2 beers a week  . Drug use: No  . Sexual activity: Not Currently  Lifestyle  . Physical activity:    Days per week: Not on file    Minutes per session: Not on file  . Stress: Not on file  Relationships  . Social connections:    Talks on phone: Not on file    Gets together: Not on file    Attends religious service: Not on file    Active member of club or organization: Not on file    Attends meetings of clubs or organizations: Not on file    Relationship status: Not on file  . Intimate partner violence:    Fear of current or ex partner: Not on file    Emotionally abused: Not on file    Physically abused: Not on file    Forced sexual activity: Not on file  Other Topics Concern  . Not on file  Social History Narrative  . Not on file   Review of Systems  Constitutional: Negative for fatigue and unexpected weight change.  Eyes: Negative for visual disturbance.  Respiratory: Negative for cough, chest tightness and shortness of breath.   Cardiovascular: Negative for chest pain, palpitations and leg swelling.  Gastrointestinal: Negative for abdominal pain and blood in stool.  Neurological: Negative for dizziness, light-headedness and headaches.       Objective:   Physical Exam  Constitutional: He is oriented to person, place, and time. He appears well-developed and well-nourished.  HENT:  Head: Normocephalic and atraumatic.  Eyes: Pupils are equal, round, and reactive to light. EOM are normal.  Neck: No JVD present. Carotid bruit is not present.  Cardiovascular: Normal rate, regular rhythm and normal heart sounds.  No murmur heard. Pulmonary/Chest: Effort normal and breath sounds normal. He has no rales.  Musculoskeletal: He  exhibits no edema.  Neurological: He is alert and oriented to person, place, and time.  Skin: Skin is warm and dry.  Few faint excoriated left ankle  Psychiatric: He has a normal mood and affect.  Vitals reviewed.   Vitals:   03/09/18 0901  BP: 128/73  Pulse: 63  Resp: 14  Temp: 97.6 F (36.4 C)  TempSrc: Oral  SpO2: 98%  Weight: 180 lb 12.8 oz (82 kg)  Height: 5\' 9"  (1.753 m)        Assessment & Plan:   [name] is a 77 y.o. male Essential hypertension - Plan: doxazosin (CARDURA) 8 MG tablet, losartan (COZAAR) 100 MG tablet  -  Stable, tolerating current regimen. Medications refilled.    Anxiety state - Plan: escitalopram (LEXAPRO) 20 MG tablet  - Stable, tolerating current regimen. Medications refilled. Labs pending as above.   Hypothyroidism, unspecified type - Plan: TSH + free T4, levothyroxine (SYNTHROID, LEVOTHROID) 50 MCG tablet  -Tolerating current dose of Synthroid.  Check labs.  Previous TSH borderline, hold on changes for now.  Gastroesophageal reflux disease, esophagitis presence not specified - Plan: omeprazole (PRILOSEC) 20 MG capsule  -Stable with omeprazole.  Continue same.  Dry skin dermatitis - Plan: fluocinonide cream (LIDEX) 0.05 %  -Lidex refilled, but recommended Aveeno or Eucerin lotion for dry areas.  Dry skin care discussed.  Hyperlipidemia, unspecified hyperlipidemia type - Plan: Hepatic Function Panel  -On Zocor.  Check hepatic function panel per  Meds ordered this encounter  Medications  . doxazosin (CARDURA) 8 MG tablet    Sig: Take 1 tablet (8 mg total) by mouth daily.    Dispense:  90 tablet    Refill:  1  . escitalopram (LEXAPRO) 20 MG tablet    Sig: Take 1 tablet (20 mg total) by mouth daily.    Dispense:  90 tablet    Refill:  3  . levothyroxine (SYNTHROID, LEVOTHROID) 50 MCG tablet    Sig: Take 1 tablet (50 mcg total) by mouth daily.    Dispense:  90 tablet    Refill:  1  . losartan (COZAAR) 100 MG tablet    Sig:  Take 1 tablet (100 mg total) by mouth daily.    Dispense:  90 tablet    Refill:  1  . omeprazole (PRILOSEC) 20 MG capsule    Sig: Take 1 capsule (20 mg total) by mouth every other day. IN THE MORNING    Dispense:  90 capsule    Refill:  1  . fluocinonide cream (LIDEX) 0.05 %    Sig: Apply 1 application topically 2 (two) times daily.    Dispense:  30 g    Refill:  0   Patient Instructions    No med changes at this time.  I will check some lab work.  Try Aveeno or Eucerin lotion to dry skin along with the steroid cream if needed.  Follow-up if the symptoms are worsening.  Recheck in 6 months.  Thanks for coming in today.   If you have lab work done today you will be contacted with your lab results within the next 2 weeks.  If you have not heard from [place] then please contact us. The fastest way to get your results is to register for My Chart.   IF you received an x-ray today, you will receive an invoice from [facility]. Please contact [facility] at [phone] with questions or concerns regarding your invoice.   IF  you received labwork today, you will receive an invoice from [place]. Please contact [facility] at [phone] with questions or concerns regarding your invoice.   Our billing staff will not be able to assist you with questions regarding bills from these companies.  You will be contacted with the lab results as soon as they are available. The fastest way to get your results is to activate your My Chart account. Instructions are located on the last page of this paperwork. If you have not heard from [place] regarding the results in 2 weeks, please contact this office.       I personally performed the services described in this documentation, which was scribed in my presence. The recorded information has been reviewed and considered for accuracy and completeness, addended by me as needed, and agree with information above.  Signed,   Merri Ray, MD [facility] [place].  03/13/18 8:39 AM

## 2018-03-10 LAB — HEPATIC FUNCTION PANEL
ALBUMIN: 4.1 g/dL (ref 3.5–4.8)
ALT: 12 IU/L (ref 0–44)
AST: 14 IU/L (ref 0–40)
Alkaline Phosphatase: 75 IU/L (ref 39–117)
BILIRUBIN TOTAL: 0.6 mg/dL (ref 0.0–1.2)
Bilirubin, Direct: 0.18 mg/dL (ref 0.00–0.40)
Total Protein: 6.3 g/dL (ref 6.0–8.5)

## 2018-03-10 LAB — TSH+FREE T4
FREE T4: 0.93 ng/dL (ref 0.82–1.77)
TSH: 5.17 u[IU]/mL — AB (ref 0.450–4.500)

## 2018-04-07 DIAGNOSIS — Z85828 Personal history of other malignant neoplasm of skin: Secondary | ICD-10-CM | POA: Diagnosis not present

## 2018-04-07 DIAGNOSIS — L821 Other seborrheic keratosis: Secondary | ICD-10-CM | POA: Diagnosis not present

## 2018-04-07 DIAGNOSIS — L720 Epidermal cyst: Secondary | ICD-10-CM | POA: Diagnosis not present

## 2018-04-07 DIAGNOSIS — L570 Actinic keratosis: Secondary | ICD-10-CM | POA: Diagnosis not present

## 2018-04-15 ENCOUNTER — Telehealth: Payer: Self-pay | Admitting: Family Medicine

## 2018-04-15 NOTE — Telephone Encounter (Signed)
MyChart message sent to patient about rescheduling their appt on 09/12/18

## 2018-04-29 DIAGNOSIS — R1319 Other dysphagia: Secondary | ICD-10-CM | POA: Diagnosis not present

## 2018-04-29 DIAGNOSIS — H60542 Acute eczematoid otitis externa, left ear: Secondary | ICD-10-CM | POA: Diagnosis not present

## 2018-05-06 DIAGNOSIS — I10 Essential (primary) hypertension: Secondary | ICD-10-CM | POA: Diagnosis not present

## 2018-05-19 DIAGNOSIS — R69 Illness, unspecified: Secondary | ICD-10-CM | POA: Diagnosis not present

## 2018-05-23 ENCOUNTER — Ambulatory Visit: Payer: Self-pay | Admitting: *Deleted

## 2018-05-23 NOTE — Telephone Encounter (Signed)
Pt reports BP trending up past 2 weeks. Max 05/17/2018  at 201/ 98. Has been ranging high 170s/ low 90s. Has not missed any BP meds. This am 140/88. Pt denies any other symptoms except "Ringing in ears when it gets high." None presently.  Denies any other symptoms. Pt states "I probably need my medications adjusted."  Per disposition, to be seen within 3 days. [place] called practice and spoke with Hassan Rowan,  unable to secure same day appt this Friday. Appt made with Dr. Carlota Raspberry Feb. 14th per next availability. Care advise given per protocol. Pt verbalizes understanding.  Reason for Disposition . Systolic BP  >= 962 OR Diastolic >= 952  Answer Assessment - Initial Assessment Questions 1. BLOOD PRESSURE: "What is the blood pressure?" "Did you take at least two measurements 5 minutes apart?"     140/88 2. ONSET: "When did you take your blood pressure?"     This AM 3. HOW: "How did you obtain the blood pressure?" (e.g., visiting nurse, automatic home BP monitor)     Home monitor, arm 4. HISTORY: "Do you have a history of high blood pressure?"     yes 5. MEDICATIONS: "Are you taking any medications for blood pressure?" "Have you missed any doses recently?"     Yes, no missed doses 6. OTHER SYMPTOMS: "Do you have any symptoms?" (e.g., headache, chest pain, blurred vision, difficulty breathing, weakness)     Ringing in ears when high. Not presently.  Protocols used: HIGH BLOOD PRESSURE-A-AH

## 2018-06-01 DIAGNOSIS — R69 Illness, unspecified: Secondary | ICD-10-CM | POA: Diagnosis not present

## 2018-06-02 DIAGNOSIS — R1310 Dysphagia, unspecified: Secondary | ICD-10-CM | POA: Diagnosis not present

## 2018-06-02 DIAGNOSIS — Z8589 Personal history of malignant neoplasm of other organs and systems: Secondary | ICD-10-CM | POA: Diagnosis not present

## 2018-06-02 DIAGNOSIS — K5900 Constipation, unspecified: Secondary | ICD-10-CM | POA: Diagnosis not present

## 2018-06-17 ENCOUNTER — Encounter: Payer: Self-pay | Admitting: Family Medicine

## 2018-06-17 ENCOUNTER — Other Ambulatory Visit: Payer: Self-pay

## 2018-06-17 ENCOUNTER — Ambulatory Visit (INDEPENDENT_AMBULATORY_CARE_PROVIDER_SITE_OTHER): Payer: Medicare HMO | Admitting: Family Medicine

## 2018-06-17 VITALS — BP 210/100 | HR 62 | Temp 97.9°F | Resp 16 | Ht 69.0 in | Wt 181.0 lb

## 2018-06-17 DIAGNOSIS — R0989 Other specified symptoms and signs involving the circulatory and respiratory systems: Secondary | ICD-10-CM

## 2018-06-17 DIAGNOSIS — F418 Other specified anxiety disorders: Secondary | ICD-10-CM

## 2018-06-17 DIAGNOSIS — R69 Illness, unspecified: Secondary | ICD-10-CM | POA: Diagnosis not present

## 2018-06-17 DIAGNOSIS — E039 Hypothyroidism, unspecified: Secondary | ICD-10-CM

## 2018-06-17 DIAGNOSIS — E785 Hyperlipidemia, unspecified: Secondary | ICD-10-CM | POA: Diagnosis not present

## 2018-06-17 MED ORDER — AMLODIPINE BESYLATE 2.5 MG PO TABS: 2.5000 mg | ORAL_TABLET | Freq: Every day | ORAL | 1 refills | Status: DC

## 2018-06-17 NOTE — Patient Instructions (Addendum)
Pressure looked okay earlier today, and blood pressure this afternoon might be related to food earlier today.  However with recent trend upwards of your blood pressures, can try a low-dose of amlodipine at 2.5 mg once per day.  Continue your other usual medications.  Please call your nephrologist office or stop by with these readings as I do want them involved with managing your blood pressure.  Watch for any lightheadedness or dizziness, or low blood pressure readings at the new dose of medication if that occurs return to your usual medicines only.    If any new or worsening headaches,  new dizziness,  new weakness, change in speech, change in vision or other  new symptoms with  elevated blood pressures be seen in emergency room or call 911.   No other medication changes for now, I will check some blood work.  Thank you for coming in today.   If you have lab work done today you will be contacted with your lab results within the next 2 weeks.  If you have not heard from [place] then please contact us. The fastest way to get your results is to register for My Chart.   IF you received an x-ray today, you will receive an invoice from [facility]. Please contact [facility] at [phone] with questions or concerns regarding your invoice.   IF you received labwork today, you will receive an invoice from [place]. Please contact [facility] at [phone] with questions or concerns regarding your invoice.   Our billing staff will not be able to assist you with questions regarding bills from these companies.  You will be contacted with the lab results as soon as they are available. The fastest way to get your results is to activate your My Chart account. Instructions are located on the last page of this paperwork. If you have not heard from [place] regarding the results in 2 weeks, please contact this office.

## 2018-06-17 NOTE — Progress Notes (Signed)
Subjective:    Patient ID: [name], male    DOB: 19-Jul-1940, 79 y.o.   MRN: [id number]  HPI [name] is a 78 y.o. male Presents today for: Chief Complaint  Patient presents with  . Hypertension    1 month  . Medication Refill    lexapro   Hypertension: BP Readings from Last 3 Encounters:  06/17/18 (!) 183/103  03/09/18 128/73  01/12/18 (!) 145/82   Lab Results  Component Value Date   CREATININE 1.11 01/05/2018  Last seen November 6.  Blood pressure controlled at that time at 120/73.  Has subsequently seen his nephrologist Dr. Neta Ehlers at [facility] on [address] on January 3.  Blood pressure 96/50 at that time.  Although he reported his blood pressure previously in the 120s over 60s to 70s was increased in the 180-200s over 80s to 90s over the previous week.  Mentation from the nephrologist was to continue the present regimen including doxazosin 8 mg daily, labetalol 100 mg twice daily, losartan 100 mg daily.  Low-salt diet, exercise, increase water intake, and monitor home BPs.    Has not called nephrologist about recent changes.   Home reading 126/68 this morning, 145/88 yesterday, but 160's/80's last week. Past month range of 126/68-201/98. No missed doses. No pill minder. Doxazosin in the morning, labetalol twice per day, lasix in am, losartan - morning?Marland Kitchen  No chest pains/HA/dizziness. Occasional pressure in head when BP up. No slurred speech/focal weakness.  Urinating ok.  Denies diet change - watching salt in diet, but did have chicken pot pie at lunch that was salty.  No current new pain.  Wondered about using amlodipine.   Depression/anxiety: Overall doing ok.  Blood pressure variations making him more stressed.   Depression screen [facility] 2/9 06/17/2018 03/09/2018 12/31/2017 12/28/2017 09/02/2017  Decreased Interest 0 0 0 0 0  Down, Depressed, Hopeless 0 0 0 0 0  PHQ - 2 Score 0 0 0 0 0   Hyperlipidemia:  Lab Results  Component Value Date     CHOL 140 09/06/2017   HDL 72 09/06/2017   LDLCALC 52 09/06/2017   TRIG 80 09/06/2017   CHOLHDL 1.9 09/06/2017   Lab Results  Component Value Date   ALT 12 03/09/2018   AST 14 03/09/2018   ALKPHOS 75 03/09/2018   BILITOT 0.6 03/09/2018  on zoloft 20mg  qd. No new side effects.   Hypothyroidism: Lab Results  Component Value Date   TSH 5.170 (H) 03/09/2018  synthroid 65mcg qd. No missed doses. No change with borderline reading in November.   Patient Active Problem List   Diagnosis Date Noted  . Lumbar radiculopathy 01/11/2018  . Transient alteration of awareness 01/06/2018  . Abnormal head CT 01/06/2018  . Hypertension 09/18/2016  . Benign essential hypertension 11/18/2015  . Acquired hypothyroidism 08/30/2015  . 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 Hypercholesteremia 08/30/2015  . 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 Anxiety   . Arthritis   . BPH (benign prostatic hyperplasia)   . Bruises easily   . Cancer (HCC)    throat  . Complication of anesthesia    difficult urination after anesthesia  . Dizziness   . GERD (gastroesophageal reflux disease)   . Glaucoma    left worse than right  . H/O hiatal hernia   . Heart murmur   . Hyperlipemia   . Hypertension    takes meds daily  .  Hypothyroidism   . Transient alteration of awareness    ~ 12/27/17, evaluated by neurologist Dr. Felecia Shelling 01/06/18  . Urinary retention    Past Surgical History:  Procedure Laterality Date  . ANTERIOR LAT LUMBAR FUSION  03/11/2012   Procedure: ANTERIOR LATERAL LUMBAR FUSION 1 LEVEL;  Surgeon: Charlie Pitter, MD;  Location: [place] [facility];  Service: Neurosurgery;  Laterality: Left;  Left lumbar four-five extreme lumbar interbody fusion with percutaneous pedicle screws   . BACK SURGERY     ruptured disc repair  . COLONOSCOPY W/ POLYPECTOMY    . EYE SURGERY     left cataract  . HERNIA REPAIR    . INGUINAL HERNIA REPAIR Right  05/22/2014   Procedure: REPAIR RECURRENT RIGHT INGUINAL HERNIA;  Surgeon: Doreen Salvage, MD;  Location: [place];  Service: General;  Laterality: Right;  . INSERTION OF MESH Right 05/22/2014   Procedure: INSERTION OF MESH;  Surgeon: Doreen Salvage, MD;  Location: [place];  Service: General;  Laterality: Right;  . LUMBAR LAMINECTOMY/DECOMPRESSION MICRODISCECTOMY Left 01/11/2018   Procedure: Laminectomy and Foraminotomy - Left - Lumbar Three-Lumbar Four - Lumbar Five-Sacral One;  Surgeon: Earnie Larsson, MD;  Location: [place];  Service: Neurosurgery;  Laterality: Left;  Laminectomy and Foraminotomy - Left - Lumbar Three-Lumbar Four - Lumbar Five-Sacral One  . LUMBAR PERCUTANEOUS PEDICLE SCREW 1 LEVEL  03/11/2012   Procedure: LUMBAR PERCUTANEOUS PEDICLE SCREW 1 LEVEL;  Surgeon: Charlie Pitter, MD;  Location: [place] [facility];  Service: Neurosurgery;  Laterality: Left;  Left lumbar four-five extreme lumbar interbody fusion with percutaneous pedicle screws   . RADICAL NECK DISSECTION  1989  . ROTATOR CUFF REPAIR     right  . TONSILLECTOMY     Allergies  Allergen Reactions  . Codeine Nausea Only   Prior to Admission medications   Medication Sig Start Date End Date Taking? Authorizing Provider  aspirin 81 MG tablet Take 81 mg by mouth at bedtime.   Yes [provider]  dorzolamide-timolol (COSOPT) 22.3-6.8 MG/ML ophthalmic solution Place 1 drop into both eyes 2 (two) times daily.   Yes [provider]  doxazosin (CARDURA) 8 MG tablet Take 1 tablet (8 mg total) by mouth daily. 03/09/18  Yes [name], MD  escitalopram (LEXAPRO) 20 MG tablet Take 1 tablet (20 mg total) by mouth daily. 03/09/18  Yes [name], MD  fluocinonide cream (LIDEX) 1.30 % Apply 1 application topically 2 (two) times daily. 03/09/18  Yes [name], MD  fluticasone (FLONASE) 50 MCG/ACT nasal spray Place 1 spray into both nostrils daily. Patient taking differently: Place 1 spray into both nostrils daily as needed for  allergies.  09/02/17  Yes [name], MD  furosemide (LASIX) 20 MG tablet Take 1 tablet (20 mg total) by mouth daily. 09/02/17  Yes [name], MD  ipratropium (ATROVENT) 0.03 % nasal spray Place 1 spray into both nostrils 2 (two) times daily as needed for rhinitis. 09/02/17  Yes [name], MD  labetalol (NORMODYNE) 100 MG tablet Take 100 mg by mouth 2 (two) times daily.   Yes [provider]  levothyroxine (SYNTHROID, LEVOTHROID) 50 MCG tablet Take 1 tablet (50 mcg total) by mouth daily. 03/09/18  Yes [name], MD  losartan (COZAAR) 100 MG tablet Take 1 tablet (100 mg total) by mouth daily. 03/09/18  Yes [name], MD  omeprazole (PRILOSEC) 20 MG capsule Take 1 capsule (20 mg total) by mouth every other day.  IN THE MORNING 03/09/18  Yes [name], MD  simvastatin (ZOCOR) 20 MG tablet Take 1 tablet (20 mg total) by mouth every evening. 09/02/17  Yes [name], MD  Tafluprost 0.0015 % SOLN Place 1 drop into both eyes at bedtime. ZIOPTAN (tafluprost) 09/15/16  Yes [provider]   Social History   Socioeconomic History  . Marital status: Married    Spouse name: Not on file  . Number of children: Not on file  . Years of education: Not on file  . Highest education level: Not on file  Occupational History  . Not on file  Social Needs  . Financial resource strain: Not on file  . Food insecurity:    Worry: Not on file    Inability: Not on file  . Transportation needs:    Medical: Not on file    Non-medical: Not on file  Tobacco Use  . Smoking status: Former Research scientist (life sciences)  . Smokeless tobacco: Never Used  Substance and Sexual Activity  . Alcohol use: Yes    Comment: 1 or 2 beers a week  . Drug use: No  . Sexual activity: Not Currently  Lifestyle  . Physical activity:    Days per week: Not on file    Minutes per session: Not on file  . Stress: Not on file  Relationships  . Social connections:    Talks on phone: Not on file     Gets together: Not on file    Attends religious service: Not on file    Active member of club or organization: Not on file    Attends meetings of clubs or organizations: Not on file    Relationship status: Not on file  . Intimate partner violence:    Fear of current or ex partner: Not on file    Emotionally abused: Not on file    Physically abused: Not on file    Forced sexual activity: Not on file  Other Topics Concern  . Not on file  Social History Narrative  . Not on file     Review of Systems  Constitutional: Negative for fatigue.  Eyes: Negative for visual disturbance.  Respiratory: Negative for cough, chest tightness and shortness of breath.   Cardiovascular: Negative for chest pain, palpitations and leg swelling.  Gastrointestinal: Negative for abdominal pain and blood in stool.  Neurological: Negative for facial asymmetry, speech difficulty, weakness, light-headedness and headaches.      Objective:   Physical Exam Vitals signs reviewed.  Constitutional:      Appearance: He is well-developed.  HENT:     Head: Normocephalic and atraumatic.  Eyes:     Pupils: Pupils are equal, round, and reactive to light.  Neck:     Vascular: No carotid bruit or JVD.  Cardiovascular:     Rate and Rhythm: Normal rate and regular rhythm.     Heart sounds: Normal heart sounds. No murmur.  Pulmonary:     Effort: Pulmonary effort is normal.     Breath sounds: Normal breath sounds. No rales.  Skin:    General: Skin is warm and dry.  Neurological:     General: No focal deficit present.     Mental Status: He is alert and oriented to person, place, and time.     Motor: No weakness.     Gait: Gait normal.  Psychiatric:        Mood and Affect: Mood normal.        Behavior: Behavior  normal.    Vitals:   06/17/18 1632 06/17/18 1633 06/17/18 1726  BP: (!) 192/92 (!) 183/103 (!) 210/100  Pulse: 62    Resp: 16    Temp: 97.9 F (36.6 C)    TempSrc: Oral    SpO2: 97%    Weight: 181  lb (82.1 kg)    Height: 5\' 9"  (1.753 m)         Assessment & Plan:   [name] is a 78 y.o. male Labile hypertension - Plan: Comprehensive metabolic panel, amLODipine (NORVASC) 2.5 MG tablet  -Significant variability on home readings, but overall has been trending upward.  Controlled earlier in the day, but again most readings have been higher and higher in the office.  Asymptomatic at present, nonfocal neuro exam.  -Decided to add low-dose amlodipine 2.5 mg to current regimen for now but advised close follow-up with his nephrologist with phone call Monday morning.  ER precautions if any new symptoms  Hypothyroidism, unspecified type - Plan: TSH + free T4  -Borderline prior, repeat testing.  Depression with anxiety  -Reports stable symptoms, no change in dose of Lexapro.  Hyperlipidemia, unspecified hyperlipidemia type - Plan: Lipid panel  -Tolerating simvastatin.  No changes, check labs.  Meds ordered this encounter  Medications  . amLODipine (NORVASC) 2.5 MG tablet    Sig: Take 1 tablet (2.5 mg total) by mouth daily.    Dispense:  30 tablet    Refill:  1   Patient Instructions   Pressure looked okay earlier today, and blood pressure this afternoon might be related to food earlier today.  However with recent trend upwards of your blood pressures, can try a low-dose of amlodipine at 2.5 mg once per day.  Continue your other usual medications.  Please call your nephrologist office or stop by with these readings as I do want them involved with managing your blood pressure.  Watch for any lightheadedness or dizziness, or low blood pressure readings at the new dose of medication if that occurs return to your usual medicines only.    If any new or worsening headaches,  new dizziness,  new weakness, change in speech, change in vision or other  new symptoms with  elevated blood pressures be seen in emergency room or call 911.   No other medication changes for now, I will check some  blood work.  Thank you for coming in today.   If you have lab work done today you will be contacted with your lab results within the next 2 weeks.  If you have not heard from [place] then please contact us. The fastest way to get your results is to register for My Chart.   IF you received an x-ray today, you will receive an invoice from [facility]. Please contact [facility] at [phone] with questions or concerns regarding your invoice.   IF you received labwork today, you will receive an invoice from [place]. Please contact [facility] at [phone] with questions or concerns regarding your invoice.   Our billing staff will not be able to assist you with questions regarding bills from these companies.  You will be contacted with the lab results as soon as they are available. The fastest way to get your results is to activate your My Chart account. Instructions are located on the last page of this paperwork. If you have not heard from [place] regarding the results in 2 weeks, please contact this office.       Signed,  Merri Ray, MD [facility] [place].  06/18/18 1:30 PM

## 2018-06-18 LAB — COMPREHENSIVE METABOLIC PANEL
ALBUMIN: 4.6 g/dL (ref 3.7–4.7)
ALT: 13 IU/L (ref 0–44)
AST: 14 IU/L (ref 0–40)
Albumin/Globulin Ratio: 2.3 — ABNORMAL HIGH (ref 1.2–2.2)
Alkaline Phosphatase: 80 IU/L (ref 39–117)
BILIRUBIN TOTAL: 0.6 mg/dL (ref 0.0–1.2)
BUN / CREAT RATIO: 13 (ref 10–24)
BUN: 15 mg/dL (ref 8–27)
CO2: 25 mmol/L (ref 20–29)
CREATININE: 1.17 mg/dL (ref 0.76–1.27)
Calcium: 9.4 mg/dL (ref 8.6–10.2)
Chloride: 102 mmol/L (ref 96–106)
GFR, EST AFRICAN AMERICAN: 69 mL/min/{1.73_m2} (ref >59)
GFR, EST NON AFRICAN AMERICAN: 60 mL/min/{1.73_m2} (ref >59)
GLOBULIN, TOTAL: 2 g/dL (ref 1.5–4.5)
GLUCOSE: 93 mg/dL (ref 65–99)
Potassium: 3.8 mmol/L (ref 3.5–5.2)
Sodium: 143 mmol/L (ref 134–144)
TOTAL PROTEIN: 6.6 g/dL (ref 6.0–8.5)

## 2018-06-18 LAB — LIPID PANEL
CHOL/HDL RATIO: 1.9 ratio (ref 0.0–5.0)
Cholesterol, Total: 134 mg/dL (ref 100–199)
HDL: 71 mg/dL (ref >39)
LDL CALC: 48 mg/dL (ref 0–99)
TRIGLYCERIDES: 75 mg/dL (ref 0–149)
VLDL Cholesterol Cal: 15 mg/dL (ref 5–40)

## 2018-06-18 LAB — TSH+FREE T4
Free T4: 0.81 ng/dL — ABNORMAL LOW (ref 0.82–1.77)
TSH: 6.95 u[IU]/mL — AB (ref 0.450–4.500)

## 2018-06-22 DIAGNOSIS — K449 Diaphragmatic hernia without obstruction or gangrene: Secondary | ICD-10-CM | POA: Diagnosis not present

## 2018-06-22 DIAGNOSIS — K222 Esophageal obstruction: Secondary | ICD-10-CM | POA: Diagnosis not present

## 2018-06-22 DIAGNOSIS — R1310 Dysphagia, unspecified: Secondary | ICD-10-CM | POA: Diagnosis not present

## 2018-07-11 DIAGNOSIS — I10 Essential (primary) hypertension: Secondary | ICD-10-CM | POA: Diagnosis not present

## 2018-07-12 DIAGNOSIS — R69 Illness, unspecified: Secondary | ICD-10-CM | POA: Diagnosis not present

## 2018-08-11 ENCOUNTER — Other Ambulatory Visit: Payer: Self-pay | Admitting: Family Medicine

## 2018-08-11 DIAGNOSIS — R69 Illness, unspecified: Secondary | ICD-10-CM | POA: Diagnosis not present

## 2018-08-11 DIAGNOSIS — R0989 Other specified symptoms and signs involving the circulatory and respiratory systems: Secondary | ICD-10-CM

## 2018-08-23 ENCOUNTER — Other Ambulatory Visit: Payer: Self-pay | Admitting: Family Medicine

## 2018-08-23 DIAGNOSIS — I10 Essential (primary) hypertension: Secondary | ICD-10-CM

## 2018-08-23 NOTE — Telephone Encounter (Signed)
Requested Prescriptions  Pending Prescriptions Disp Refills  . furosemide (LASIX) 20 MG tablet [Pharmacy Med Name: FUROSEMIDE 20 MG TABLET] 90 tablet 0    Sig: TAKE ONE TABLET BY MOUTH DAILY     Cardiovascular:  Diuretics - Loop Failed - 08/23/2018  9:05 AM      Failed - Last BP in normal range    BP Readings from Last 1 Encounters:  06/17/18 (!) 210/100         Passed - K in normal range and within 360 days    Potassium  Date Value Ref Range Status  06/17/2018 3.8 3.5 - 5.2 mmol/L Final         Passed - Ca in normal range and within 360 days    Calcium  Date Value Ref Range Status  06/17/2018 9.4 8.6 - 10.2 mg/dL Final         Passed - Na in normal range and within 360 days    Sodium  Date Value Ref Range Status  06/17/2018 143 134 - 144 mmol/L Final         Passed - Cr in normal range and within 360 days    Creatinine, Ser  Date Value Ref Range Status  06/17/2018 1.17 0.76 - 1.27 mg/dL Final         Passed - Valid encounter within last 6 months    Recent Outpatient Visits          2 months ago Labile hypertension   [facility], Ranell Patrick, MD   5 months ago Essential hypertension   [facility], Ranell Patrick, MD   7 months ago Fatigue, unspecified type   [facility], Ranell Patrick, MD   7 months ago Fatigue, unspecified type   [facility] [place], MD   11 months ago Hyperlipidemia, unspecified hyperlipidemia type   [facility], Ranell Patrick, MD      Future Appointments            In 3 weeks Carlota Raspberry Ranell Patrick, MD [facility] [place], [facility]

## 2018-09-12 ENCOUNTER — Ambulatory Visit: Payer: Medicare HMO | Admitting: Family Medicine

## 2018-09-14 ENCOUNTER — Ambulatory Visit: Payer: Medicare HMO | Admitting: Family Medicine

## 2018-09-14 ENCOUNTER — Other Ambulatory Visit: Payer: Self-pay | Admitting: Family Medicine

## 2018-09-14 DIAGNOSIS — R0989 Other specified symptoms and signs involving the circulatory and respiratory systems: Secondary | ICD-10-CM

## 2018-09-14 NOTE — Telephone Encounter (Signed)
Refill request for amlodipine 2.5 mg tab Per chart, from (02-14 visit), does patient needs to have visit with nephrologist regarding this medication before refill? Dr Carlota Raspberry Cassell Clement  06/17/2018 NOV 09/15/2018

## 2018-09-15 ENCOUNTER — Ambulatory Visit (INDEPENDENT_AMBULATORY_CARE_PROVIDER_SITE_OTHER): Payer: Medicare HMO | Admitting: Family Medicine

## 2018-09-15 ENCOUNTER — Other Ambulatory Visit: Payer: Self-pay

## 2018-09-15 VITALS — BP 125/64 | Ht 69.0 in | Wt 181.0 lb

## 2018-09-15 DIAGNOSIS — Z0000 Encounter for general adult medical examination without abnormal findings: Secondary | ICD-10-CM | POA: Diagnosis not present

## 2018-09-15 NOTE — Patient Instructions (Addendum)
Thank you for taking time to come for your Medicare Wellness Visit. I appreciate your ongoing commitment to your health goals. Please review the following plan we discussed and let me know if I can assist you in the future.    LPN  Healthy Eating Following a healthy eating pattern may help you to achieve and maintain a healthy body weight, reduce the risk of chronic disease, and live a long and productive life. It is important to follow a healthy eating pattern at an appropriate calorie level for your body. Your nutritional needs should be met primarily through food by choosing a variety of nutrient-rich foods. What are tips for following this plan? Reading food labels  Read labels and choose the following: ? Reduced or low sodium. ? Juices with 100% fruit juice. ? Foods with low saturated fats and high polyunsaturated and monounsaturated fats. ? Foods with whole grains, such as whole wheat, cracked wheat, brown rice, and wild rice. ? Whole grains that are fortified with folic acid. This is recommended for women who are pregnant or who want to become pregnant.  Read labels and avoid the following: ? Foods with a lot of added sugars. These include foods that contain brown sugar, corn sweetener, corn syrup, dextrose, fructose, glucose, high-fructose corn syrup, honey, invert sugar, lactose, malt syrup, maltose, molasses, raw sugar, sucrose, trehalose, or turbinado sugar.  Do not eat more than the following amounts of added sugar per day:  6 teaspoons (25 g) for women.  9 teaspoons (38 g) for men. ? Foods that contain processed or refined starches and grains. ? Refined grain products, such as white flour, degermed cornmeal, white bread, and white rice. Shopping  Choose nutrient-rich snacks, such as vegetables, whole fruits, and nuts. Avoid high-calorie and high-sugar snacks, such as potato chips, fruit snacks, and candy.  Use oil-based dressings and spreads on foods instead of  solid fats such as butter, stick margarine, or cream cheese.  Limit pre-made sauces, mixes, and "instant" products such as flavored rice, instant noodles, and ready-made pasta.  Try more plant-protein sources, such as tofu, tempeh, black beans, edamame, lentils, nuts, and seeds.  Explore eating plans such as the Mediterranean diet or vegetarian diet. Cooking  Use oil to saut or stir-fry foods instead of solid fats such as butter, stick margarine, or lard.  Try baking, boiling, grilling, or broiling instead of frying.  Remove the fatty part of meats before cooking.  Steam vegetables in water or broth. Meal planning   At meals, imagine dividing your plate into fourths: ? One-half of your plate is fruits and vegetables. ? One-fourth of your plate is whole grains. ? One-fourth of your plate is protein, especially lean meats, poultry, eggs, tofu, beans, or nuts.  Include low-fat dairy as part of your daily diet. Lifestyle  Choose healthy options in all settings, including home, work, school, restaurants, or stores.  Prepare your food safely: ? Wash your hands after handling raw meats. ? Keep food preparation surfaces clean by regularly washing with hot, soapy water. ? Keep raw meats separate from ready-to-eat foods, such as fruits and vegetables. ? Cook seafood, meat, poultry, and eggs to the recommended internal temperature. ? Store foods at safe temperatures. In general:  Keep cold foods at 40F (4.4C) or below.  Keep hot foods at 140F (60C) or above.  Keep your freezer at 0F (-17.8C) or below.  Foods are no longer safe to eat when they have been between the temperatures of 40-140F (4.4-60C) for   more than 2 hours. What foods should I eat? Fruits Aim to eat 2 cup-equivalents of fresh, canned (in natural juice), or frozen fruits each day. Examples of 1 cup-equivalent of fruit include 1 small apple, 8 large strawberries, 1 cup canned fruit,  cup dried fruit, or 1 cup  100% juice. Vegetables Aim to eat 2-3 cup-equivalents of fresh and frozen vegetables each day, including different varieties and colors. Examples of 1 cup-equivalent of vegetables include 2 medium carrots, 2 cups raw, leafy greens, 1 cup chopped vegetable (raw or cooked), or 1 medium baked potato. Grains Aim to eat 6 ounce-equivalents of whole grains each day. Examples of 1 ounce-equivalent of grains include 1 slice of bread, 1 cup ready-to-eat cereal, 3 cups popcorn, or  cup cooked rice, pasta, or cereal. Meats and other proteins Aim to eat 5-6 ounce-equivalents of protein each day. Examples of 1 ounce-equivalent of protein include 1 egg, 1/2 cup nuts or seeds, or 1 tablespoon (16 g) peanut butter. A cut of meat or fish that is the size of a deck of cards is about 3-4 ounce-equivalents.  Of the protein you eat each week, try to have at least 8 ounces come from seafood. This includes salmon, trout, herring, and anchovies. Dairy Aim to eat 3 cup-equivalents of fat-free or low-fat dairy each day. Examples of 1 cup-equivalent of dairy include 1 cup (240 mL) milk, 8 ounces (250 g) yogurt, 1 ounces (44 g) natural cheese, or 1 cup (240 mL) fortified soy milk. Fats and oils  Aim for about 5 teaspoons (21 g) per day. Choose monounsaturated fats, such as canola and olive oils, avocados, peanut butter, and most nuts, or polyunsaturated fats, such as sunflower, corn, and soybean oils, walnuts, pine nuts, sesame seeds, sunflower seeds, and flaxseed. Beverages  Aim for six 8-oz glasses of water per day. Limit coffee to three to five 8-oz cups per day.  Limit caffeinated beverages that have added calories, such as soda and energy drinks.  Limit alcohol intake to no more than 1 drink a day for nonpregnant women and 2 drinks a day for men. One drink equals 12 oz of beer (355 mL), 5 oz of wine (148 mL), or 1 oz of hard liquor (44 mL). Seasoning and other foods  Avoid adding excess amounts of salt to your  foods. Try flavoring foods with herbs and spices instead of salt.  Avoid adding sugar to foods.  Try using oil-based dressings, sauces, and spreads instead of solid fats. This information is based on general [place] nutrition guidelines. For more information, visit choosemyplate.gov. Exact amounts may vary based on your nutrition needs. Summary  A healthy eating plan may help you to maintain a healthy weight, reduce the risk of chronic diseases, and stay active throughout your life.  Plan your meals. Make sure you eat the right portions of a variety of nutrient-rich foods.  Try baking, boiling, grilling, or broiling instead of frying.  Choose healthy options in all settings, including home, work, school, restaurants, or stores. This information is not intended to replace advice given to you by your health care provider. Make sure you discuss any questions you have with your health care provider. Document Released: 08/02/2017 Document Revised: 08/02/2017 Document Reviewed: 08/02/2017 Elsevier Interactive Patient Education  2019 [employer]  

## 2018-09-15 NOTE — Progress Notes (Signed)
Presents today for [employer] Visit   Date of last exam: 06/17/2018  Interpreter used for this visit? No  Two authenticators used telemed visit  Patient Care Team: Derek Agreste, MD as PCP - General (Family Medicine)   Other items to address today:   Discussed yearly eye/dental exams Discussed immunizations    Other Screening:  Last lipid screening:  06/17/2018  ADVANCE DIRECTIVES: Discussed: Yes (copy requested) On File: no Materials Provided: no  Immunization status:  Immunization History  Administered Date(s) Administered  . Influenza, High Dose Seasonal PF 03/03/2016, 02/08/2018  . Influenza,inj,Quad PF,6+ Mos 03/16/2017  . Influenza-Unspecified 02/05/2006, 01/17/2009, 01/23/2010, 02/11/2011, 02/19/2012, 01/13/2013, 01/17/2014, 02/28/2015  . Pneumococcal Conjugate-13 11/01/2005, 08/01/2013  . Pneumococcal Polysaccharide-23 10/22/2005, 05/11/2017  . Tdap 12/12/2008  . Zoster 11/13/2005  . Zoster Recombinat (Shingrix) 09/03/2016, 01/16/2017     There are no preventive care reminders to display for this patient.   Functional Status Survey: Is the patient deaf or have difficulty hearing?: No Does the patient have difficulty seeing, even when wearing glasses/contacts?: No Does the patient have difficulty concentrating, remembering, or making decisions?: No Does the patient have difficulty walking or climbing stairs?: No Does the patient have difficulty dressing or bathing?: No Does the patient have difficulty doing errands alone such as visiting a doctor's office or shopping?: No   6CIT Screen 09/15/2018 03/16/2017  What Year? 0 points 0 points  What month? 0 points 0 points  What time? 0 points 0 points  Count back from 20 0 points 0 points  Months in reverse 0 points 0 points  Repeat phrase 0 points 0 points  Total Score 0 0        Clinical Support from 09/15/2018 in [facility]  AUDIT-C Score  5       Home  Environment:   Lives in one story home  No trouble climbing stairs No scattered rugs Yes grab bars.   Patient Active Problem List   Diagnosis Date Noted  . Lumbar radiculopathy 01/11/2018  . Transient alteration of awareness 01/06/2018  . Abnormal head CT 01/06/2018  . Hypertension 09/18/2016  . Benign essential hypertension 11/18/2015  . Acquired hypothyroidism 08/30/2015  . 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 Hypercholesteremia 08/30/2015  . Lumbar stenosis with neurogenic claudication 03/11/2012     Past Medical History:  Diagnosis Date  . Anxiety   . Arthritis   . BPH (benign prostatic hyperplasia)   . Bruises easily   . Cancer (HCC)    throat  . Complication of anesthesia    difficult urination after anesthesia  . Dizziness   . GERD (gastroesophageal reflux disease)   . Glaucoma    left worse than right  . H/O hiatal hernia   . Heart murmur   . Hyperlipemia   . Hypertension    takes meds daily  . Hypothyroidism   . Transient alteration of awareness    ~ 12/27/17, evaluated by neurologist Dr. Felecia Shelling 01/06/18  . Urinary retention      Past Surgical History:  Procedure Laterality Date  . ANTERIOR LAT LUMBAR FUSION  03/11/2012   Procedure: ANTERIOR LATERAL LUMBAR FUSION 1 LEVEL;  Surgeon: Charlie Pitter, MD;  Location: [place] [facility];  Service: Neurosurgery;  Laterality: Left;  Left lumbar four-five extreme lumbar interbody fusion with percutaneous pedicle screws   . BACK SURGERY     ruptured disc repair  . COLONOSCOPY W/ POLYPECTOMY    .  EYE SURGERY     left cataract  . HERNIA REPAIR    . INGUINAL HERNIA REPAIR Right 05/22/2014   Procedure: REPAIR RECURRENT RIGHT INGUINAL HERNIA;  Surgeon: Doreen Salvage, MD;  Location: [place];  Service: General;  Laterality: Right;  . INSERTION OF MESH Right 05/22/2014   Procedure: INSERTION OF MESH;  Surgeon: Doreen Salvage, MD;  Location: [place];  Service: General;  Laterality: Right;  . LUMBAR LAMINECTOMY/DECOMPRESSION  MICRODISCECTOMY Left 01/11/2018   Procedure: Laminectomy and Foraminotomy - Left - Lumbar Three-Lumbar Four - Lumbar Five-Sacral One;  Surgeon: Earnie Larsson, MD;  Location: [facility];  Service: Neurosurgery;  Laterality: Left;  Laminectomy and Foraminotomy - Left - Lumbar Three-Lumbar Four - Lumbar Five-Sacral One  . LUMBAR PERCUTANEOUS PEDICLE SCREW 1 LEVEL  03/11/2012   Procedure: LUMBAR PERCUTANEOUS PEDICLE SCREW 1 LEVEL;  Surgeon: Charlie Pitter, MD;  Location: [place] [facility];  Service: Neurosurgery;  Laterality: Left;  Left lumbar four-five extreme lumbar interbody fusion with percutaneous pedicle screws   . RADICAL NECK DISSECTION  1989  . ROTATOR CUFF REPAIR     right  . TONSILLECTOMY       History reviewed. No pertinent family history.   Social History   Socioeconomic History  . Marital status: Married    Spouse name: Not on file  . Number of children: Not on file  . Years of education: Not on file  . Highest education level: Not on file  Occupational History  . Not on file  Social Needs  . Financial resource strain: Not on file  . Food insecurity:    Worry: Not on file    Inability: Not on file  . Transportation needs:    Medical: Not on file    Non-medical: Not on file  Tobacco Use  . Smoking status: Former Research scientist (life sciences)  . Smokeless tobacco: Never Used  Substance and Sexual Activity  . Alcohol use: Yes    Comment: 1 or 2 beers a week  . Drug use: No  . Sexual activity: Not Currently  Lifestyle  . Physical activity:    Days per week: Not on file    Minutes per session: Not on file  . Stress: Not on file  Relationships  . Social connections:    Talks on phone: Not on file    Gets together: Not on file    Attends religious service: Not on file    Active member of club or organization: Not on file    Attends meetings of clubs or organizations: Not on file    Relationship status: Not on file  . Intimate partner violence:    Fear of current or ex partner: Not on file     Emotionally abused: Not on file    Physically abused: Not on file    Forced sexual activity: Not on file  Other Topics Concern  . Not on file  Social History Narrative  . Not on file     Allergies  Allergen Reactions  . Codeine Nausea Only     Prior to Admission medications   Medication Sig Start Date End Date Taking? Authorizing Provider  amLODipine (NORVASC) 2.5 MG tablet TAKE ONE TABLET BY MOUTH DAILY 09/14/18  Yes Derek Agreste, MD  aspirin 81 MG tablet Take 81 mg by mouth at bedtime.   Yes [provider]  dorzolamide-timolol (COSOPT) 22.3-6.8 MG/ML ophthalmic solution Place 1 drop into both eyes 2 (two) times daily.   Yes [provider]  doxazosin (CARDURA) 8 MG tablet Take 1 tablet (8 mg total) by mouth daily. 03/09/18  Yes Derek Agreste, MD  escitalopram (LEXAPRO) 20 MG tablet Take 1 tablet (20 mg total) by mouth daily. 03/09/18  Yes Derek Agreste, MD  fluocinonide cream (LIDEX) 4.48 % Apply 1 application topically 2 (two) times daily. 03/09/18  Yes Derek Agreste, MD  fluticasone (FLONASE) 50 MCG/ACT nasal spray Place 1 spray into both nostrils daily. Patient taking differently: Place 1 spray into both nostrils daily as needed for allergies.  09/02/17  Yes Derek Agreste, MD  furosemide (LASIX) 20 MG tablet TAKE ONE TABLET BY MOUTH DAILY 08/23/18  Yes Derek Agreste, MD  ipratropium (ATROVENT) 0.03 % nasal spray Place 1 spray into both nostrils 2 (two) times daily as needed for rhinitis. 09/02/17  Yes Derek Agreste, MD  labetalol (NORMODYNE) 100 MG tablet Take 100 mg by mouth 2 (two) times daily.   Yes [provider]  levothyroxine (SYNTHROID, LEVOTHROID) 50 MCG tablet Take 1 tablet (50 mcg total) by mouth daily. 03/09/18  Yes Derek Agreste, MD  losartan (COZAAR) 100 MG tablet Take 1 tablet (100 mg total) by mouth daily. 03/09/18  Yes Derek Agreste, MD  omeprazole (PRILOSEC) 20 MG capsule Take 1 capsule (20 mg total) by mouth  every other day. IN THE MORNING 03/09/18  Yes Derek Agreste, MD  simvastatin (ZOCOR) 20 MG tablet Take 1 tablet (20 mg total) by mouth every evening. 09/02/17  Yes Derek Agreste, MD  Tafluprost 0.0015 % SOLN Place 1 drop into both eyes at bedtime. ZIOPTAN (tafluprost) 09/15/16  Yes [provider]     Depression screen [facility] 2/9 09/15/2018 06/17/2018 03/09/2018 12/31/2017 12/28/2017  Decreased Interest 0 0 0 0 0  Down, Depressed, Hopeless 0 0 0 0 0  PHQ - 2 Score 0 0 0 0 0     Fall Risk  09/15/2018 06/17/2018 03/09/2018 12/31/2017 12/28/2017  Falls in the past year? 0 0 0 No No  Number falls in past yr: 0 0 - - -  Injury with Fall? 0 0 - - -      PHYSICAL EXAM: BP 125/64 Comment: PATIENT TOOK AT HOME  Ht 5\' 9"  (1.753 m)   Wt 181 lb (82.1 kg)   BMI 26.73 kg/m    Wt Readings from Last 3 Encounters:  09/15/18 181 lb (82.1 kg)  06/17/18 181 lb (82.1 kg)  03/09/18 180 lb 12.8 oz (82 kg)     No exam data present    Physical Exam   Education/Counseling provided regarding diet and exercise, prevention of chronic diseases, smoking/tobacco cessation, if applicable, and reviewed "Covered Medicare Preventive Services."   ASSESSMENT/PLAN: There are no diagnoses linked to this encounter.

## 2018-09-22 ENCOUNTER — Other Ambulatory Visit: Payer: Self-pay | Admitting: Family Medicine

## 2018-09-22 DIAGNOSIS — I10 Essential (primary) hypertension: Secondary | ICD-10-CM

## 2018-09-29 DIAGNOSIS — M5416 Radiculopathy, lumbar region: Secondary | ICD-10-CM | POA: Diagnosis not present

## 2018-10-07 DIAGNOSIS — M5416 Radiculopathy, lumbar region: Secondary | ICD-10-CM | POA: Diagnosis not present

## 2018-10-10 DIAGNOSIS — L821 Other seborrheic keratosis: Secondary | ICD-10-CM | POA: Diagnosis not present

## 2018-10-10 DIAGNOSIS — B078 Other viral warts: Secondary | ICD-10-CM | POA: Diagnosis not present

## 2018-10-10 DIAGNOSIS — D225 Melanocytic nevi of trunk: Secondary | ICD-10-CM | POA: Diagnosis not present

## 2018-10-10 DIAGNOSIS — L570 Actinic keratosis: Secondary | ICD-10-CM | POA: Diagnosis not present

## 2018-10-10 DIAGNOSIS — L820 Inflamed seborrheic keratosis: Secondary | ICD-10-CM | POA: Diagnosis not present

## 2018-10-10 DIAGNOSIS — Z85828 Personal history of other malignant neoplasm of skin: Secondary | ICD-10-CM | POA: Diagnosis not present

## 2018-10-10 DIAGNOSIS — D1801 Hemangioma of skin and subcutaneous tissue: Secondary | ICD-10-CM | POA: Diagnosis not present

## 2018-10-11 DIAGNOSIS — M5117 Intervertebral disc disorders with radiculopathy, lumbosacral region: Secondary | ICD-10-CM | POA: Diagnosis not present

## 2018-10-11 DIAGNOSIS — M5416 Radiculopathy, lumbar region: Secondary | ICD-10-CM | POA: Diagnosis not present

## 2018-10-11 DIAGNOSIS — M4807 Spinal stenosis, lumbosacral region: Secondary | ICD-10-CM | POA: Diagnosis not present

## 2018-10-12 ENCOUNTER — Other Ambulatory Visit: Payer: Self-pay | Admitting: Family Medicine

## 2018-10-12 DIAGNOSIS — M5416 Radiculopathy, lumbar region: Secondary | ICD-10-CM | POA: Diagnosis not present

## 2018-10-12 DIAGNOSIS — R0989 Other specified symptoms and signs involving the circulatory and respiratory systems: Secondary | ICD-10-CM

## 2018-10-17 ENCOUNTER — Telehealth (INDEPENDENT_AMBULATORY_CARE_PROVIDER_SITE_OTHER): Payer: Medicare HMO | Admitting: Family Medicine

## 2018-10-17 ENCOUNTER — Other Ambulatory Visit: Payer: Self-pay

## 2018-10-17 DIAGNOSIS — I10 Essential (primary) hypertension: Secondary | ICD-10-CM | POA: Diagnosis not present

## 2018-10-17 DIAGNOSIS — K219 Gastro-esophageal reflux disease without esophagitis: Secondary | ICD-10-CM

## 2018-10-17 DIAGNOSIS — E039 Hypothyroidism, unspecified: Secondary | ICD-10-CM

## 2018-10-17 DIAGNOSIS — R0989 Other specified symptoms and signs involving the circulatory and respiratory systems: Secondary | ICD-10-CM

## 2018-10-17 DIAGNOSIS — E785 Hyperlipidemia, unspecified: Secondary | ICD-10-CM

## 2018-10-17 MED ORDER — LEVOTHYROXINE SODIUM 50 MCG PO TABS: 50.0000 ug | ORAL_TABLET | Freq: Every day | ORAL | 1 refills | Status: DC

## 2018-10-17 MED ORDER — FUROSEMIDE 20 MG PO TABS: 20.0000 mg | ORAL_TABLET | Freq: Every day | ORAL | 1 refills | Status: DC

## 2018-10-17 MED ORDER — AMLODIPINE BESYLATE 2.5 MG PO TABS: 2.5000 mg | ORAL_TABLET | Freq: Every day | ORAL | 1 refills | Status: DC

## 2018-10-17 MED ORDER — OMEPRAZOLE 20 MG PO CPDR: 20.0000 mg | DELAYED_RELEASE_CAPSULE | ORAL | 1 refills | Status: DC

## 2018-10-17 MED ORDER — DOXAZOSIN MESYLATE 8 MG PO TABS: 8.0000 mg | ORAL_TABLET | Freq: Every day | ORAL | 1 refills | Status: DC

## 2018-10-17 MED ORDER — SIMVASTATIN 20 MG PO TABS: 20.0000 mg | ORAL_TABLET | Freq: Every evening | ORAL | 3 refills | Status: DC

## 2018-10-17 MED ORDER — LOSARTAN POTASSIUM 100 MG PO TABS: 100.0000 mg | ORAL_TABLET | Freq: Every day | ORAL | 1 refills | Status: DC

## 2018-10-17 MED ORDER — LABETALOL HCL 100 MG PO TABS: 100.0000 mg | ORAL_TABLET | Freq: Two times a day (BID) | ORAL | 1 refills | Status: DC

## 2018-10-17 NOTE — Progress Notes (Signed)
Virtual Visit via Telephone Note  I connected with [name] on 10/17/18 at 9:28 AM by telephone and verified that I am speaking with the correct person using two identifiers.   I discussed the limitations, risks, security and privacy concerns of performing an evaluation and management service by telephone and the availability of in person appointments. I also discussed with the patient that there may be a patient responsible charge related to this service. The patient expressed understanding and agreed to proceed, consent obtained  Chief complaint:  HTN, med review.   History of Present Illness: [name] is a 78 y.o. male  Hypertension: BP Readings from Last 3 Encounters:  09/15/18 125/64  06/17/18 (!) 210/100  03/09/18 128/73   Lab Results  Component Value Date   CREATININE 1.17 06/17/2018  Followed by Dr.Nwobu with [place] at [facility].  Last seen March 9. Most recently added 2.5 mg of amlodipine prior to that visit, home readings reportedly 1 10-1 30s over 60s and 70s when he met with nephrology.  Continued on amlodipine 2.5 mg daily, doxazosin 8 mg daily, labetalol 100 mg twice daily, losartan 100 mg daily, furosemide 20 mg qd.  Recommended to continue low-salt diet, exercise, increase water intake. Next appt in September.   BP has running good past few months - not running high as in past.   Recent home readings: 128/60-70.   Constitutional: Negative for fatigue and unexpected weight change.  Eyes: Negative for visual disturbance.  Respiratory: Negative for cough, chest tightness and shortness of breath.   Cardiovascular: Negative for chest pain, palpitations and leg swelling.  Gastrointestinal: Negative for abdominal pain and blood in stool.  Neurological: Negative for dizziness, light-headedness and headaches.    Hyperlipidemia:  Lab Results  Component Value Date   CHOL 134 06/17/2018   HDL 71 06/17/2018   LDLCALC  48 06/17/2018   TRIG 75 06/17/2018   CHOLHDL 1.9 06/17/2018   Lab Results  Component Value Date   ALT 13 06/17/2018   AST 14 06/17/2018   ALKPHOS 80 06/17/2018   BILITOT 0.6 06/17/2018   zocor 9m qd. No new myalgias/side effects.    Hypothyroidism: Lab Results  Component Value Date   TSH 6.950 (H) 06/17/2018   - no med changes at last visit - plan for close monitoring.  No new hair changes/skin changes, temp instability.      Patient Active Problem List   Diagnosis Date Noted  . Lumbar radiculopathy 01/11/2018  . Transient alteration of awareness 01/06/2018  . Abnormal head CT 01/06/2018  . Hypertension 09/18/2016  . Benign essential hypertension 11/18/2015  . Acquired hypothyroidism 08/30/2015  . 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 Hypercholesteremia 08/30/2015  . 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 Anxiety   . Arthritis   . BPH (benign prostatic hyperplasia)   . Bruises easily   . Cancer (HCC)    throat  . Complication of anesthesia    difficult urination after anesthesia  . Dizziness   . GERD (gastroesophageal reflux disease)   . Glaucoma    left worse than right  . H/O hiatal hernia   . Heart murmur   . Hyperlipemia   . Hypertension    takes meds daily  . Hypothyroidism   . Transient alteration of awareness    ~ 12/27/17, evaluated by neurologist Dr. SFelecia Shelling9/5/19  . Urinary retention    Past Surgical  History:  Procedure Laterality Date  . ANTERIOR LAT LUMBAR FUSION  03/11/2012   Procedure: ANTERIOR LATERAL LUMBAR FUSION 1 LEVEL;  Surgeon: Charlie Pitter, MD;  Location: [place] [facility];  Service: Neurosurgery;  Laterality: Left;  Left lumbar four-five extreme lumbar interbody fusion with percutaneous pedicle screws   . BACK SURGERY     ruptured disc repair  . COLONOSCOPY W/ POLYPECTOMY    . EYE SURGERY     left cataract  . HERNIA REPAIR    . INGUINAL HERNIA REPAIR Right 05/22/2014    Procedure: REPAIR RECURRENT RIGHT INGUINAL HERNIA;  Surgeon: Doreen Salvage, MD;  Location: [place];  Service: General;  Laterality: Right;  . INSERTION OF MESH Right 05/22/2014   Procedure: INSERTION OF MESH;  Surgeon: Doreen Salvage, MD;  Location: [facility];  Service: General;  Laterality: Right;  . LUMBAR LAMINECTOMY/DECOMPRESSION MICRODISCECTOMY Left 01/11/2018   Procedure: Laminectomy and Foraminotomy - Left - Lumbar Three-Lumbar Four - Lumbar Five-Sacral One;  Surgeon: Earnie Larsson, MD;  Location: [place];  Service: Neurosurgery;  Laterality: Left;  Laminectomy and Foraminotomy - Left - Lumbar Three-Lumbar Four - Lumbar Five-Sacral One  . LUMBAR PERCUTANEOUS PEDICLE SCREW 1 LEVEL  03/11/2012   Procedure: LUMBAR PERCUTANEOUS PEDICLE SCREW 1 LEVEL;  Surgeon: Charlie Pitter, MD;  Location: [place] [facility];  Service: Neurosurgery;  Laterality: Left;  Left lumbar four-five extreme lumbar interbody fusion with percutaneous pedicle screws   . RADICAL NECK DISSECTION  1989  . ROTATOR CUFF REPAIR     right  . TONSILLECTOMY     Allergies  Allergen Reactions  . Codeine Nausea Only   Prior to Admission medications   Medication Sig Start Date End Date Taking? Authorizing Provider  amLODipine (NORVASC) 2.5 MG tablet TAKE ONE TABLET BY MOUTH DAILY 10/12/18  Yes [name], MD  aspirin 81 MG tablet Take 81 mg by mouth at bedtime.   Yes [provider]  dorzolamide-timolol (COSOPT) 22.3-6.8 MG/ML ophthalmic solution Place 1 drop into both eyes 2 (two) times daily.   Yes [provider]  doxazosin (CARDURA) 8 MG tablet Take 1 tablet (8 mg total) by mouth daily. 03/09/18  Yes [name], MD  escitalopram (LEXAPRO) 20 MG tablet Take 1 tablet (20 mg total) by mouth daily. 03/09/18  Yes [name], MD  fluocinonide cream (LIDEX) 2.35 % Apply 1 application topically 2 (two) times daily. 03/09/18  Yes [name], MD  fluticasone (FLONASE) 50 MCG/ACT nasal spray Place 1 spray into both nostrils  daily. Patient taking differently: Place 1 spray into both nostrils daily as needed for allergies.  09/02/17  Yes [name], MD  furosemide (LASIX) 20 MG tablet TAKE ONE TABLET BY MOUTH DAILY 08/23/18  Yes [name], MD  ipratropium (ATROVENT) 0.03 % nasal spray Place 1 spray into both nostrils 2 (two) times daily as needed for rhinitis. 09/02/17  Yes [name], MD  labetalol (NORMODYNE) 100 MG tablet Take 100 mg by mouth 2 (two) times daily.   Yes [provider]  levothyroxine (SYNTHROID, LEVOTHROID) 50 MCG tablet Take 1 tablet (50 mcg total) by mouth daily. 03/09/18  Yes [name], MD  losartan (COZAAR) 100 MG tablet TAKE ONE TABLET BY MOUTH DAILY 09/22/18  Yes [name], MD  omeprazole (PRILOSEC) 20 MG capsule Take 1 capsule (20 mg total) by mouth every other day. IN THE MORNING 03/09/18  Yes [name], MD  simvastatin (ZOCOR) 20 MG  tablet Take 1 tablet (20 mg total) by mouth every evening. 09/02/17  Yes [name], MD  Tafluprost 0.0015 % SOLN Place 1 drop into both eyes at bedtime. ZIOPTAN (tafluprost) 09/15/16  Yes [provider]   Social History   Socioeconomic History  . Marital status: Married    Spouse name: Not on file  . Number of children: Not on file  . Years of education: Not on file  . Highest education level: Not on file  Occupational History  . Not on file  Social Needs  . Financial resource strain: Not on file  . Food insecurity    Worry: Not on file    Inability: Not on file  . Transportation needs    Medical: Not on file    Non-medical: Not on file  Tobacco Use  . Smoking status: Former Research scientist (life sciences)  . Smokeless tobacco: Never Used  Substance and Sexual Activity  . Alcohol use: Yes    Comment: 1 or 2 beers a week  . Drug use: No  . Sexual activity: Not Currently  Lifestyle  . Physical activity    Days per week: Not on file    Minutes per session: Not on file  . Stress: Not on file  Relationships   . Social Herbalist on phone: Not on file    Gets together: Not on file    Attends religious service: Not on file    Active member of club or organization: Not on file    Attends meetings of clubs or organizations: Not on file    Relationship status: Not on file  . Intimate partner violence    Fear of current or ex partner: Not on file    Emotionally abused: Not on file    Physically abused: Not on file    Forced sexual activity: Not on file  Other Topics Concern  . Not on file  Social History Narrative  . Not on file     Observations/Objective: Appropriate responses, no distress, understand expressed of plan with all questions answered.  Assessment and Plan: Essential hypertension - Plan: furosemide (LASIX) 20 MG tablet, losartan (COZAAR) 100 MG tablet, doxazosin (CARDURA) 8 MG tablet,   -  Stable, tolerating current regimen. Medications refilled. Labs pending at nephrology.   Hyperlipidemia, unspecified hyperlipidemia type - Plan: simvastatin (ZOCOR) 20 MG tablet,   -Tolerating statin, continue same.  Plan for lipid panel with labs at nephrology if possible, otherwise can place lab only visit order.  Recheck 6 months  Hypothyroidism, unspecified type - Plan: TSH + free T4, levothyroxine (SYNTHROID) 50 MCG tablet,   -Borderline TSH previously.  Denies any new symptoms.  Continue same dose Synthroid with plan for TSH/free T4 as lab only visit  Labile hypertension - Plan: amLODipine (NORVASC) 2.5 MG tablet,   -Now stabilized on current regimen without any side effects.  Continue same.  Follow-up with nephrology as planned  Gastroesophageal reflux disease, esophagitis presence not specified - Plan: omeprazole (PRILOSEC) 20 MG capsule,   Follow Up Instructions: 84-monthfollow-up with lab visit in the next few weeks Patient Instructions    Good talking to you today.  No change in medications for now.  Please return for a lab visit to check your thyroid test, but  other testing can be done at your next visit with nephrology.  If they are able to add a lipid panel that will help, otherwise I can recheck that here at our office.  Let  me know if there are questions.   If you have lab work done today you will be contacted with your lab results within the next 2 weeks.  If you have not heard from [place] then please contact us. The fastest way to get your results is to register for My Chart.   IF you received an x-ray today, you will receive an invoice from [facility]. Please contact [facility] at [phone] with questions or concerns regarding your invoice.   IF you received labwork today, you will receive an invoice from [place]. Please contact [facility] at [phone] with questions or concerns regarding your invoice.   Our billing staff will not be able to assist you with questions regarding bills from these companies.  You will be contacted with the lab results as soon as they are available. The fastest way to get your results is to activate your My Chart account. Instructions are located on the last page of this paperwork. If you have not heard from [place] regarding the results in 2 weeks, please contact this office.          I discussed the assessment and treatment plan with the patient. The patient was provided an opportunity to ask questions and all were answered. The patient agreed with the plan and demonstrated an understanding of the instructions.   The patient was advised to call back or seek an in-person evaluation if the symptoms worsen or if the condition fails to improve as anticipated.  I provided 8 minutes of non-face-to-face time during this encounter.  Signed,   Merri Ray, MD [facility].  10/17/18

## 2018-10-17 NOTE — Patient Instructions (Addendum)
  Good talking to you today.  No change in medications for now.  Please return for a lab visit to check your thyroid test, but other testing can be done at your next visit with nephrology.  If they are able to add a lipid panel that will help, otherwise I can recheck that here at our office.  Let me know if there are questions.   If you have lab work done today you will be contacted with your lab results within the next 2 weeks.  If you have not heard from [place] then please contact us. The fastest way to get your results is to register for My Chart.   IF you received an x-ray today, you will receive an invoice from [facility]. Please contact [facility] at [phone] with questions or concerns regarding your invoice.   IF you received labwork today, you will receive an invoice from [place]. Please contact [facility] at [phone] with questions or concerns regarding your invoice.   Our billing staff will not be able to assist you with questions regarding bills from these companies.  You will be contacted with the lab results as soon as they are available. The fastest way to get your results is to activate your My Chart account. Instructions are located on the last page of this paperwork. If you have not heard from [place] regarding the results in 2 weeks, please contact this office.

## 2018-10-17 NOTE — Progress Notes (Signed)
CC- 6 month f/u Bp- Patient his bp has been running normal. Bp has been in the 115/60-128/70 range. Patient stated he was having issus with his but he f/u with his surgeon and they did an MRI and everything looked fine and suggest he may need injection for his back pain. Other than that patient stated he is doing fine.

## 2018-10-18 ENCOUNTER — Other Ambulatory Visit: Payer: Self-pay

## 2018-10-18 ENCOUNTER — Ambulatory Visit (INDEPENDENT_AMBULATORY_CARE_PROVIDER_SITE_OTHER): Payer: Medicare HMO | Admitting: Family Medicine

## 2018-10-18 DIAGNOSIS — M5416 Radiculopathy, lumbar region: Secondary | ICD-10-CM

## 2018-10-18 DIAGNOSIS — E039 Hypothyroidism, unspecified: Secondary | ICD-10-CM

## 2018-10-19 LAB — BUN+CREAT
BUN/Creatinine Ratio: 10 (ref 10–24)
BUN: 12 mg/dL (ref 8–27)
Creatinine, Ser: 1.15 mg/dL (ref 0.76–1.27)
GFR calc Af Amer: 71 mL/min/{1.73_m2} (ref >59)
GFR calc non Af Amer: 61 mL/min/{1.73_m2} (ref >59)

## 2018-10-19 LAB — TSH+FREE T4
Free T4: 0.87 ng/dL (ref 0.82–1.77)
TSH: 8.87 u[IU]/mL — ABNORMAL HIGH (ref 0.450–4.500)

## 2018-10-25 DIAGNOSIS — M48062 Spinal stenosis, lumbar region with neurogenic claudication: Secondary | ICD-10-CM | POA: Diagnosis not present

## 2018-10-25 DIAGNOSIS — M961 Postlaminectomy syndrome, not elsewhere classified: Secondary | ICD-10-CM | POA: Diagnosis not present

## 2018-10-25 DIAGNOSIS — M5416 Radiculopathy, lumbar region: Secondary | ICD-10-CM | POA: Diagnosis not present

## 2018-10-25 DIAGNOSIS — M48061 Spinal stenosis, lumbar region without neurogenic claudication: Secondary | ICD-10-CM | POA: Diagnosis not present

## 2018-10-26 DIAGNOSIS — H60542 Acute eczematoid otitis externa, left ear: Secondary | ICD-10-CM | POA: Diagnosis not present

## 2018-10-31 ENCOUNTER — Telehealth: Payer: Self-pay | Admitting: Family Medicine

## 2018-10-31 NOTE — Telephone Encounter (Signed)
Called in wanting to speak to a manager in regards to his meds being sent to the wrong pharmacy two times.

## 2018-10-31 NOTE — Telephone Encounter (Signed)
Copied from [place] [id number]. Topic: General - Call Back - No Documentation >> Oct 31, 2018  1:45 PM Erick Blinks wrote: Reason for CRM: Pt is requesting call back to discuss pharmacy request for two specific meds (See telephone encounter) Please advise Best Contact: [phone]

## 2018-10-31 NOTE — Telephone Encounter (Signed)
No refill not needed, Pt is requesting that these medications only come from Pharmacy listed and not [facility]. Please advise. Medication: escitalopram (LEXAPRO) 20 MG tablet simvastatin (ZOCOR) 20 MG tablet    Preferred Pharmacy (with phone number or street name):  [place], [place]  [place] [address]  Phone: [phone] Fax: [phone]     Agent: Please be advised that RX refills may take up to 3 business days. We ask that you follow-up with your pharmacy.

## 2018-11-23 DIAGNOSIS — M48062 Spinal stenosis, lumbar region with neurogenic claudication: Secondary | ICD-10-CM | POA: Diagnosis not present

## 2018-11-23 DIAGNOSIS — M5416 Radiculopathy, lumbar region: Secondary | ICD-10-CM | POA: Diagnosis not present

## 2018-12-01 DIAGNOSIS — H2511 Age-related nuclear cataract, right eye: Secondary | ICD-10-CM | POA: Diagnosis not present

## 2018-12-01 DIAGNOSIS — H5201 Hypermetropia, right eye: Secondary | ICD-10-CM | POA: Diagnosis not present

## 2018-12-01 DIAGNOSIS — H52223 Regular astigmatism, bilateral: Secondary | ICD-10-CM | POA: Diagnosis not present

## 2018-12-01 DIAGNOSIS — H33312 Horseshoe tear of retina without detachment, left eye: Secondary | ICD-10-CM | POA: Diagnosis not present

## 2018-12-01 DIAGNOSIS — H35033 Hypertensive retinopathy, bilateral: Secondary | ICD-10-CM | POA: Diagnosis not present

## 2018-12-01 DIAGNOSIS — H26492 Other secondary cataract, left eye: Secondary | ICD-10-CM | POA: Diagnosis not present

## 2018-12-01 DIAGNOSIS — H33322 Round hole, left eye: Secondary | ICD-10-CM | POA: Diagnosis not present

## 2018-12-01 DIAGNOSIS — H57813 Brow ptosis, bilateral: Secondary | ICD-10-CM | POA: Diagnosis not present

## 2018-12-01 DIAGNOSIS — H524 Presbyopia: Secondary | ICD-10-CM | POA: Diagnosis not present

## 2018-12-01 DIAGNOSIS — H401132 Primary open-angle glaucoma, bilateral, moderate stage: Secondary | ICD-10-CM | POA: Diagnosis not present

## 2019-01-02 ENCOUNTER — Telehealth: Payer: Self-pay | Admitting: Family Medicine

## 2019-01-02 DIAGNOSIS — F411 Generalized anxiety disorder: Secondary | ICD-10-CM

## 2019-01-02 NOTE — Telephone Encounter (Signed)
Pt would like a refill on his escitalopram (LEXAPRO) 20 MG tablet [id number]. He would like [place] to use [facility] in [place]. Please advise at [phone]

## 2019-01-03 MED ORDER — ESCITALOPRAM OXALATE 20 MG PO TABS: 20.0000 mg | ORAL_TABLET | Freq: Every day | ORAL | 1 refills | Status: DC

## 2019-01-03 NOTE — Telephone Encounter (Signed)
Dr Carlota Raspberry advise.

## 2019-01-03 NOTE — Telephone Encounter (Signed)
Doing well at June visit - appt. In December. Refilled for 6 months.

## 2019-01-04 DIAGNOSIS — I10 Essential (primary) hypertension: Secondary | ICD-10-CM | POA: Diagnosis not present

## 2019-01-11 DIAGNOSIS — D61818 Other pancytopenia: Secondary | ICD-10-CM | POA: Insufficient documentation

## 2019-01-11 DIAGNOSIS — I10 Essential (primary) hypertension: Secondary | ICD-10-CM | POA: Diagnosis not present

## 2019-01-11 DIAGNOSIS — D649 Anemia, unspecified: Secondary | ICD-10-CM | POA: Insufficient documentation

## 2019-01-26 DIAGNOSIS — H02403 Unspecified ptosis of bilateral eyelids: Secondary | ICD-10-CM | POA: Diagnosis not present

## 2019-01-26 DIAGNOSIS — H02834 Dermatochalasis of left upper eyelid: Secondary | ICD-10-CM | POA: Diagnosis not present

## 2019-01-30 ENCOUNTER — Ambulatory Visit (INDEPENDENT_AMBULATORY_CARE_PROVIDER_SITE_OTHER): Payer: Medicare HMO | Admitting: Family Medicine

## 2019-01-30 ENCOUNTER — Other Ambulatory Visit: Payer: Self-pay

## 2019-01-30 DIAGNOSIS — Z23 Encounter for immunization: Secondary | ICD-10-CM

## 2019-02-07 DIAGNOSIS — H02402 Unspecified ptosis of left eyelid: Secondary | ICD-10-CM | POA: Diagnosis not present

## 2019-02-07 DIAGNOSIS — Z20828 Contact with and (suspected) exposure to other viral communicable diseases: Secondary | ICD-10-CM | POA: Diagnosis not present

## 2019-02-07 DIAGNOSIS — H02834 Dermatochalasis of left upper eyelid: Secondary | ICD-10-CM | POA: Diagnosis not present

## 2019-02-07 DIAGNOSIS — K219 Gastro-esophageal reflux disease without esophagitis: Secondary | ICD-10-CM | POA: Insufficient documentation

## 2019-02-07 DIAGNOSIS — Z01812 Encounter for preprocedural laboratory examination: Secondary | ICD-10-CM | POA: Diagnosis not present

## 2019-02-07 DIAGNOSIS — J309 Allergic rhinitis, unspecified: Secondary | ICD-10-CM | POA: Insufficient documentation

## 2019-02-13 DIAGNOSIS — H02403 Unspecified ptosis of bilateral eyelids: Secondary | ICD-10-CM | POA: Diagnosis not present

## 2019-02-13 DIAGNOSIS — E039 Hypothyroidism, unspecified: Secondary | ICD-10-CM | POA: Diagnosis not present

## 2019-02-13 DIAGNOSIS — K219 Gastro-esophageal reflux disease without esophagitis: Secondary | ICD-10-CM | POA: Diagnosis not present

## 2019-02-13 DIAGNOSIS — D649 Anemia, unspecified: Secondary | ICD-10-CM | POA: Diagnosis not present

## 2019-02-13 DIAGNOSIS — N400 Enlarged prostate without lower urinary tract symptoms: Secondary | ICD-10-CM | POA: Diagnosis not present

## 2019-02-13 DIAGNOSIS — H409 Unspecified glaucoma: Secondary | ICD-10-CM | POA: Diagnosis not present

## 2019-02-13 DIAGNOSIS — Z8521 Personal history of malignant neoplasm of larynx: Secondary | ICD-10-CM | POA: Diagnosis not present

## 2019-02-13 DIAGNOSIS — E785 Hyperlipidemia, unspecified: Secondary | ICD-10-CM | POA: Diagnosis not present

## 2019-02-13 DIAGNOSIS — I10 Essential (primary) hypertension: Secondary | ICD-10-CM | POA: Diagnosis not present

## 2019-02-13 DIAGNOSIS — R609 Edema, unspecified: Secondary | ICD-10-CM | POA: Diagnosis not present

## 2019-02-13 DIAGNOSIS — R69 Illness, unspecified: Secondary | ICD-10-CM | POA: Diagnosis not present

## 2019-02-23 DIAGNOSIS — Z9889 Other specified postprocedural states: Secondary | ICD-10-CM | POA: Diagnosis not present

## 2019-02-23 DIAGNOSIS — Z48810 Encounter for surgical aftercare following surgery on the sense organs: Secondary | ICD-10-CM | POA: Diagnosis not present

## 2019-03-10 DIAGNOSIS — M1990 Unspecified osteoarthritis, unspecified site: Secondary | ICD-10-CM | POA: Diagnosis not present

## 2019-03-10 DIAGNOSIS — Z7722 Contact with and (suspected) exposure to environmental tobacco smoke (acute) (chronic): Secondary | ICD-10-CM | POA: Diagnosis not present

## 2019-03-10 DIAGNOSIS — Z008 Encounter for other general examination: Secondary | ICD-10-CM | POA: Diagnosis not present

## 2019-03-10 DIAGNOSIS — K219 Gastro-esophageal reflux disease without esophagitis: Secondary | ICD-10-CM | POA: Diagnosis not present

## 2019-03-10 DIAGNOSIS — I10 Essential (primary) hypertension: Secondary | ICD-10-CM | POA: Diagnosis not present

## 2019-03-10 DIAGNOSIS — R69 Illness, unspecified: Secondary | ICD-10-CM | POA: Diagnosis not present

## 2019-03-10 DIAGNOSIS — E785 Hyperlipidemia, unspecified: Secondary | ICD-10-CM | POA: Diagnosis not present

## 2019-03-10 DIAGNOSIS — H04129 Dry eye syndrome of unspecified lacrimal gland: Secondary | ICD-10-CM | POA: Diagnosis not present

## 2019-03-10 DIAGNOSIS — J309 Allergic rhinitis, unspecified: Secondary | ICD-10-CM | POA: Diagnosis not present

## 2019-03-10 DIAGNOSIS — N529 Male erectile dysfunction, unspecified: Secondary | ICD-10-CM | POA: Diagnosis not present

## 2019-03-10 DIAGNOSIS — E039 Hypothyroidism, unspecified: Secondary | ICD-10-CM | POA: Diagnosis not present

## 2019-04-10 DIAGNOSIS — Z48810 Encounter for surgical aftercare following surgery on the sense organs: Secondary | ICD-10-CM | POA: Diagnosis not present

## 2019-04-10 DIAGNOSIS — Z9889 Other specified postprocedural states: Secondary | ICD-10-CM | POA: Diagnosis not present

## 2019-04-11 DIAGNOSIS — L565 Disseminated superficial actinic porokeratosis (DSAP): Secondary | ICD-10-CM | POA: Diagnosis not present

## 2019-04-11 DIAGNOSIS — D0439 Carcinoma in situ of skin of other parts of face: Secondary | ICD-10-CM | POA: Diagnosis not present

## 2019-04-11 DIAGNOSIS — L820 Inflamed seborrheic keratosis: Secondary | ICD-10-CM | POA: Diagnosis not present

## 2019-04-11 DIAGNOSIS — Z85828 Personal history of other malignant neoplasm of skin: Secondary | ICD-10-CM | POA: Diagnosis not present

## 2019-04-11 DIAGNOSIS — D485 Neoplasm of uncertain behavior of skin: Secondary | ICD-10-CM | POA: Diagnosis not present

## 2019-04-11 DIAGNOSIS — L720 Epidermal cyst: Secondary | ICD-10-CM | POA: Diagnosis not present

## 2019-04-11 DIAGNOSIS — D1801 Hemangioma of skin and subcutaneous tissue: Secondary | ICD-10-CM | POA: Diagnosis not present

## 2019-04-11 DIAGNOSIS — L821 Other seborrheic keratosis: Secondary | ICD-10-CM | POA: Diagnosis not present

## 2019-04-11 DIAGNOSIS — L570 Actinic keratosis: Secondary | ICD-10-CM | POA: Diagnosis not present

## 2019-04-19 ENCOUNTER — Ambulatory Visit: Payer: Medicare HMO | Admitting: Family Medicine

## 2019-04-19 DIAGNOSIS — H60542 Acute eczematoid otitis externa, left ear: Secondary | ICD-10-CM | POA: Diagnosis not present

## 2019-04-20 ENCOUNTER — Other Ambulatory Visit: Payer: Self-pay

## 2019-04-20 ENCOUNTER — Ambulatory Visit (INDEPENDENT_AMBULATORY_CARE_PROVIDER_SITE_OTHER): Payer: Medicare HMO | Admitting: Family Medicine

## 2019-04-20 ENCOUNTER — Encounter: Payer: Self-pay | Admitting: Family Medicine

## 2019-04-20 VITALS — BP 100/60 | HR 66 | Temp 97.6°F | Wt 184.4 lb

## 2019-04-20 DIAGNOSIS — I10 Essential (primary) hypertension: Secondary | ICD-10-CM | POA: Diagnosis not present

## 2019-04-20 DIAGNOSIS — E039 Hypothyroidism, unspecified: Secondary | ICD-10-CM | POA: Diagnosis not present

## 2019-04-20 DIAGNOSIS — R0989 Other specified symptoms and signs involving the circulatory and respiratory systems: Secondary | ICD-10-CM

## 2019-04-20 DIAGNOSIS — E785 Hyperlipidemia, unspecified: Secondary | ICD-10-CM

## 2019-04-20 DIAGNOSIS — R69 Illness, unspecified: Secondary | ICD-10-CM | POA: Diagnosis not present

## 2019-04-20 DIAGNOSIS — F411 Generalized anxiety disorder: Secondary | ICD-10-CM

## 2019-04-20 DIAGNOSIS — K219 Gastro-esophageal reflux disease without esophagitis: Secondary | ICD-10-CM

## 2019-04-20 MED ORDER — OMEPRAZOLE 20 MG PO CPDR: 20.0000 mg | DELAYED_RELEASE_CAPSULE | ORAL | 1 refills | Status: DC

## 2019-04-20 MED ORDER — SIMVASTATIN 20 MG PO TABS: 20.0000 mg | ORAL_TABLET | Freq: Every evening | ORAL | 3 refills | Status: DC

## 2019-04-20 MED ORDER — LOSARTAN POTASSIUM 100 MG PO TABS: 100.0000 mg | ORAL_TABLET | Freq: Every day | ORAL | 1 refills | Status: DC

## 2019-04-20 MED ORDER — DOXAZOSIN MESYLATE 8 MG PO TABS: 8.0000 mg | ORAL_TABLET | Freq: Every day | ORAL | 1 refills | Status: DC

## 2019-04-20 MED ORDER — AMLODIPINE BESYLATE 2.5 MG PO TABS: 2.5000 mg | ORAL_TABLET | Freq: Every day | ORAL | 1 refills | Status: DC

## 2019-04-20 MED ORDER — ESCITALOPRAM OXALATE 20 MG PO TABS: 20.0000 mg | ORAL_TABLET | Freq: Every day | ORAL | 1 refills | Status: DC

## 2019-04-20 MED ORDER — LABETALOL HCL 100 MG PO TABS: 100.0000 mg | ORAL_TABLET | Freq: Two times a day (BID) | ORAL | 1 refills | Status: DC

## 2019-04-20 MED ORDER — FUROSEMIDE 20 MG PO TABS: 20.0000 mg | ORAL_TABLET | Freq: Every day | ORAL | 1 refills | Status: DC

## 2019-04-20 MED ORDER — LEVOTHYROXINE SODIUM 50 MCG PO TABS: 50.0000 ug | ORAL_TABLET | Freq: Every day | ORAL | 1 refills | Status: DC

## 2019-04-20 NOTE — Patient Instructions (Addendum)
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 continue same dose of Lexapro for now, but I would recommend meeting with a counselor if those symptoms persist.  Here are a few options: Here are a few options for counseling:  [facility]:  [place] [phone]     Adjustment Disorder, Adult Adjustment disorder is a group of symptoms that can develop after a stressful life event, such as the loss of a job or serious physical illness. The symptoms can affect how you feel, think, and act. They may interfere with your relationships. Adjustment disorder increases your risk of suicide and substance abuse. If this disorder is not managed early, it can develop into a more serious condition, such as major depressive disorder or post-traumatic stress disorder. What are the causes? This condition happens when you have trouble recovering from or coping with a stressful life event. What increases the risk? You are more likely to develop this condition if:  You have had depression or anxiety.  You are being treated for a long-term (chronic) illness.  You are being treated for an illness that cannot be cured (terminal illness).  You have a family history of mental illness. What are the signs or symptoms? Symptoms of this condition include:  Extreme trouble doing daily tasks, such as going to work.  Sadness, depression, or crying spells.  Worrying a lot.  Loss of enjoyment.  Change in appetite or weight.  Feelings of loss or hopelessness.  Thoughts of suicide.  Anxiety, worry, or nervousness.  Trouble sleeping.  Avoiding family and friends.  Fighting or vandalism.  Complaining of feeling sick without being ill.  Feeling dazed or disconnected.  Nightmares.  Trouble sleeping.  Irritability.  Reckless driving.  Poor work Systems analyst.  Ignoring bills. Symptoms of this condition start within three months of the  stressful event. They do not last more than six months, unless the stressful circumstances last longer. Normal grieving after the death of a loved one is not a symptom of this condition. How is this diagnosed? To diagnose this condition, your health care provider will ask about what has happened in your life and how it has affected you. He or she may also ask about your medical history and your use of medicines, alcohol, and other substances. Your health care provider may do a physical exam and order lab tests or other studies. You may be referred to a mental health specialist. How is this treated? Treatment options for this condition include:  Counseling or talk therapy. Talk therapy is usually provided by mental health specialists.  Medicines. Certain medicines may help with depression, anxiety, and sleep.  Support groups. These offer emotional support, advice, and guidance. They are made up of people who have had similar experiences.  Observation and time. This is sometimes called "watchful waiting." In this treatment, health care providers monitor your health and behavior without other treatment. Adjustment disorder sometimes gets better on its own with time. Follow these instructions at home:  Take over-the-counter and prescription medicines only as told by your health care provider.  Keep all follow-up visits as told by your health care provider. This is important. Contact a health care provider if:  Your symptoms do not improve in six months.  Your symptoms get worse. Get help right away if:  You have serious thoughts about hurting yourself or someone else. If you ever feel like you may hurt yourself or others, or have thoughts about taking your own life, get help right  away. You can go to your nearest emergency department or call:  Your local emergency services (911 in the [place]  A suicide crisis helpline, such as the [place] at 215-011-1587. This  is open 24 hours a day. Summary  Adjustment disorder is a group of symptoms that can develop after a stressful life event, such as the loss of a job or serious physical illness. The symptoms can affect how you feel, think, and act. They may interfere with your relationships.  Symptoms of this condition start within three months of the stressful event. They do not last more than six months, unless the stressful circumstances last longer.  Treatment may include talk therapy, medicines, participation in a support group, or observation to see if symptoms improve.  Contact your health care provider if your symptoms get worse or do not improve in six months.  If you ever feel like you may hurt yourself or others, or have thoughts about taking your own life, get help right away. This information is not intended to replace advice given to you by your health care provider. Make sure you discuss any questions you have with your health care provider. Document Released: 12/23/2005 Document Revised: 04/02/2017 Document Reviewed: 06/19/2016 Elsevier Patient Education  [employer].    If you have lab work done today you will be contacted with your lab results within the next 2 weeks.  If you have not heard from [place] then please contact us. The fastest way to get your results is to register for My Chart.   IF you received an x-ray today, you will receive an invoice from [facility]. Please contact [facility] at [phone] with questions or concerns regarding your invoice.   IF you received labwork today, you will receive an invoice from [facility]. Please contact [facility] at [phone] with questions or concerns regarding your invoice.   Our billing staff will not be able to assist you with questions regarding bills from these companies.  You will be contacted with the lab results as soon as they are available. The fastest way to get your results is to activate your My Chart  account. Instructions are located on the last page of this paperwork. If you have not heard from [place] regarding the results in 2 weeks, please contact this office.

## 2019-04-20 NOTE — Progress Notes (Signed)
Subjective:  Patient ID: [name], male    DOB: 06-19-1940  Age: 78 y.o. MRN: [id number]  CC:  Chief Complaint  Patient presents with  . Medical Management of Chronic Issues    6 month f/u. Would like to discuss going up on lexapro to 30 mg if possible and also recieved a letter from  [facility] to switch my nexiun to another kind of med    HPI [name] presents for   Hypothyroidism: Lab Results  Component Value Date   TSH 8.870 (H) 10/18/2018   Taking medication daily.  Synthroid 50 mcg daily.  Borderline TSH in June.  Recommended 6-week follow-up.  No new hot or cold intolerance. No new hair or skin changes, heart palpitations or new fatigue.  Wt Readings from Last 3 Encounters:  04/20/19 184 lb 6.4 oz (83.6 kg)  09/15/18 181 lb (82.1 kg)  06/17/18 181 lb (82.1 kg)    Hypertension: History of labile hypertension, evaluated by Dr.Nwobu with [place] [facility] at [facility] previously.  Last appointment in September.  Stable control at that time, meds were same, planned on following iron profile with his next set of lab work.  He was continued on amlodipine 2.5 mg daily, doxazosin 8 mg daily, labetalol 100 mg twice daily, losartan 100 mg daily. Still on lasix 20mg  QD as well.  Home readings: 120-130/70's. No new side effects with meds.  BP Readings from Last 3 Encounters:  04/20/19 100/60  09/15/18 125/64  06/17/18 (!) 210/100   Lab Results  Component Value Date   CREATININE 1.15 10/18/2018   Hyperlipidemia: Takes Zocor 20 mg daily Lab Results  Component Value Date   CHOL 134 06/17/2018   HDL 71 06/17/2018   LDLCALC 48 06/17/2018   TRIG 75 06/17/2018   CHOLHDL 1.9 06/17/2018   Lab Results  Component Value Date   ALT 13 06/17/2018   AST 14 06/17/2018   ALKPHOS 80 06/17/2018   BILITOT 0.6 06/17/2018    Depression: Controlled when discussed earlier this year Lexapro 20 mg daily. Feels like things irritating him more - past year,  more lately.  Brother died last week, dog passed away few weeks ago.  Exercising as able with weather. Not talking to anyone about recent losses, does not want to talk with anyone at this point. Would like to try higher dose of lexapro, but on highest dose   Depression screen [facility] 2/9 04/20/2019 10/17/2018 09/15/2018 06/17/2018 03/09/2018  Decreased Interest 0 0 0 0 0  Down, Depressed, Hopeless 0 0 0 0 0  PHQ - 2 Score 0 0 0 0 0   GAD 7 : Generalized Anxiety Score 04/20/2019  Nervous, Anxious, on Edge 1  Control/stop worrying 0  Worry too much - different things 0  Trouble relaxing 0  Restless 0  Easily annoyed or irritable 1  Afraid - awful might happen 0  Total GAD 7 Score 2    GERD:   On omeprazole every other day. Controlling heartburn. ? Letter from insurance about coverage - will send me the info.   History Patient Active Problem List   Diagnosis Date Noted  . Lumbar radiculopathy 01/11/2018  . Transient alteration of awareness 01/06/2018  . Abnormal head CT 01/06/2018  . Hypertension 09/18/2016  . Benign essential hypertension 11/18/2015  . Acquired hypothyroidism 08/30/2015  . 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 Hypercholesteremia 08/30/2015  . 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 Anxiety   . Arthritis   . BPH (benign prostatic hyperplasia)   . Bruises easily   . Cancer (HCC)    throat  . Complication of anesthesia    difficult urination after anesthesia  . Dizziness   . GERD (gastroesophageal reflux disease)   . Glaucoma    left worse than right  . H/O hiatal hernia   . Heart murmur   . Hyperlipemia   . Hypertension    takes meds daily  . Hypothyroidism   . Transient alteration of awareness    ~ 12/27/17, evaluated by neurologist Dr. Felecia Shelling 01/06/18  . Urinary retention    Past Surgical History:  Procedure Laterality Date  . ANTERIOR LAT LUMBAR FUSION  03/11/2012   Procedure: ANTERIOR  LATERAL LUMBAR FUSION 1 LEVEL;  Surgeon: Charlie Pitter, MD;  Location: [place] [facility];  Service: Neurosurgery;  Laterality: Left;  Left lumbar four-five extreme lumbar interbody fusion with percutaneous pedicle screws   . BACK SURGERY     ruptured disc repair  . COLONOSCOPY W/ POLYPECTOMY    . EYE SURGERY     left cataract  . HERNIA REPAIR    . INGUINAL HERNIA REPAIR Right 05/22/2014   Procedure: REPAIR RECURRENT RIGHT INGUINAL HERNIA;  Surgeon: Doreen Salvage, MD;  Location: [facility];  Service: General;  Laterality: Right;  . INSERTION OF MESH Right 05/22/2014   Procedure: INSERTION OF MESH;  Surgeon: Doreen Salvage, MD;  Location: [place];  Service: General;  Laterality: Right;  . LUMBAR LAMINECTOMY/DECOMPRESSION MICRODISCECTOMY Left 01/11/2018   Procedure: Laminectomy and Foraminotomy - Left - Lumbar Three-Lumbar Four - Lumbar Five-Sacral One;  Surgeon: Earnie Larsson, MD;  Location: [facility];  Service: Neurosurgery;  Laterality: Left;  Laminectomy and Foraminotomy - Left - Lumbar Three-Lumbar Four - Lumbar Five-Sacral One  . LUMBAR PERCUTANEOUS PEDICLE SCREW 1 LEVEL  03/11/2012   Procedure: LUMBAR PERCUTANEOUS PEDICLE SCREW 1 LEVEL;  Surgeon: Charlie Pitter, MD;  Location: [place] [facility];  Service: Neurosurgery;  Laterality: Left;  Left lumbar four-five extreme lumbar interbody fusion with percutaneous pedicle screws   . RADICAL NECK DISSECTION  1989  . ROTATOR CUFF REPAIR     right  . TONSILLECTOMY     Allergies  Allergen Reactions  . Codeine Nausea Only   Prior to Admission medications   Medication Sig Start Date End Date Taking? Authorizing Provider  amLODipine (NORVASC) 2.5 MG tablet Take 1 tablet (2.5 mg total) by mouth daily. 10/17/18  Yes [name], MD  aspirin 81 MG tablet Take 81 mg by mouth at bedtime.   Yes [provider]  dorzolamide-timolol (COSOPT) 22.3-6.8 MG/ML ophthalmic solution Place 1 drop into both eyes 2 (two) times daily.   Yes [provider]  doxazosin (CARDURA)  8 MG tablet Take 1 tablet (8 mg total) by mouth daily. 10/17/18  Yes [name], MD  escitalopram (LEXAPRO) 20 MG tablet Take 1 tablet (20 mg total) by mouth daily. 01/03/19  Yes [name], MD  fluocinonide cream (LIDEX) [id number] % Apply 1 application topically 2 (two) times daily. 03/09/18  Yes [name], MD  fluticasone (FLONASE) 50 MCG/ACT nasal spray Place 1 spray into both nostrils daily. Patient taking differently: Place 1 spray into both nostrils daily as needed for allergies.  09/02/17  Yes [name], MD  furosemide (LASIX) 20 MG tablet Take 1 tablet (20 mg total) by mouth daily. 10/17/18  Yes [name], MD  ipratropium (ATROVENT) 0.03 % nasal spray Place 1 spray into both nostrils 2 (two) times daily as needed for rhinitis. 09/02/17  Yes [name], MD  labetalol (NORMODYNE) 100 MG tablet Take 1 tablet (100 mg total) by mouth 2 (two) times daily. 10/17/18  Yes [name], MD  levothyroxine (SYNTHROID) 50 MCG tablet Take 1 tablet (50 mcg total) by mouth daily. 10/17/18  Yes [name], MD  losartan (COZAAR) 100 MG tablet Take 1 tablet (100 mg total) by mouth daily. 10/17/18  Yes [name], MD  omeprazole (PRILOSEC) 20 MG capsule Take 1 capsule (20 mg total) by mouth every other day. IN THE MORNING 10/17/18  Yes [name], MD  simvastatin (ZOCOR) 20 MG tablet Take 1 tablet (20 mg total) by mouth every evening. 10/17/18  Yes [name], MD  Tafluprost 0.0015 % SOLN Place 1 drop into both eyes at bedtime. ZIOPTAN (tafluprost) 09/15/16  Yes [provider]   Social History   Socioeconomic History  . Marital status: Married    Spouse name: Not on file  . Number of children: Not on file  . Years of education: Not on file  . Highest education level: Not on file  Occupational History  . Not on file  Tobacco Use  . Smoking status: Former Research scientist (life sciences)  . Smokeless tobacco: Never Used  Substance and Sexual Activity  . Alcohol  use: Yes    Comment: 1 or 2 beers a week  . Drug use: No  . Sexual activity: Not Currently  Other Topics Concern  . Not on file  Social History Narrative  . Not on file   Social Determinants of Health   Financial Resource Strain:   . Difficulty of Paying Living Expenses: Not on file  Food Insecurity:   . Worried About Charity fundraiser in the Last Year: Not on file  . Ran Out of Food in the Last Year: Not on file  Transportation Needs:   . Lack of Transportation (Medical): Not on file  . Lack of Transportation (Non-Medical): Not on file  Physical Activity:   . Days of Exercise per Week: Not on file  . Minutes of Exercise per Session: Not on file  Stress:   . Feeling of Stress : Not on file  Social Connections:   . Frequency of Communication with Friends and Family: Not on file  . Frequency of Social Gatherings with Friends and Family: Not on file  . Attends Religious Services: Not on file  . Active Member of Clubs or Organizations: Not on file  . Attends Archivist Meetings: Not on file  . Marital Status: Not on file  Intimate Partner Violence:   . Fear of Current or Ex-Partner: Not on file  . Emotionally Abused: Not on file  . Physically Abused: Not on file  . Sexually Abused: Not on file    Review of Systems  Constitutional: Negative for fatigue and unexpected weight change (has had some weight gain past year to 1.5 yrs. ).  Eyes: Negative for visual disturbance.  Respiratory: Negative for cough, chest tightness and shortness of breath.   Cardiovascular: Negative for chest pain, palpitations and leg swelling.  Gastrointestinal: Negative for abdominal pain and blood in stool.  Neurological: Negative for dizziness, light-headedness and headaches.     Objective:   Vitals:   04/20/19 1101  BP: 100/60  Pulse: 66  Temp: 97.6 F (36.4 C)  TempSrc: Temporal  SpO2: 99%  Weight: 184 lb 6.4 oz (83.6 kg)     Physical Exam Vitals reviewed.    Constitutional:      Appearance: He is well-developed.  HENT:     Head: Normocephalic and atraumatic.  Eyes:     Pupils: Pupils are equal, round, and reactive to light.  Neck:     Vascular: No carotid bruit or JVD.  Cardiovascular:     Rate and Rhythm: Normal rate and regular rhythm.     Heart sounds: Normal heart sounds. No murmur.  Pulmonary:     Effort: Pulmonary effort is normal.     Breath sounds: Normal breath sounds. No rales.  Skin:    General: Skin is warm and dry.  Neurological:     Mental Status: He is alert and oriented to person, place, and time.        Assessment & Plan:  [name] is a 78 y.o. male . Anxiety state - Plan: escitalopram (LEXAPRO) 20 MG tablet  -On max dose of Lexapro.  Suspect component of adjustment disorder with recent death of family member and pet.  Handout given, as well as phone numbers for counseling. Hypothyroidism, unspecified type - Plan: TSH + free T4, levothyroxine (SYNTHROID) 50 MCG tablet  Essential hypertension - Plan: Comprehensive metabolic panel, losartan (COZAAR) 100 MG tablet, furosemide (LASIX) 20 MG tablet, doxazosin (CARDURA) 8 MG tablet Labile hypertension - Plan: amLODipine (NORVASC) 2.5 MG tablet  -Continue same regimen, labs pending.   Hyperlipidemia, unspecified hyperlipidemia type - Plan: Lipid Panel, Comprehensive metabolic panel, simvastatin (ZOCOR) 20 MG tablet  -Continue same dose Zocor, labs pending  Gastroesophageal reflux disease, unspecified whether esophagitis present Gastroesophageal reflux disease - Plan: omeprazole (PRILOSEC) 20 MG capsule  -Stable with intermittent dosing.  No changes   Meds ordered this encounter  Medications  . labetalol (NORMODYNE) 100 MG tablet    Sig: Take 1 tablet (100 mg total) by mouth 2 (two) times daily.    Dispense:  90 tablet    Refill:  1  . losartan (COZAAR) 100 MG tablet    Sig: Take 1 tablet (100 mg total) by mouth daily.    Dispense:  90 tablet     Refill:  1  . amLODipine (NORVASC) 2.5 MG tablet    Sig: Take 1 tablet (2.5 mg total) by mouth daily.    Dispense:  90 tablet    Refill:  1  . escitalopram (LEXAPRO) 20 MG tablet    Sig: Take 1 tablet (20 mg total) by mouth daily.    Dispense:  90 tablet    Refill:  1  . furosemide (LASIX) 20 MG tablet    Sig: Take 1 tablet (20 mg total) by mouth daily.    Dispense:  90 tablet    Refill:  1  . simvastatin (ZOCOR) 20 MG tablet    Sig: Take 1 tablet (20 mg total) by mouth every evening.    Dispense:  90 tablet    Refill:  3  . omeprazole (PRILOSEC) 20 MG capsule    Sig: Take 1 capsule (20 mg total) by mouth every other day. IN THE MORNING    Dispense:  90 capsule    Refill:  1  . levothyroxine (SYNTHROID) 50 MCG tablet    Sig: Take 1 tablet (50 mcg total) by mouth daily.    Dispense:  90 tablet    Refill:  1  . doxazosin (CARDURA) 8 MG tablet    Sig: Take 1 tablet (8  mg total) by mouth daily.    Dispense:  90 tablet    Refill:  1   Patient Instructions    See info below on adjustment disorder. continue same dose of Lexapro for now, but I would recommend meeting with a counselor if those symptoms persist.  Here are a few options: Here are a few options for counseling:  [facility]:  [place] [phone]     Adjustment Disorder, Adult Adjustment disorder is a group of symptoms that can develop after a stressful life event, such as the loss of a job or serious physical illness. The symptoms can affect how you feel, think, and act. They may interfere with your relationships. Adjustment disorder increases your risk of suicide and substance abuse. If this disorder is not managed early, it can develop into a more serious condition, such as major depressive disorder or post-traumatic stress disorder. What are the causes? This condition happens when you have trouble recovering from or coping with a stressful life  event. What increases the risk? You are more likely to develop this condition if:  You have had depression or anxiety.  You are being treated for a long-term (chronic) illness.  You are being treated for an illness that cannot be cured (terminal illness).  You have a family history of mental illness. What are the signs or symptoms? Symptoms of this condition include:  Extreme trouble doing daily tasks, such as going to work.  Sadness, depression, or crying spells.  Worrying a lot.  Loss of enjoyment.  Change in appetite or weight.  Feelings of loss or hopelessness.  Thoughts of suicide.  Anxiety, worry, or nervousness.  Trouble sleeping.  Avoiding family and friends.  Fighting or vandalism.  Complaining of feeling sick without being ill.  Feeling dazed or disconnected.  Nightmares.  Trouble sleeping.  Irritability.  Reckless driving.  Poor work Systems analyst.  Ignoring bills. Symptoms of this condition start within three months of the stressful event. They do not last more than six months, unless the stressful circumstances last longer. Normal grieving after the death of a loved one is not a symptom of this condition. How is this diagnosed? To diagnose this condition, your health care provider will ask about what has happened in your life and how it has affected you. He or she may also ask about your medical history and your use of medicines, alcohol, and other substances. Your health care provider may do a physical exam and order lab tests or other studies. You may be referred to a mental health specialist. How is this treated? Treatment options for this condition include:  Counseling or talk therapy. Talk therapy is usually provided by mental health specialists.  Medicines. Certain medicines may help with depression, anxiety, and sleep.  Support groups. These offer emotional support, advice, and guidance. They are made up of people who have had similar  experiences.  Observation and time. This is sometimes called "watchful waiting." In this treatment, health care providers monitor your health and behavior without other treatment. Adjustment disorder sometimes gets better on its own with time. Follow these instructions at home:  Take over-the-counter and prescription medicines only as told by your health care provider.  Keep all follow-up visits as told by your health care provider. This is important. Contact a health care provider if:  Your symptoms do not improve in six months.  Your symptoms get worse. Get help right away if:  You have serious thoughts about hurting  yourself or someone else. If you ever feel like you may hurt yourself or others, or have thoughts about taking your own life, get help right away. You can go to your nearest emergency department or call:  Your local emergency services (911 in the [place]  A suicide crisis helpline, such as the [place] at [phone]. This is open 24 hours a day. Summary  Adjustment disorder is a group of symptoms that can develop after a stressful life event, such as the loss of a job or serious physical illness. The symptoms can affect how you feel, think, and act. They may interfere with your relationships.  Symptoms of this condition start within three months of the stressful event. They do not last more than six months, unless the stressful circumstances last longer.  Treatment may include talk therapy, medicines, participation in a support group, or observation to see if symptoms improve.  Contact your health care provider if your symptoms get worse or do not improve in six months.  If you ever feel like you may hurt yourself or others, or have thoughts about taking your own life, get help right away. This information is not intended to replace advice given to you by your health care provider. Make sure you discuss any questions you have with your  health care provider. Document Released: 12/23/2005 Document Revised: 04/02/2017 Document Reviewed: 06/19/2016 Elsevier Patient Education  [employer].    If you have lab work done today you will be contacted with your lab results within the next 2 weeks.  If you have not heard from [place] then please contact us. The fastest way to get your results is to register for My Chart.   IF you received an x-ray today, you will receive an invoice from [facility]. Please contact [facility] at [phone] with questions or concerns regarding your invoice.   IF you received labwork today, you will receive an invoice from [facility]. Please contact [facility] at [phone] with questions or concerns regarding your invoice.   Our billing staff will not be able to assist you with questions regarding bills from these companies.  You will be contacted with the lab results as soon as they are available. The fastest way to get your results is to activate your My Chart account. Instructions are located on the last page of this paperwork. If you have not heard from [place] regarding the results in 2 weeks, please contact this office.          Signed, Merri Ray, MD [facility]

## 2019-04-21 LAB — COMPREHENSIVE METABOLIC PANEL
ALT: 11 IU/L (ref 0–44)
AST: 15 IU/L (ref 0–40)
Albumin/Globulin Ratio: 2.1 (ref 1.2–2.2)
Albumin: 4.1 g/dL (ref 3.7–4.7)
Alkaline Phosphatase: 78 IU/L (ref 39–117)
BUN/Creatinine Ratio: 10 (ref 10–24)
BUN: 11 mg/dL (ref 8–27)
Bilirubin Total: 0.5 mg/dL (ref 0.0–1.2)
CO2: 25 mmol/L (ref 20–29)
Calcium: 8.9 mg/dL (ref 8.6–10.2)
Chloride: 104 mmol/L (ref 96–106)
Creatinine, Ser: 1.09 mg/dL (ref 0.76–1.27)
GFR calc Af Amer: 75 mL/min/{1.73_m2} (ref >59)
GFR calc non Af Amer: 65 mL/min/{1.73_m2} (ref >59)
Globulin, Total: 2 g/dL (ref 1.5–4.5)
Glucose: 86 mg/dL (ref 65–99)
Potassium: 3.8 mmol/L (ref 3.5–5.2)
Sodium: 140 mmol/L (ref 134–144)
Total Protein: 6.1 g/dL (ref 6.0–8.5)

## 2019-04-21 LAB — LIPID PANEL
Chol/HDL Ratio: 1.9 ratio (ref 0.0–5.0)
Cholesterol, Total: 123 mg/dL (ref 100–199)
HDL: 65 mg/dL (ref >39)
LDL Chol Calc (NIH): 38 mg/dL (ref 0–99)
Triglycerides: 110 mg/dL (ref 0–149)
VLDL Cholesterol Cal: 20 mg/dL (ref 5–40)

## 2019-04-21 LAB — TSH+FREE T4
Free T4: 0.88 ng/dL (ref 0.82–1.77)
TSH: 6.57 u[IU]/mL — ABNORMAL HIGH (ref 0.450–4.500)

## 2019-05-03 ENCOUNTER — Telehealth: Payer: Self-pay | Admitting: Family Medicine

## 2019-05-03 NOTE — Telephone Encounter (Signed)
Patient dropped off paperwork from [facility] to Dr. Carlota Raspberry as requested . Paper placed in Provider [place] box at nurses station 12.30.2020 FR

## 2019-05-03 NOTE — Telephone Encounter (Signed)
This has been placed in your box in your office for review

## 2019-05-10 ENCOUNTER — Encounter: Payer: Self-pay | Admitting: Family Medicine

## 2019-05-11 DIAGNOSIS — R69 Illness, unspecified: Secondary | ICD-10-CM | POA: Diagnosis not present

## 2019-05-19 NOTE — Telephone Encounter (Signed)
Note reviewed from [place], recommending patient discuss having a prescription for of proton pump inhibitor.  Omeprazole discussed at his last office visit, and has been prescribed

## 2019-05-24 IMAGING — CT CT HEAD W/O CM
3 series · 15 of 47 positions shown, 18 images · non-contrast
Comparison: MRI head report dated November 17, 2008 though images are
not available for direct comparison.

CLINICAL DATA: Altered mental status after mowing yard yesterday.
Headache. History of hypertension, head and neck cancer,
hyperlipidemia.

EXAM:
CT HEAD WITHOUT CONTRAST
TECHNIQUE: Contiguous axial images were obtained from the base of the skull
through the vertex without intravenous contrast.

[Series 2: head wo · axial · 0.45mm/px · z∈[-100,+25]mm · 9 of 30 slices shown, 12 images]
[im 3/30  brain]
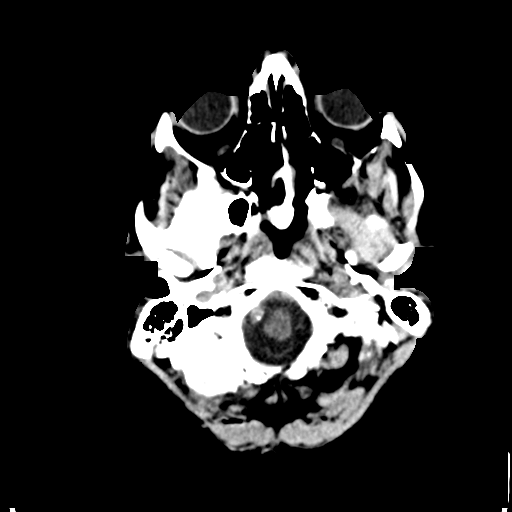
[im 3/30  bone]
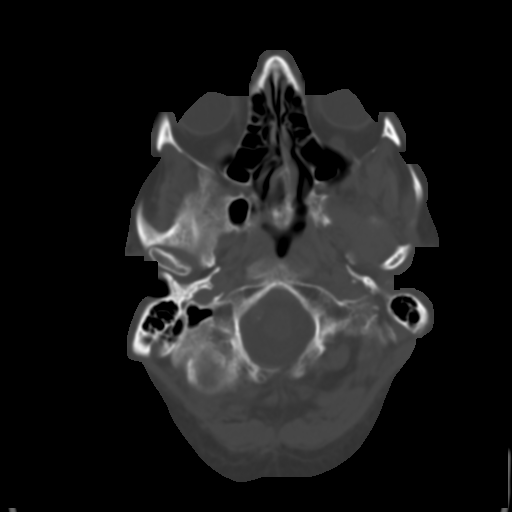
[im 6/30  brain]
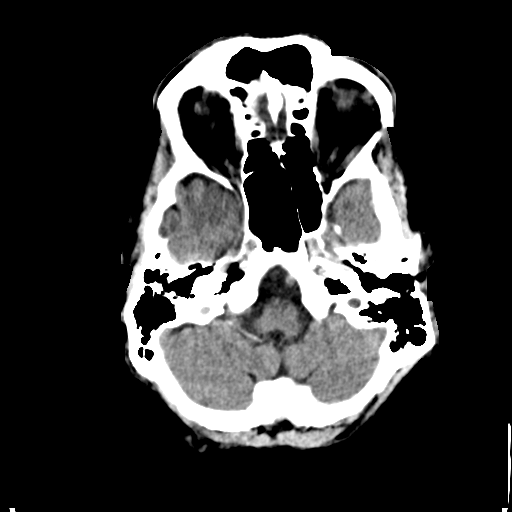
[im 9/30  brain]
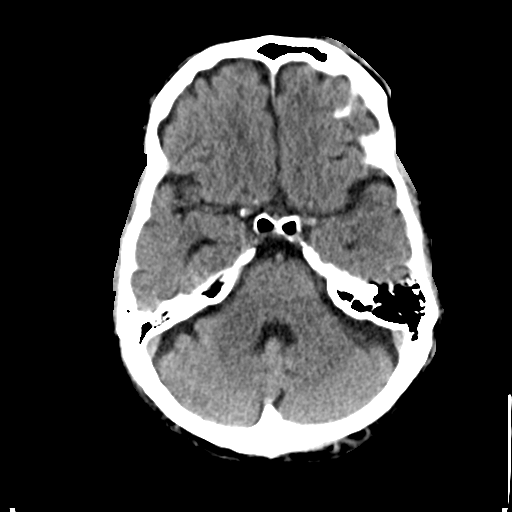
[im 12/30  brain]
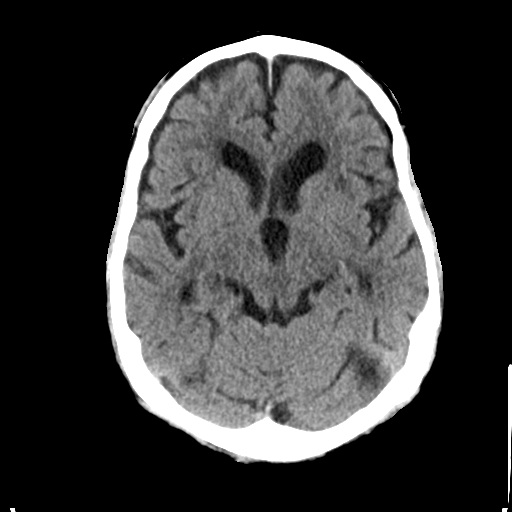
[im 16/30  brain]
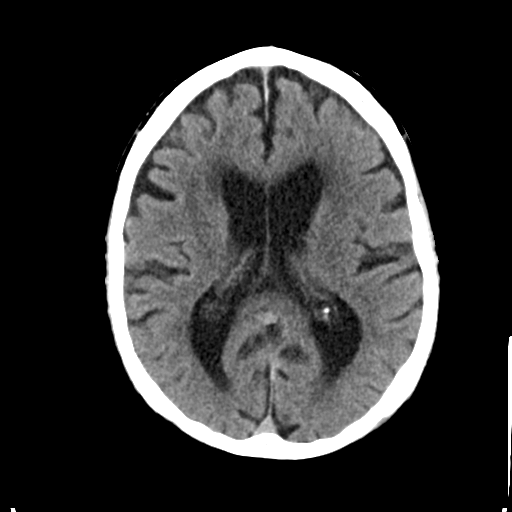
[im 16/30  bone]
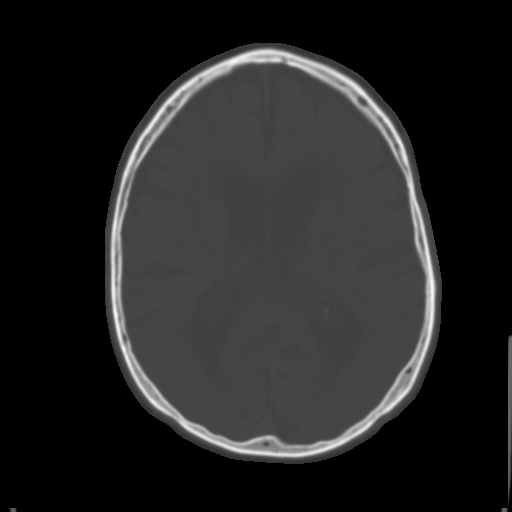
[im 19/30  brain]
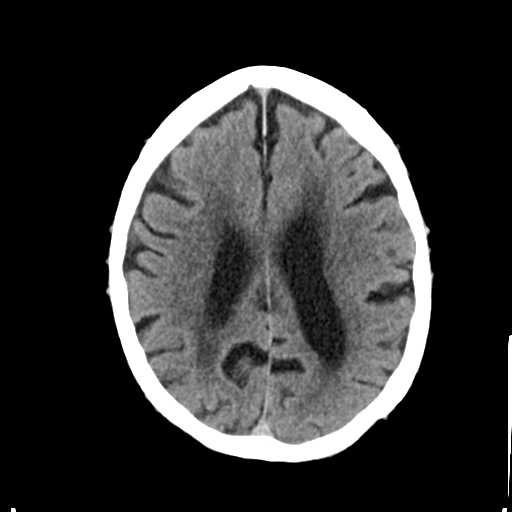
[im 22/30  brain]
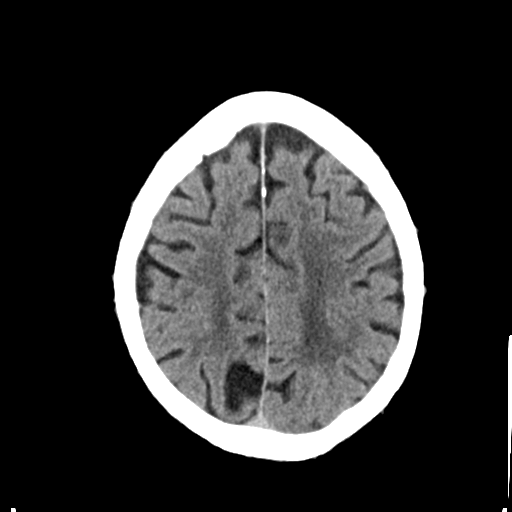
[im 25/30  brain]
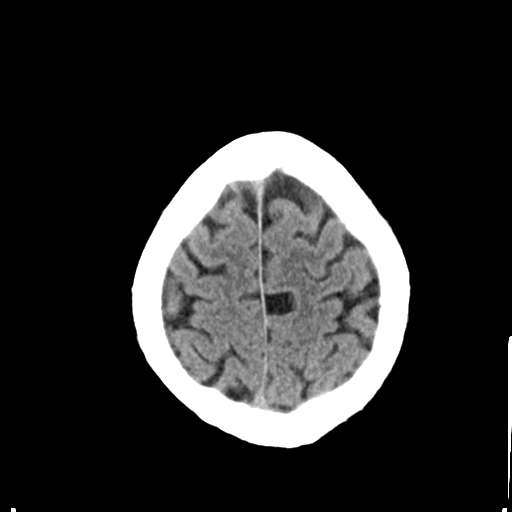
[im 28/30  brain]
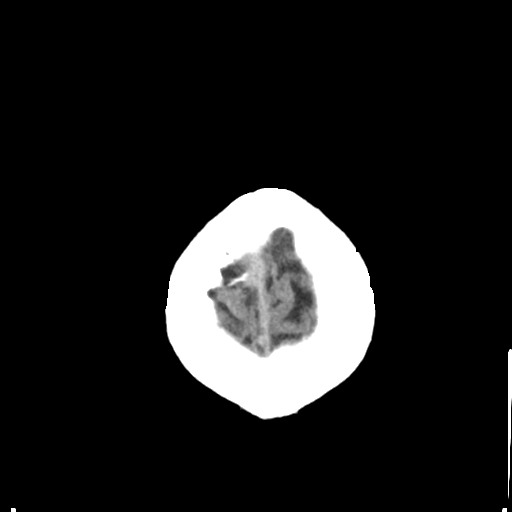
[im 28/30  bone]
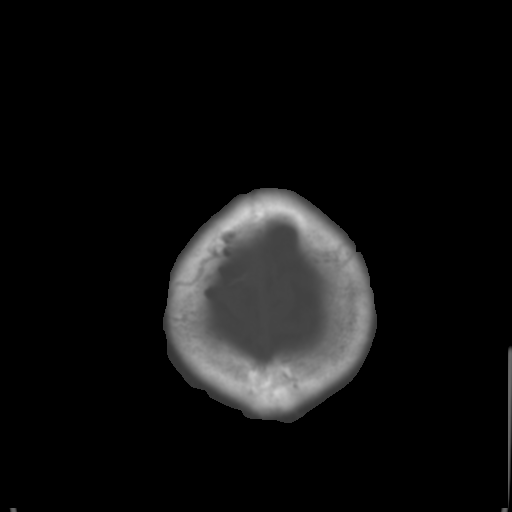

[Series 4: coronal soft tissue · coronal · 0.30mm/px · 3 of 67 slices shown]
[im 23/67  brain]
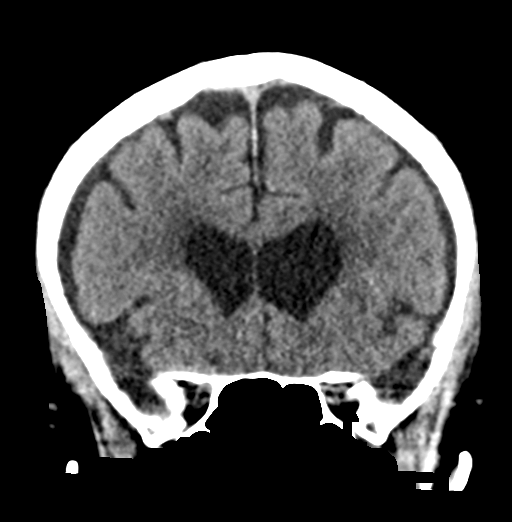
[im 30/67  brain]
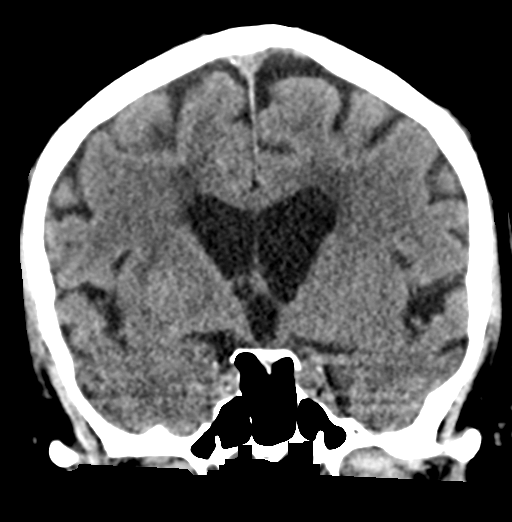
[im 37/67  brain]
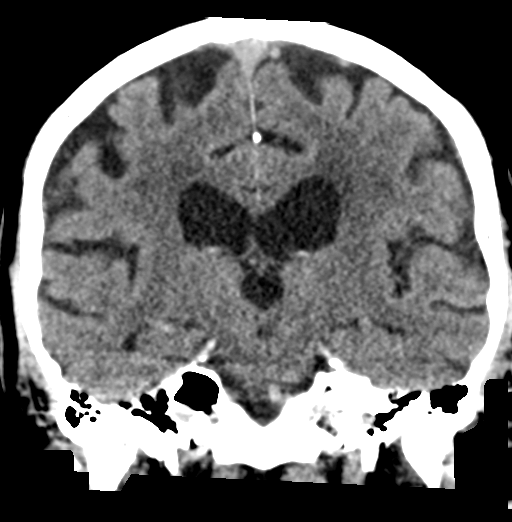

[Series 5: sagittal soft tissue · sagittal · 0.33mm/px · 3 of 56 slices shown]
[im 19/56  brain]
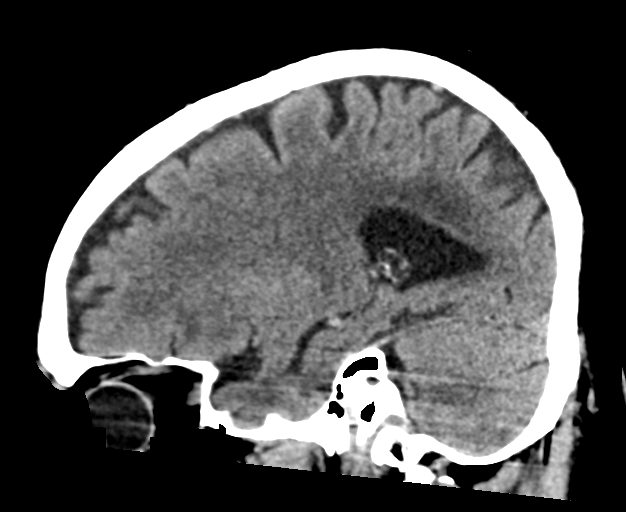
[im 28/56  brain]
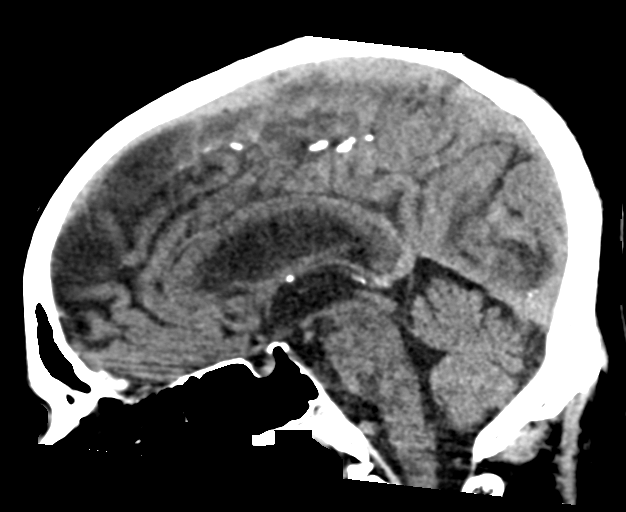
[im 37/56  brain]
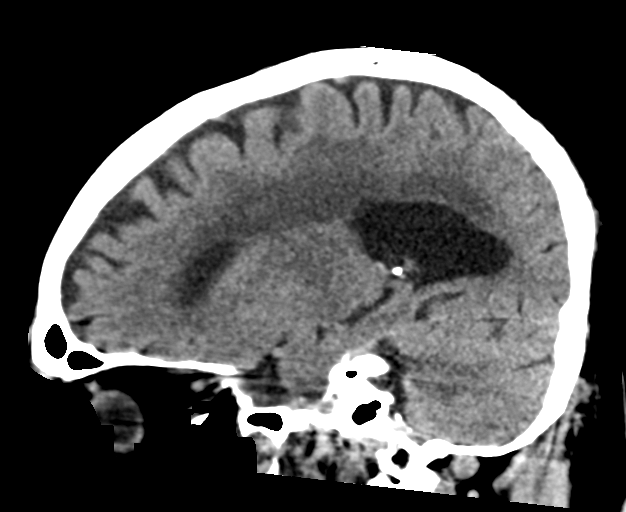

[15 of 47 positions shown; findings below may reference images not displayed]

FINDINGS: BRAIN: No intraparenchymal hemorrhage, mass effect nor midline
shift. Moderate to severe parenchymal brain volume loss. No
hydrocephalus. Patchy supratentorial white matter hypodensities
within normal range for patient's age, though non-specific are most
compatible with chronic small vessel ischemic disease. No acute
large vascular territory infarcts. No abnormal extra-axial fluid
collections. Basal cisterns are patent.

VASCULAR: Mild calcific atherosclerosis of the carotid siphons.

SKULL: No skull fracture. RIGHT frontal subcentimeter presumed bone
island. No significant scalp soft tissue swelling.

SINUSES/ORBITS: Trace paranasal sinus mucosal thickening. Mastoid
air cells are well aerated.The included ocular globes and orbital
contents are non-suspicious.

OTHER: None.
IMPRESSION: 1. No acute intracranial process.
2. Borderline parenchymal brain volume loss for age.
3. Moderate chronic small vessel ischemic changes.

## 2019-06-07 DIAGNOSIS — H2512 Age-related nuclear cataract, left eye: Secondary | ICD-10-CM | POA: Diagnosis not present

## 2019-06-07 DIAGNOSIS — H43813 Vitreous degeneration, bilateral: Secondary | ICD-10-CM | POA: Diagnosis not present

## 2019-06-07 DIAGNOSIS — H401132 Primary open-angle glaucoma, bilateral, moderate stage: Secondary | ICD-10-CM | POA: Diagnosis not present

## 2019-06-07 DIAGNOSIS — Z961 Presence of intraocular lens: Secondary | ICD-10-CM | POA: Diagnosis not present

## 2019-06-07 DIAGNOSIS — H43392 Other vitreous opacities, left eye: Secondary | ICD-10-CM | POA: Diagnosis not present

## 2019-06-26 ENCOUNTER — Other Ambulatory Visit: Payer: Self-pay | Admitting: Family Medicine

## 2019-06-26 DIAGNOSIS — F411 Generalized anxiety disorder: Secondary | ICD-10-CM

## 2019-06-26 NOTE — Telephone Encounter (Signed)
Patient has requested this medication, only this medication go to the mail order pharmacy listed on his chart which is [facility]. I will go ahead and process this request as the previous order has not been picked up.

## 2019-07-20 ENCOUNTER — Encounter: Payer: Self-pay | Admitting: Family Medicine

## 2019-07-20 ENCOUNTER — Other Ambulatory Visit: Payer: Self-pay

## 2019-07-20 ENCOUNTER — Ambulatory Visit (INDEPENDENT_AMBULATORY_CARE_PROVIDER_SITE_OTHER): Payer: Medicare HMO | Admitting: Family Medicine

## 2019-07-20 VITALS — BP 127/73 | HR 67 | Temp 97.7°F | Ht 69.0 in | Wt 181.0 lb

## 2019-07-20 DIAGNOSIS — R0609 Other forms of dyspnea: Secondary | ICD-10-CM

## 2019-07-20 DIAGNOSIS — R0600 Dyspnea, unspecified: Secondary | ICD-10-CM | POA: Diagnosis not present

## 2019-07-20 DIAGNOSIS — E785 Hyperlipidemia, unspecified: Secondary | ICD-10-CM

## 2019-07-20 DIAGNOSIS — F418 Other specified anxiety disorders: Secondary | ICD-10-CM

## 2019-07-20 DIAGNOSIS — R69 Illness, unspecified: Secondary | ICD-10-CM | POA: Diagnosis not present

## 2019-07-20 DIAGNOSIS — I10 Essential (primary) hypertension: Secondary | ICD-10-CM

## 2019-07-20 DIAGNOSIS — E039 Hypothyroidism, unspecified: Secondary | ICD-10-CM

## 2019-07-20 NOTE — Progress Notes (Signed)
Subjective:  Patient ID: [name], male    DOB: [birth year]  Age: 79 y.o. MRN: [id number]  CC:  Chief Complaint  Patient presents with  . Anxiety    pt states last visit he had a lot goin on with a resent death in the family and the passing of his dog. pt states he feels better now pt states " time has a way of healing you whith stuff like that.". pt states he has no new complaints as of today.    HPI [name] presents for   Hypertension: History of labile hypertension, treated by Dr.Nwobu at [facility]. Every 6 months.  Taking doxazosin 8 mg once per day, furosemide 20 mg daily, labetalol 100 mg BID, losartan 100 mg daily, amlodipine 2.5 mg,  Short of breath with stairs at times past few years, some dyspnea with long walking over 100 yds. No orthopnea/PND.  No chest pain.  No prior stress testing.  Home readings: 120/43?  Usually in 120's.  BP Readings from Last 3 Encounters:  07/20/19 127/73  04/20/19 100/60  09/15/18 125/64   Lab Results  Component Value Date   CREATININE 1.09 04/20/2019   Hypothyroidism: Synthroid 50 mcg daily. Taking medication daily.  No new hot or cold intolerance. No new hair or skin changes - usually dry skin past 1-2 yrs. Heart palpitations or new fatigue. No new weight changes.   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  Phone numbers were provided for counseling as well as handout on adjustment disorder.  He was continued on max dose of Lexapro 20 mg daily.  Feels like things have improved with time. Did not meet with counselor. Feels like back to normal now. Doing well on lexapro - no new side effects.  Depression screen [facility] 2/9 07/20/2019 04/20/2019 10/17/2018 09/15/2018 06/17/2018  Decreased Interest 0 0 0 0 0  Down, Depressed, Hopeless 0 0 0 0 0  PHQ - 2 Score 0 0 0 0 0     Hyperlipidemia: Simvastatin 20 mg daily, no new muscle aches.  Lab Results  Component Value Date   CHOL 123 04/20/2019   HDL 65 04/20/2019   LDLCALC 38 04/20/2019   TRIG 110 04/20/2019   CHOLHDL 1.9 04/20/2019   Lab Results  Component Value Date   ALT 11 04/20/2019   AST 15 04/20/2019   ALKPHOS 78 04/20/2019   BILITOT 0.5 04/20/2019    GERD Prilosec 20 mg every other day. Controlling symptoms.    History Patient Active Problem List   Diagnosis Date Noted  . Lumbar radiculopathy 01/11/2018  . Transient alteration of awareness 01/06/2018  . Abnormal head CT 01/06/2018  . Hypertension 09/18/2016  . Benign essential hypertension 11/18/2015  . Acquired hypothyroidism 08/30/2015  . 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 Hypercholesteremia 08/30/2015  . 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 Anxiety   . Arthritis   . BPH (benign prostatic hyperplasia)   . Bruises easily   . Cancer (HCC)    throat  . Complication of anesthesia    difficult urination after anesthesia  . Dizziness   . GERD (gastroesophageal reflux disease)   . Glaucoma    left worse than right  . H/O hiatal hernia   . Heart murmur   .  Hyperlipemia   . Hypertension    takes meds daily  . Hypothyroidism   . Transient alteration of awareness    ~ 12/27/17, evaluated by neurologist Dr. Felecia Shelling 01/06/18  . Urinary retention    Past Surgical History:  Procedure Laterality Date  . ANTERIOR LAT LUMBAR FUSION  03/11/2012   Procedure: ANTERIOR LATERAL LUMBAR FUSION 1 LEVEL;  Surgeon: Charlie Pitter, MD;  Location: [facility];  Service: Neurosurgery;  Laterality: Left;  Left lumbar four-five extreme lumbar interbody fusion with percutaneous pedicle screws   . BACK SURGERY     ruptured disc repair  . COLONOSCOPY W/ POLYPECTOMY    . EYE SURGERY     left cataract  . HERNIA REPAIR    . INGUINAL HERNIA REPAIR Right 05/22/2014   Procedure: REPAIR  RECURRENT RIGHT INGUINAL HERNIA;  Surgeon: Doreen Salvage, MD;  Location: [facility];  Service: General;  Laterality: Right;  . INSERTION OF MESH Right 05/22/2014   Procedure: INSERTION OF MESH;  Surgeon: Doreen Salvage, MD;  Location: [facility];  Service: General;  Laterality: Right;  . LUMBAR LAMINECTOMY/DECOMPRESSION MICRODISCECTOMY Left 01/11/2018   Procedure: Laminectomy and Foraminotomy - Left - Lumbar Three-Lumbar Four - Lumbar Five-Sacral One;  Surgeon: Earnie Larsson, MD;  Location: [facility];  Service: Neurosurgery;  Laterality: Left;  Laminectomy and Foraminotomy - Left - Lumbar Three-Lumbar Four - Lumbar Five-Sacral One  . LUMBAR PERCUTANEOUS PEDICLE SCREW 1 LEVEL  03/11/2012   Procedure: LUMBAR PERCUTANEOUS PEDICLE SCREW 1 LEVEL;  Surgeon: Charlie Pitter, MD;  Location: [facility];  Service: Neurosurgery;  Laterality: Left;  Left lumbar four-five extreme lumbar interbody fusion with percutaneous pedicle screws   . RADICAL NECK DISSECTION  1989  . ROTATOR CUFF REPAIR     right  . TONSILLECTOMY     Allergies  Allergen Reactions  . Codeine Nausea Only   Prior to Admission medications   Medication Sig Start Date End Date Taking? Authorizing Provider  amLODipine (NORVASC) 2.5 MG tablet Take 1 tablet (2.5 mg total) by mouth daily. 04/20/19  Yes [name], MD  aspirin 81 MG tablet Take 81 mg by mouth at bedtime.   Yes [provider]  dorzolamide-timolol (COSOPT) 22.3-6.8 MG/ML ophthalmic solution Place 1 drop into both eyes 2 (two) times daily.   Yes [provider]  doxazosin (CARDURA) 8 MG tablet Take 1 tablet (8 mg total) by mouth daily. 04/20/19  Yes [name], MD  escitalopram (LEXAPRO) 20 MG tablet TAKE 1 TABLET (20 MG TOTAL) BY MOUTH DAILY. 06/26/19  Yes [name], MD  fluocinonide cream (LIDEX) [id number] % Apply 1 application topically 2 (two) times daily. 03/09/18  Yes [name], MD  fluticasone (FLONASE) 50 MCG/ACT nasal spray Place 1 spray into both nostrils  daily. Patient taking differently: Place 1 spray into both nostrils daily as needed for allergies.  09/02/17  Yes [name], MD  furosemide (LASIX) 20 MG tablet Take 1 tablet (20 mg total) by mouth daily. 04/20/19  Yes [name], MD  ipratropium (ATROVENT) 0.03 % nasal spray Place 1 spray into both nostrils 2 (two) times daily as needed for rhinitis. 09/02/17  Yes [name], MD  labetalol (NORMODYNE) 100 MG tablet Take 1 tablet (100 mg total) by mouth 2 (two) times daily. 04/20/19  Yes [name], MD  levothyroxine (SYNTHROID) 50 MCG tablet Take 1 tablet (50 mcg total) by mouth daily. 04/20/19  Yes [name], MD  losartan (COZAAR) 100 MG tablet Take 1 tablet (100 mg total) by mouth daily. 04/20/19  Yes [name], MD  omeprazole (PRILOSEC) 20 MG capsule Take 1 capsule (20 mg total) by mouth every other day. IN THE MORNING 04/20/19  Yes [name], MD  simvastatin (ZOCOR) 20 MG tablet Take 1 tablet (20 mg total) by mouth every evening. 04/20/19  Yes [name], MD  Tafluprost 0.0015 % SOLN Place 1 drop into both eyes at bedtime. ZIOPTAN (tafluprost) 09/15/16  Yes [provider]   Social History   Socioeconomic History  . Marital status: Married    Spouse name: Not on file  . Number of children: Not on file  . Years of education: Not on file  . Highest education level: Not on file  Occupational History  . Not on file  Tobacco Use  . Smoking status: Former Research scientist (life sciences)  . Smokeless tobacco: Never Used  Substance and Sexual Activity  . Alcohol use: Yes    Comment: 1 or 2 beers a week  . Drug use: No  . Sexual activity: Not Currently  Other Topics Concern  . Not on file  Social History Narrative  . Not on file   Social Determinants of Health   Financial Resource Strain:   . Difficulty of Paying Living Expenses:   Food Insecurity:   . Worried About Charity fundraiser in the Last Year:   . Arboriculturist in the Last Year:     Transportation Needs:   . Film/video editor (Medical):   Marland Kitchen Lack of Transportation (Non-Medical):   Physical Activity:   . Days of Exercise per Week:   . Minutes of Exercise per Session:   Stress:   . Feeling of Stress :   Social Connections:   . Frequency of Communication with Friends and Family:   . Frequency of Social Gatherings with Friends and Family:   . Attends Religious Services:   . Active Member of Clubs or Organizations:   . Attends Archivist Meetings:   Marland Kitchen Marital Status:   Intimate Partner Violence:   . Fear of Current or Ex-Partner:   . Emotionally Abused:   Marland Kitchen Physically Abused:   . Sexually Abused:     Review of Systems  Constitutional: Negative for fatigue and unexpected weight change.  Eyes: Negative for visual disturbance.  Respiratory: Positive for shortness of breath (with exertion). Negative for cough and chest tightness.   Cardiovascular: Negative for chest pain, palpitations and leg swelling.  Gastrointestinal: Negative for abdominal pain and blood in stool.  Neurological: Negative for dizziness, light-headedness and headaches.  Psychiatric/Behavioral: Negative for sleep disturbance. The patient is not nervous/anxious.      Objective:   Vitals:   07/20/19 0956  BP: 127/73  Pulse: 67  Temp: 97.7 F (36.5 C)  TempSrc: Temporal  SpO2: 97%  Weight: 181 lb (82.1 kg)  Height: 5\' 9"  (1.753 m)     Physical Exam Vitals reviewed.  Constitutional:      Appearance: He is well-developed.  HENT:     Head: Normocephalic and atraumatic.  Eyes:     Pupils: Pupils are equal, round, and reactive to light.  Neck:     Vascular: No carotid bruit or JVD.     Comments: No jvd, no nodules.  Cardiovascular:     Rate and Rhythm: Normal rate and regular rhythm.     Heart sounds: Normal heart sounds. No murmur.  Pulmonary:  Effort: Pulmonary effort is normal. No respiratory distress.     Breath sounds: Normal breath sounds. No stridor. No  wheezing, rhonchi or rales.  Musculoskeletal:     Cervical back: No tenderness.     Right lower leg: No edema.     Left lower leg: No edema.  Skin:    General: Skin is warm and dry.  Neurological:     Mental Status: He is alert and oriented to person, place, and time.    EKG sinus bradycardia rate 58, no apparent acute ST/T wave changes.  Assessment & Plan:  [name] is a 79 y.o. male . Hypothyroidism, unspecified type - Plan: TSH + free T4  -Borderline TSH previously.  Recheck with free T4.  Potential symptoms above.  Option of slight increased dose or intermittent extra half dose few days per week if still elevated TSH.  Essential hypertension - Plan: EKG 12-Lead, Ambulatory referral to Cardiology DOE (dyspnea on exertion) - Plan: EKG 12-Lead, Pro b natriuretic peptide, Ambulatory referral to Cardiology  -Stable BP.  Continue same regimen  No concerning findings on EKG but will refer to cardiology to decide on stress testing with longstanding dyspnea on exertion. Check BNP.  RTC precautions/ER precautions  Hyperlipidemia, unspecified hyperlipidemia type - Plan: Ambulatory referral to Cardiology  - as above. tolerating simvastatin - continue same.   Depression with anxiety  -Improving from previous life stressors/adjustment disorder.  Continue Lexapro same dose.  No orders of the defined types were placed in this encounter.  Patient Instructions    I will check thyroid test to determine if any changes needed.  No other medication changes for now.  I will refer you to cardiology to discuss the shortness of breath with exertion.  I did check a lab test as well. Return to the clinic or go to the nearest emergency room if any of your symptoms worsen or new symptoms occur.    If you have lab work done today you will be contacted with your lab results within the next 2 weeks.  If you have not heard from [place] then please contact us. The fastest way to get your results is to  register for My Chart.   IF you received an x-ray today, you will receive an invoice from [facility]. Please contact [facility] at [phone] with questions or concerns regarding your invoice.   IF you received labwork today, you will receive an invoice from [facility]. Please contact [facility] at [phone] with questions or concerns regarding your invoice.   Our billing staff will not be able to assist you with questions regarding bills from these companies.  You will be contacted with the lab results as soon as they are available. The fastest way to get your results is to activate your My Chart account. Instructions are located on the last page of this paperwork. If you have not heard from [place] regarding the results in 2 weeks, please contact this office.         Signed, Merri Ray, MD [facility]

## 2019-07-20 NOTE — Patient Instructions (Addendum)
  I will check thyroid test to determine if any changes needed.  No other medication changes for now.  I will refer you to cardiology to discuss the shortness of breath with exertion.  I did check a lab test as well. Return to the clinic or go to the nearest emergency room if any of your symptoms worsen or new symptoms occur.    If you have lab work done today you will be contacted with your lab results within the next 2 weeks.  If you have not heard from [place] then please contact us. The fastest way to get your results is to register for My Chart.   IF you received an x-ray today, you will receive an invoice from [facility]. Please contact [facility] at [phone] with questions or concerns regarding your invoice.   IF you received labwork today, you will receive an invoice from [facility]. Please contact [facility] at [phone] with questions or concerns regarding your invoice.   Our billing staff will not be able to assist you with questions regarding bills from these companies.  You will be contacted with the lab results as soon as they are available. The fastest way to get your results is to activate your My Chart account. Instructions are located on the last page of this paperwork. If you have not heard from [place] regarding the results in 2 weeks, please contact this office.

## 2019-07-21 LAB — TSH+FREE T4
Free T4: 0.88 ng/dL (ref 0.82–1.77)
TSH: 5.25 u[IU]/mL — ABNORMAL HIGH (ref 0.450–4.500)

## 2019-07-21 LAB — PRO B NATRIURETIC PEPTIDE: NT-Pro BNP: 359 pg/mL (ref 0–486)

## 2019-08-10 ENCOUNTER — Other Ambulatory Visit: Payer: Self-pay

## 2019-08-10 ENCOUNTER — Ambulatory Visit: Payer: Medicare HMO | Admitting: Internal Medicine

## 2019-08-10 ENCOUNTER — Encounter: Payer: Self-pay | Admitting: Internal Medicine

## 2019-08-10 VITALS — BP 114/76 | HR 70 | Ht 69.0 in | Wt 181.2 lb

## 2019-08-10 DIAGNOSIS — I10 Essential (primary) hypertension: Secondary | ICD-10-CM | POA: Diagnosis not present

## 2019-08-10 DIAGNOSIS — E782 Mixed hyperlipidemia: Secondary | ICD-10-CM

## 2019-08-10 DIAGNOSIS — R0600 Dyspnea, unspecified: Secondary | ICD-10-CM

## 2019-08-10 DIAGNOSIS — R0609 Other forms of dyspnea: Secondary | ICD-10-CM

## 2019-08-10 NOTE — Progress Notes (Signed)
OFFICE CONSULT NOTE  Chief Complaint:  Shortness of breath  Primary Care Physician: [name], MD  HPI:  [name] is a 79 y.o. male who is being seen today for the evaluation of shortness of breath at the request of [name], MD.  This is a pleasant 79 year old male kindly referred for evaluation of shortness of breath.  He notes longstanding nasal congestion in which she has been followed by ENT.  He is used a number of different treatments for this however was felt not to be as good surgery candidate.  He says the shortness of breath he thinks is primarily due to nasal congestion and not being able to breathe through his mouth.  He notes when he does certain activities he becomes more short of breath.  He denies any chest pain or any significant decline in exertional activity.  Although he struggles with back pain, he noted he was recently able to shovel many yards of mulch and move that with a wheelbarrow as well as lift large bags of feed.  He has a history of dyslipidemia however well controlled.  Total cholesterol 123, triglycerides 110, HDL 65 and LDL 38 on simvastatin.  There is hypertension but that is also well controlled today 114/66.  EKG shows a sinus rhythm with first-degree AV block and borderline prolonged QT but no ischemia.  PMHx:  Past Medical History:  Diagnosis Date  . Anxiety   . Arthritis   . BPH (benign prostatic hyperplasia)   . Bruises easily   . Cancer (HCC)    throat  . Complication of anesthesia    difficult urination after anesthesia  . Dizziness   . GERD (gastroesophageal reflux disease)   . Glaucoma    left worse than right  . H/O hiatal hernia   . Heart murmur   . Hyperlipemia   . Hypertension    takes meds daily  . Hypothyroidism   . Transient alteration of awareness    ~ 12/27/17, evaluated by neurologist Dr. Felecia Shelling 01/06/18  . Urinary retention     Past Surgical History:  Procedure Laterality Date  . ANTERIOR LAT  LUMBAR FUSION  03/11/2012   Procedure: ANTERIOR LATERAL LUMBAR FUSION 1 LEVEL;  Surgeon: Charlie Pitter, MD;  Location: [facility];  Service: Neurosurgery;  Laterality: Left;  Left lumbar four-five extreme lumbar interbody fusion with percutaneous pedicle screws   . BACK SURGERY     ruptured disc repair  . COLONOSCOPY W/ POLYPECTOMY    . EYE SURGERY     left cataract  . HERNIA REPAIR    . INGUINAL HERNIA REPAIR Right 05/22/2014   Procedure: REPAIR RECURRENT RIGHT INGUINAL HERNIA;  Surgeon: Doreen Salvage, MD;  Location: [place];  Service: General;  Laterality: Right;  . INSERTION OF MESH Right 05/22/2014   Procedure: INSERTION OF MESH;  Surgeon: Doreen Salvage, MD;  Location: [place];  Service: General;  Laterality: Right;  . LUMBAR LAMINECTOMY/DECOMPRESSION MICRODISCECTOMY Left 01/11/2018   Procedure: Laminectomy and Foraminotomy - Left - Lumbar Three-Lumbar Four - Lumbar Five-Sacral One;  Surgeon: Earnie Larsson, MD;  Location: [facility];  Service: Neurosurgery;  Laterality: Left;  Laminectomy and Foraminotomy - Left - Lumbar Three-Lumbar Four - Lumbar Five-Sacral One  . LUMBAR PERCUTANEOUS PEDICLE SCREW 1 LEVEL  03/11/2012   Procedure: LUMBAR PERCUTANEOUS PEDICLE SCREW 1 LEVEL;  Surgeon: Charlie Pitter, MD;  Location: [facility];  Service: Neurosurgery;  Laterality: Left;  Left lumbar four-five extreme lumbar  interbody fusion with percutaneous pedicle screws   . RADICAL NECK DISSECTION  1989  . ROTATOR CUFF REPAIR     right  . TONSILLECTOMY      FAMHx:  No family history on file.  No significant early onset coronary disease in the family.  SOCHx:   reports that he has quit smoking. He has never used smokeless tobacco. He reports current alcohol use. He reports that he does not use drugs.  ALLERGIES:  Allergies  Allergen Reactions  . Codeine Nausea Only    ROS: Pertinent items noted in HPI and remainder of comprehensive ROS otherwise negative.  HOME MEDS: Current Outpatient Medications on File Prior  to Visit  Medication Sig Dispense Refill  . amLODipine (NORVASC) 2.5 MG tablet Take 1 tablet (2.5 mg total) by mouth daily. 90 tablet 1  . aspirin 81 MG tablet Take 81 mg by mouth at bedtime.    . dorzolamide-timolol (COSOPT) 22.3-6.8 MG/ML ophthalmic solution Place 1 drop into both eyes 2 (two) times daily.    Marland Kitchen doxazosin (CARDURA) 8 MG tablet Take 1 tablet (8 mg total) by mouth daily. 90 tablet 1  . escitalopram (LEXAPRO) 20 MG tablet TAKE 1 TABLET (20 MG TOTAL) BY MOUTH DAILY. 180 tablet 0  . fluocinonide cream (LIDEX) [id number] % Apply 1 application topically 2 (two) times daily. 30 g 0  . fluticasone (FLONASE) 50 MCG/ACT nasal spray Place 1 spray into both nostrils daily. (Patient taking differently: Place 1 spray into both nostrils daily as needed for allergies. ) 16 g 6  . furosemide (LASIX) 20 MG tablet Take 1 tablet (20 mg total) by mouth daily. 90 tablet 1  . ipratropium (ATROVENT) 0.03 % nasal spray Place 1 spray into both nostrils 2 (two) times daily as needed for rhinitis. 30 mL 5  . labetalol (NORMODYNE) 100 MG tablet Take 1 tablet (100 mg total) by mouth 2 (two) times daily. 90 tablet 1  . levothyroxine (SYNTHROID) 50 MCG tablet Take 1 tablet (50 mcg total) by mouth daily. 90 tablet 1  . losartan (COZAAR) 100 MG tablet Take 1 tablet (100 mg total) by mouth daily. 90 tablet 1  . omeprazole (PRILOSEC) 20 MG capsule Take 1 capsule (20 mg total) by mouth every other day. IN THE MORNING 90 capsule 1  . simvastatin (ZOCOR) 20 MG tablet Take 1 tablet (20 mg total) by mouth every evening. 90 tablet 3  . Tafluprost 0.0015 % SOLN Place 1 drop into both eyes at bedtime. ZIOPTAN (tafluprost)     No current facility-administered medications on file prior to visit.    LABS/IMAGING: No results found for this or any previous visit (from the past 48 hour(s)). No results found.  LIPID PANEL:    Component Value Date/Time   CHOL 123 04/20/2019 1216   TRIG 110 04/20/2019 1216   HDL 65 04/20/2019  1216   CHOLHDL 1.9 04/20/2019 1216   LDLCALC 38 04/20/2019 1216    WEIGHTS: Wt Readings from Last 3 Encounters:  08/10/19 181 lb 3.2 oz (82.2 kg)  07/20/19 181 lb (82.1 kg)  04/20/19 184 lb 6.4 oz (83.6 kg)    VITALS: BP 114/76   Pulse 70   Ht 5\' 9"  (1.753 m)   Wt 181 lb 3.2 oz (82.2 kg)   BMI 26.76 kg/m   EXAM: General appearance: alert and no distress Neck: no carotid bruit, no JVD and thyroid not enlarged, symmetric, no tenderness/mass/nodules Lungs: clear to auscultation bilaterally Heart: regular rate and rhythm, S1,  S2 normal, no murmur, click, rub or gallop Abdomen: soft, non-tender; bowel sounds normal; no masses,  no organomegaly Extremities: extremities normal, atraumatic, no cyanosis or edema Pulses: 2+ and symmetric Skin: Skin color, texture, turgor normal. No rashes or lesions Neurologic: Grossly normal Psych: Pleasant  EKG: Normal sinus rhythm at 70 with first-degree AV block, prolonged QTc 481 ms-personally reviewed- personally reviewed  ASSESSMENT: 1. Dyspnea on exertion 2. Hypertension-controlled 3. Dyslipidemia-at goal  PLAN: 1.   Mr. Hoxit reports some dyspnea on exertion which she thinks is primarily due to issues with nasal congestion.  He denies any chest pain and reports no significant fatigue or exercise intolerance doing certain activities.  He was recently able to do quite a bit of exertional work without any difficulty.  His risk factors all seem to be well controlled and there is no significant family history of heart disease.  His age is obviously a risk factor for heart disease however I do not think that his symptoms are likely attributable to coronary disease.  He says that he mentioned his shortness of breath only in questioning by his primary care provider however would not otherwise have been concerned about it.  I would be happy to see him back in follow-up as needed or if his symptoms become progressively worse or if he develops chest  pain at which time we could consider stress testing.  Thanks again for the kind referral.  Pixie Casino, MD, FACC, [place] Director of the [facility] Diplomate of the American Board of Clinical Lipidology Attending Cardiologist  Direct Dial: [phone]  Fax: [phone]  Website:  [email or web address]  Nadean Corwin Taylynn Easton 08/10/2019, 1:27 PM

## 2019-08-10 NOTE — Patient Instructions (Signed)

## 2019-09-12 ENCOUNTER — Telehealth: Payer: Self-pay | Admitting: *Deleted

## 2019-09-12 NOTE — Telephone Encounter (Signed)
Schedule AWV.  

## 2019-10-10 DIAGNOSIS — D485 Neoplasm of uncertain behavior of skin: Secondary | ICD-10-CM | POA: Diagnosis not present

## 2019-10-10 DIAGNOSIS — L820 Inflamed seborrheic keratosis: Secondary | ICD-10-CM | POA: Diagnosis not present

## 2019-10-10 DIAGNOSIS — L570 Actinic keratosis: Secondary | ICD-10-CM | POA: Diagnosis not present

## 2019-10-10 DIAGNOSIS — L821 Other seborrheic keratosis: Secondary | ICD-10-CM | POA: Diagnosis not present

## 2019-10-10 DIAGNOSIS — Z85828 Personal history of other malignant neoplasm of skin: Secondary | ICD-10-CM | POA: Diagnosis not present

## 2019-10-10 DIAGNOSIS — D225 Melanocytic nevi of trunk: Secondary | ICD-10-CM | POA: Diagnosis not present

## 2019-10-10 DIAGNOSIS — D2261 Melanocytic nevi of right upper limb, including shoulder: Secondary | ICD-10-CM | POA: Diagnosis not present

## 2019-10-10 DIAGNOSIS — C44319 Basal cell carcinoma of skin of other parts of face: Secondary | ICD-10-CM | POA: Diagnosis not present

## 2019-10-19 ENCOUNTER — Ambulatory Visit: Payer: Medicare HMO | Admitting: Family Medicine

## 2019-10-19 ENCOUNTER — Other Ambulatory Visit: Payer: Self-pay | Admitting: Family Medicine

## 2019-10-19 NOTE — Telephone Encounter (Signed)
Requested Prescriptions  Pending Prescriptions Disp Refills  . labetalol (NORMODYNE) 100 MG tablet [Pharmacy Med Name: LABETALOL HCL 100 MG TABLET] 60 tablet 0    Sig: TAKE ONE TABLET BY MOUTH TWICE A DAY     Cardiovascular:  Beta Blockers Passed - 10/19/2019  5:18 PM      Passed - Last BP in normal range    BP Readings from Last 1 Encounters:  08/10/19 114/76         Passed - Last Heart Rate in normal range    Pulse Readings from Last 1 Encounters:  08/10/19 70         Passed - Valid encounter within last 6 months    Recent Outpatient Visits          3 months ago Hypothyroidism, unspecified type   [facility], Ranell Patrick, MD   6 months ago Anxiety state   [facility] [place], MD   8 months ago Need for influenza vaccination   [facility], Ranell Patrick, MD   1 year ago Hypothyroidism, unspecified type   [facility], Ranell Patrick, MD   1 year ago Essential hypertension   [facility], Ranell Patrick, MD      Future Appointments            In 1 week Carlota Raspberry Ranell Patrick, MD [facility] [place], [facility]

## 2019-10-20 ENCOUNTER — Ambulatory Visit: Payer: Medicare HMO | Admitting: Family Medicine

## 2019-10-27 ENCOUNTER — Other Ambulatory Visit: Payer: Self-pay

## 2019-10-27 ENCOUNTER — Encounter: Payer: Self-pay | Admitting: Family Medicine

## 2019-10-27 ENCOUNTER — Ambulatory Visit (INDEPENDENT_AMBULATORY_CARE_PROVIDER_SITE_OTHER): Payer: Medicare HMO | Admitting: Family Medicine

## 2019-10-27 VITALS — BP 96/60 | HR 61 | Temp 97.2°F | Ht 69.0 in | Wt 179.0 lb

## 2019-10-27 DIAGNOSIS — I10 Essential (primary) hypertension: Secondary | ICD-10-CM | POA: Diagnosis not present

## 2019-10-27 DIAGNOSIS — E039 Hypothyroidism, unspecified: Secondary | ICD-10-CM

## 2019-10-27 DIAGNOSIS — E785 Hyperlipidemia, unspecified: Secondary | ICD-10-CM | POA: Diagnosis not present

## 2019-10-27 NOTE — Progress Notes (Signed)
Subjective:  Patient ID: [name], male    DOB: 1940/06/23  Age: 79 y.o. MRN: [id number]  CC:  Chief Complaint  Patient presents with  . Hypothyroidism    medication refill / labs     HPI [name] presents for   Hypothyroidism: Lab Results  Component Value Date   TSH 5.370 (H) 10/27/2019  Has continued on Synthroid 50 mcg daily, borderline reading in March,  Free t4 normal.  Taking medication daily.  No new hot or cold intolerance. No new hair or skin changes, heart palpitations or new fatigue. No new weight changes.   Hypertension: Prior history of labile hypertension treated by specialist at [facility]. Next appt in December.  Referred to cardiology March 15 visit due to some dyspnea on exertion.  April 8 visit reviewed.  Not thought to be attributable to coronary disease.  Follow-up as needed. Home readings: 601-093 systolic.  Some lightheadedness with certain activities.  BP Readings from Last 3 Encounters:  10/27/19 96/60  08/10/19 114/76  07/20/19 127/73   Lab Results  Component Value Date   CREATININE 1.17 10/27/2019    Hyperlipidemia: Simvastatin 78m qd. No new myalgias.  Lab Results  Component Value Date   CHOL 127 10/27/2019   HDL 67 10/27/2019   LDLCALC 43 10/27/2019   TRIG 93 10/27/2019   CHOLHDL 1.9 10/27/2019   Lab Results  Component Value Date   ALT 10 10/27/2019   AST 14 10/27/2019   ALKPHOS 77 10/27/2019   BILITOT 0.6 10/27/2019   Depression: With adjustment disorder.  Discussed in March.  Continued on Lexapro 20 mg daily.  Improved at that time. Still doing well.   [facility] 10/27/2019 07/20/2019 04/20/2019 10/17/2018 09/15/2018  Decreased Interest 0 0 0 0 0  Down, Depressed, Hopeless 0 0 0 0 0  PHQ - 2 Score 0 0 0 0 0     History Patient Active Problem List   Diagnosis Date Noted  . Lumbar radiculopathy 01/11/2018  . Transient alteration of awareness 01/06/2018  . Abnormal head CT  01/06/2018  . Hypertension 09/18/2016  . Benign essential hypertension 11/18/2015  . Acquired hypothyroidism 08/30/2015  . 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 Hypercholesteremia 08/30/2015  . 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 Anxiety   . Arthritis   . BPH (benign prostatic hyperplasia)   . Bruises easily   . Cancer (HCC)    throat  . Complication of anesthesia    difficult urination after anesthesia  . Dizziness   . GERD (gastroesophageal reflux disease)   . Glaucoma    left worse than right  . H/O hiatal hernia   . Heart murmur   . Hyperlipemia   . Hypertension    takes meds daily  . Hypothyroidism   . Transient alteration of awareness    ~ 12/27/17, evaluated by neurologist Dr. SFelecia Shelling9/5/19  . Urinary retention    Past Surgical History:  Procedure Laterality Date  . ANTERIOR LAT LUMBAR FUSION  03/11/2012   Procedure: ANTERIOR LATERAL LUMBAR FUSION 1 LEVEL;  Surgeon: HCharlie Pitter MD;  [facility];  Service: Neurosurgery;  Laterality: Left;  Left lumbar four-five extreme lumbar interbody fusion with percutaneous pedicle screws   . BACK SURGERY     ruptured disc repair  . COLONOSCOPY W/ POLYPECTOMY    . EYE SURGERY     left cataract  .  HERNIA REPAIR    . INGUINAL HERNIA REPAIR Right 05/22/2014   Procedure: REPAIR RECURRENT RIGHT INGUINAL HERNIA;  Surgeon: Doreen Salvage, MD;  Location: [facility];  Service: General;  Laterality: Right;  . INSERTION OF MESH Right 05/22/2014   Procedure: INSERTION OF MESH;  Surgeon: Doreen Salvage, MD;  Location: [facility];  Service: General;  Laterality: Right;  . LUMBAR LAMINECTOMY/DECOMPRESSION MICRODISCECTOMY Left 01/11/2018   Procedure: Laminectomy and Foraminotomy - Left - Lumbar Three-Lumbar Four - Lumbar Five-Sacral One;  Surgeon: Earnie Larsson, MD;  Location: [facility];  Service: Neurosurgery;  Laterality: Left;  Laminectomy and Foraminotomy - Left - Lumbar Three-Lumbar Four -  Lumbar Five-Sacral One  . LUMBAR PERCUTANEOUS PEDICLE SCREW 1 LEVEL  03/11/2012   Procedure: LUMBAR PERCUTANEOUS PEDICLE SCREW 1 LEVEL;  Surgeon: Charlie Pitter, MD;  Location: [facility];  Service: Neurosurgery;  Laterality: Left;  Left lumbar four-five extreme lumbar interbody fusion with percutaneous pedicle screws   . RADICAL NECK DISSECTION  1989  . ROTATOR CUFF REPAIR     right  . TONSILLECTOMY     Allergies  Allergen Reactions  . Codeine Nausea Only   Prior to Admission medications   Medication Sig Start Date End Date Taking? Authorizing Provider  amLODipine (NORVASC) 2.5 MG tablet Take 1 tablet (2.5 mg total) by mouth daily. 04/20/19  Yes [name], MD  aspirin 81 MG tablet Take 81 mg by mouth at bedtime.   Yes [provider]  dorzolamide-timolol (COSOPT) 22.3-6.8 MG/ML ophthalmic solution Place 1 drop into both eyes 2 (two) times daily.   Yes [provider]  doxazosin (CARDURA) 8 MG tablet Take 1 tablet (8 mg total) by mouth daily. 04/20/19  Yes [name], MD  escitalopram (LEXAPRO) 20 MG tablet TAKE 1 TABLET (20 MG TOTAL) BY MOUTH DAILY. 06/26/19  Yes [name], MD  fluocinonide cream (LIDEX) 2.13 % Apply 1 application topically 2 (two) times daily. 03/09/18  Yes [name], MD  fluticasone (FLONASE) 50 MCG/ACT nasal spray Place 1 spray into both nostrils daily. Patient taking differently: Place 1 spray into both nostrils daily as needed for allergies.  09/02/17  Yes [name], MD  furosemide (LASIX) 20 MG tablet Take 1 tablet (20 mg total) by mouth daily. 04/20/19  Yes [name], MD  ipratropium (ATROVENT) 0.03 % nasal spray Place 1 spray into both nostrils 2 (two) times daily as needed for rhinitis. 09/02/17  Yes [name], MD  labetalol (NORMODYNE) 100 MG tablet TAKE ONE TABLET BY MOUTH TWICE A DAY 10/19/19  Yes [name], MD  levothyroxine (SYNTHROID) 50 MCG tablet Take 1 tablet (50 mcg total) by mouth  daily. 04/20/19  Yes [name], MD  losartan (COZAAR) 100 MG tablet Take 1 tablet (100 mg total) by mouth daily. 04/20/19  Yes [name], MD  omeprazole (PRILOSEC) 20 MG capsule Take 1 capsule (20 mg total) by mouth every other day. IN THE MORNING 04/20/19  Yes [name], MD  simvastatin (ZOCOR) 20 MG tablet Take 1 tablet (20 mg total) by mouth every evening. 04/20/19  Yes [name], MD  Tafluprost 0.0015 % SOLN Place 1 drop into both eyes at bedtime. ZIOPTAN (tafluprost) 09/15/16  Yes [provider]   Social History   Socioeconomic History  . Marital status: Married    Spouse name: Not on file  . Number of children: Not on file  . Years of education: Not  on file  . Highest education level: Not on file  Occupational History  . Not on file  Tobacco Use  . Smoking status: Former Research scientist (life sciences)  . Smokeless tobacco: Never Used  Vaping Use  . Vaping Use: Never used  Substance and Sexual Activity  . Alcohol use: Yes    Comment: 1 or 2 beers a week  . Drug use: No  . Sexual activity: Not Currently  Other Topics Concern  . Not on file  Social History Narrative  . Not on file   Social Determinants of Health   Financial Resource Strain:   . Difficulty of Paying Living Expenses:   Food Insecurity:   . Worried About Charity fundraiser in the Last Year:   . Arboriculturist in the Last Year:   Transportation Needs:   . Film/video editor (Medical):   Marland Kitchen Lack of Transportation (Non-Medical):   Physical Activity:   . Days of Exercise per Week:   . Minutes of Exercise per Session:   Stress:   . Feeling of Stress :   Social Connections:   . Frequency of Communication with Friends and Family:   . Frequency of Social Gatherings with Friends and Family:   . Attends Religious Services:   . Active Member of Clubs or Organizations:   . Attends Archivist Meetings:   Marland Kitchen Marital Status:   Intimate Partner Violence:   . Fear of Current or  Ex-Partner:   . Emotionally Abused:   Marland Kitchen Physically Abused:   . Sexually Abused:     Review of Systems  Constitutional: Negative for fatigue and unexpected weight change.  Eyes: Negative for visual disturbance.  Respiratory: Negative for cough, chest tightness and shortness of breath.   Cardiovascular: Negative for chest pain, palpitations and leg swelling.  Gastrointestinal: Negative for abdominal pain and blood in stool.  Neurological: Positive for light-headedness (as above only. ). Negative for dizziness and headaches.  Psychiatric/Behavioral: Negative for dysphoric mood. The patient is not nervous/anxious.      Objective:   Vitals:   10/27/19 0935  BP: 96/60  Pulse: 61  Temp: (!) 97.2 F (36.2 C)  SpO2: 97%  Weight: 179 lb (81.2 kg)  Height: _0  (1.753 m)     Physical Exam Vitals reviewed.  Constitutional:      Appearance: He is well-developed.  HENT:     Head: Normocephalic and atraumatic.  Eyes:     Pupils: Pupils are equal, round, and reactive to light.  Neck:     Vascular: No carotid bruit or JVD.  Cardiovascular:     Rate and Rhythm: Normal rate and regular rhythm.     Heart sounds: Normal heart sounds. No murmur heard.   Pulmonary:     Effort: Pulmonary effort is normal.     Breath sounds: Normal breath sounds. No rales.  Musculoskeletal:     Cervical back: Neck supple. No tenderness.  Skin:    General: Skin is warm and dry.  Neurological:     Mental Status: He is alert and oriented to person, place, and time.        Assessment & Plan:  [name] is a 79 y.o. male . Hypothyroidism, unspecified type - Plan: TSH + free T4  -Borderline TSH prior, asymptomatic.  Check labs.  Continue same regimen for now.   Hyperlipidemia, unspecified hyperlipidemia type - Plan: CMP14+EGFR, Lipid panel  -Tolerating Zocor, continue same, check labs  Essential hypertension  -Blood  pressure low in the office and with some low home readings will  decrease labetalol to 50 mg twice daily, and advised him to stay to his specialist regarding these changes.  RTC precautions.  No orders of the defined types were placed in this encounter.  Patient Instructions     Blood pressure low today and with some low home readings, recommend decreasing labetalol to 1/2 pill (33m) twice per day for now and call Dr. NNeta Ehlersto advise of this change and your low readings.   I will check thyroid test again today.   Return to the clinic or go to the nearest emergency room if any of your symptoms worsen or new symptoms occur.   If you have lab work done today you will be contacted with your lab results within the next 2 weeks.  If you have not heard from [place] please contact [place] The fastest way to get your results is to register for My Chart.   IF you received an x-ray today, you will receive an invoice from [facility]. Please contact [facility] at [phone] questions or concerns regarding your invoice.   IF you received labwork today, you will receive an invoice from [facility] Please contact [facility] at [phone] questions or concerns regarding your invoice.   Our billing staff will not be able to assist you with questions regarding bills from these companies.  You will be contacted with the lab results as soon as they are available. The fastest way to get your results is to activate your My Chart account. Instructions are located on the last page of this paperwork. If you have not heard from [place]arding the results in 2 weeks, please contact this office.          Signed, JMerri Ray MD [facility]

## 2019-10-27 NOTE — Patient Instructions (Addendum)
   Blood pressure low today and with some low home readings, recommend decreasing labetalol to 1/2 pill (50mg ) twice per day for now and call Dr. Neta Ehlers to advise of this change and your low readings.   I will check thyroid test again today.   Return to the clinic or go to the nearest emergency room if any of your symptoms worsen or new symptoms occur.   If you have lab work done today you will be contacted with your lab results within the next 2 weeks.  If you have not heard from [place] then please contact us. The fastest way to get your results is to register for My Chart.   IF you received an x-ray today, you will receive an invoice from [facility]. Please contact [facility] at [phone] with questions or concerns regarding your invoice.   IF you received labwork today, you will receive an invoice from [facility]. Please contact [facility] at [phone] with questions or concerns regarding your invoice.   Our billing staff will not be able to assist you with questions regarding bills from these companies.  You will be contacted with the lab results as soon as they are available. The fastest way to get your results is to activate your My Chart account. Instructions are located on the last page of this paperwork. If you have not heard from [place] regarding the results in 2 weeks, please contact this office.

## 2019-10-28 LAB — CMP14+EGFR
ALT: 10 IU/L (ref 0–44)
AST: 14 IU/L (ref 0–40)
Albumin/Globulin Ratio: 2.5 — ABNORMAL HIGH (ref 1.2–2.2)
Albumin: 4.5 g/dL (ref 3.7–4.7)
Alkaline Phosphatase: 77 IU/L (ref 48–121)
BUN/Creatinine Ratio: 13 (ref 10–24)
BUN: 15 mg/dL (ref 8–27)
Bilirubin Total: 0.6 mg/dL (ref 0.0–1.2)
CO2: 28 mmol/L (ref 20–29)
Calcium: 9.5 mg/dL (ref 8.6–10.2)
Chloride: 103 mmol/L (ref 96–106)
Creatinine, Ser: 1.17 mg/dL (ref 0.76–1.27)
GFR calc Af Amer: 69 mL/min/{1.73_m2} (ref >59)
GFR calc non Af Amer: 59 mL/min/{1.73_m2} — ABNORMAL LOW (ref >59)
Globulin, Total: 1.8 g/dL (ref 1.5–4.5)
Glucose: 73 mg/dL (ref 65–99)
Potassium: 3.4 mmol/L — ABNORMAL LOW (ref 3.5–5.2)
Sodium: 146 mmol/L — ABNORMAL HIGH (ref 134–144)
Total Protein: 6.3 g/dL (ref 6.0–8.5)

## 2019-10-28 LAB — TSH+FREE T4
Free T4: 0.94 ng/dL (ref 0.82–1.77)
TSH: 5.37 u[IU]/mL — ABNORMAL HIGH (ref 0.450–4.500)

## 2019-10-28 LAB — LIPID PANEL
Chol/HDL Ratio: 1.9 ratio (ref 0.0–5.0)
Cholesterol, Total: 127 mg/dL (ref 100–199)
HDL: 67 mg/dL (ref >39)
LDL Chol Calc (NIH): 43 mg/dL (ref 0–99)
Triglycerides: 93 mg/dL (ref 0–149)
VLDL Cholesterol Cal: 17 mg/dL (ref 5–40)

## 2019-10-29 ENCOUNTER — Encounter: Payer: Self-pay | Admitting: Family Medicine

## 2019-11-02 ENCOUNTER — Other Ambulatory Visit: Payer: Self-pay | Admitting: Family Medicine

## 2019-11-02 DIAGNOSIS — R0989 Other specified symptoms and signs involving the circulatory and respiratory systems: Secondary | ICD-10-CM

## 2019-11-14 ENCOUNTER — Other Ambulatory Visit: Payer: Self-pay | Admitting: Family Medicine

## 2019-11-14 DIAGNOSIS — I10 Essential (primary) hypertension: Secondary | ICD-10-CM

## 2019-11-20 ENCOUNTER — Other Ambulatory Visit: Payer: Self-pay | Admitting: Family Medicine

## 2019-11-20 DIAGNOSIS — E039 Hypothyroidism, unspecified: Secondary | ICD-10-CM

## 2019-11-20 NOTE — Telephone Encounter (Signed)
Requested Prescriptions  Pending Prescriptions Disp Refills   levothyroxine (SYNTHROID) 50 MCG tablet [Pharmacy Med Name: LEVOTHYROXINE 50 MCG TABLET] 90 tablet 0    Sig: TAKE ONE TABLET BY MOUTH DAILY     Endocrinology:  Hypothyroid Agents Failed - 11/20/2019 10:47 PM      Failed - TSH needs to be rechecked within 3 months after an abnormal result. Refill until TSH is due.      Failed - TSH in normal range and within 360 days    TSH  Date Value Ref Range Status  10/27/2019 5.370 (H) 0.450 - 4.500 uIU/mL Final         Passed - Valid encounter within last 12 months    Recent Outpatient Visits          3 weeks ago Hypothyroidism, unspecified type   [facility], Ranell Patrick, MD   4 months ago Hypothyroidism, unspecified type   [facility], Ranell Patrick, MD   7 months ago Anxiety state   [facility], Ranell Patrick, MD   9 months ago Need for influenza vaccination   [facility], Ranell Patrick, MD   1 year ago Hypothyroidism, unspecified type   [facility], Ranell Patrick, MD      Future Appointments            In 5 months Carlota Raspberry Ranell Patrick, MD [facility] at [place], [facility]           Lab note by MD on 11/06/2019 MD did not recommend any changes to thyroid medication.

## 2019-11-21 ENCOUNTER — Other Ambulatory Visit: Payer: Self-pay | Admitting: Family Medicine

## 2019-11-21 DIAGNOSIS — I10 Essential (primary) hypertension: Secondary | ICD-10-CM

## 2019-12-06 DIAGNOSIS — Z961 Presence of intraocular lens: Secondary | ICD-10-CM | POA: Diagnosis not present

## 2019-12-06 DIAGNOSIS — H2511 Age-related nuclear cataract, right eye: Secondary | ICD-10-CM | POA: Diagnosis not present

## 2019-12-06 DIAGNOSIS — H524 Presbyopia: Secondary | ICD-10-CM | POA: Diagnosis not present

## 2019-12-06 DIAGNOSIS — H5201 Hypermetropia, right eye: Secondary | ICD-10-CM | POA: Diagnosis not present

## 2019-12-06 DIAGNOSIS — H52203 Unspecified astigmatism, bilateral: Secondary | ICD-10-CM | POA: Diagnosis not present

## 2019-12-06 DIAGNOSIS — H401132 Primary open-angle glaucoma, bilateral, moderate stage: Secondary | ICD-10-CM | POA: Diagnosis not present

## 2019-12-06 DIAGNOSIS — H43813 Vitreous degeneration, bilateral: Secondary | ICD-10-CM | POA: Diagnosis not present

## 2019-12-14 DIAGNOSIS — I952 Hypotension due to drugs: Secondary | ICD-10-CM | POA: Diagnosis not present

## 2019-12-17 ENCOUNTER — Other Ambulatory Visit: Payer: Self-pay | Admitting: Family Medicine

## 2019-12-17 DIAGNOSIS — I10 Essential (primary) hypertension: Secondary | ICD-10-CM

## 2019-12-17 NOTE — Telephone Encounter (Signed)
Requested Prescriptions  Pending Prescriptions Disp Refills   losartan (COZAAR) 100 MG tablet [Pharmacy Med Name: LOSARTAN POTASSIUM 100 MG TAB] 90 tablet 1    Sig: TAKE ONE TABLET BY MOUTH DAILY     Cardiovascular:  Angiotensin Receptor Blockers Failed - 12/17/2019  7:59 AM      Failed - K in normal range and within 180 days    Potassium  Date Value Ref Range Status  10/27/2019 3.4 (L) 3.5 - 5.2 mmol/L Final         Passed - Cr in normal range and within 180 days    Creatinine, Ser  Date Value Ref Range Status  10/27/2019 1.17 0.76 - 1.27 mg/dL Final         Passed - Patient is not pregnant      Passed - Last BP in normal range    BP Readings from Last 1 Encounters:  10/27/19 96/60         Passed - Valid encounter within last 6 months    Recent Outpatient Visits          1 month ago Hypothyroidism, unspecified type   [facility], Ranell Patrick, MD   5 months ago Hypothyroidism, unspecified type   [facility], Ranell Patrick, MD   8 months ago Anxiety state   [facility], Ranell Patrick, MD   10 months ago Need for influenza vaccination   [facility], Ranell Patrick, MD   1 year ago Hypothyroidism, unspecified type   [facility], Ranell Patrick, MD      Future Appointments            In 4 months Carlota Raspberry Ranell Patrick, MD [facility] at [place], [facility]

## 2019-12-20 ENCOUNTER — Telehealth: Payer: Self-pay | Admitting: *Deleted

## 2019-12-20 DIAGNOSIS — H409 Unspecified glaucoma: Secondary | ICD-10-CM | POA: Diagnosis not present

## 2019-12-20 DIAGNOSIS — E785 Hyperlipidemia, unspecified: Secondary | ICD-10-CM | POA: Diagnosis not present

## 2019-12-20 DIAGNOSIS — R69 Illness, unspecified: Secondary | ICD-10-CM | POA: Diagnosis not present

## 2019-12-20 DIAGNOSIS — I10 Essential (primary) hypertension: Secondary | ICD-10-CM | POA: Diagnosis not present

## 2019-12-20 DIAGNOSIS — G8929 Other chronic pain: Secondary | ICD-10-CM | POA: Diagnosis not present

## 2019-12-20 DIAGNOSIS — F411 Generalized anxiety disorder: Secondary | ICD-10-CM | POA: Diagnosis not present

## 2019-12-20 DIAGNOSIS — E039 Hypothyroidism, unspecified: Secondary | ICD-10-CM | POA: Diagnosis not present

## 2019-12-20 DIAGNOSIS — J302 Other seasonal allergic rhinitis: Secondary | ICD-10-CM | POA: Diagnosis not present

## 2019-12-20 DIAGNOSIS — I951 Orthostatic hypotension: Secondary | ICD-10-CM | POA: Diagnosis not present

## 2019-12-20 DIAGNOSIS — K219 Gastro-esophageal reflux disease without esophagitis: Secondary | ICD-10-CM | POA: Diagnosis not present

## 2019-12-20 NOTE — Telephone Encounter (Signed)
Schedule AWV.  

## 2019-12-25 DIAGNOSIS — R310 Gross hematuria: Secondary | ICD-10-CM | POA: Diagnosis not present

## 2019-12-25 DIAGNOSIS — R351 Nocturia: Secondary | ICD-10-CM | POA: Diagnosis not present

## 2019-12-25 DIAGNOSIS — N401 Enlarged prostate with lower urinary tract symptoms: Secondary | ICD-10-CM | POA: Diagnosis not present

## 2019-12-26 ENCOUNTER — Other Ambulatory Visit: Payer: Self-pay | Admitting: Family Medicine

## 2019-12-26 DIAGNOSIS — F411 Generalized anxiety disorder: Secondary | ICD-10-CM

## 2019-12-26 NOTE — Telephone Encounter (Signed)
Requested Prescriptions  Pending Prescriptions Disp Refills  . escitalopram (LEXAPRO) 20 MG tablet [Pharmacy Med Name: ESCITALOPRAM 20MG ] 90 tablet 0    Sig: TAKE 1 TABLET (20 MG TOTAL) BY MOUTH DAILY.     Psychiatry:  Antidepressants - SSRI Passed - 12/26/2019  9:23 AM      Passed - Valid encounter within last 6 months    Recent Outpatient Visits          2 months ago Hypothyroidism, unspecified type   [facility], Ranell Patrick, MD   5 months ago Hypothyroidism, unspecified type   [facility], Ranell Patrick, MD   8 months ago Anxiety state   [facility], Ranell Patrick, MD   11 months ago Need for influenza vaccination   [facility], Ranell Patrick, MD   1 year ago Hypothyroidism, unspecified type   [facility], Ranell Patrick, MD      Future Appointments            In 3 months Carlota Raspberry Ranell Patrick, MD [facility] at [place], [facility] [place]

## 2020-01-01 DIAGNOSIS — I700 Atherosclerosis of aorta: Secondary | ICD-10-CM | POA: Diagnosis not present

## 2020-01-01 DIAGNOSIS — R310 Gross hematuria: Secondary | ICD-10-CM | POA: Diagnosis not present

## 2020-01-01 DIAGNOSIS — N200 Calculus of kidney: Secondary | ICD-10-CM | POA: Diagnosis not present

## 2020-01-01 DIAGNOSIS — N400 Enlarged prostate without lower urinary tract symptoms: Secondary | ICD-10-CM | POA: Diagnosis not present

## 2020-01-01 DIAGNOSIS — K449 Diaphragmatic hernia without obstruction or gangrene: Secondary | ICD-10-CM | POA: Diagnosis not present

## 2020-01-24 DIAGNOSIS — R233 Spontaneous ecchymoses: Secondary | ICD-10-CM | POA: Diagnosis not present

## 2020-01-24 DIAGNOSIS — R1310 Dysphagia, unspecified: Secondary | ICD-10-CM | POA: Diagnosis not present

## 2020-01-29 DIAGNOSIS — R310 Gross hematuria: Secondary | ICD-10-CM | POA: Diagnosis not present

## 2020-01-29 DIAGNOSIS — N401 Enlarged prostate with lower urinary tract symptoms: Secondary | ICD-10-CM | POA: Diagnosis not present

## 2020-01-29 DIAGNOSIS — R3912 Poor urinary stream: Secondary | ICD-10-CM | POA: Diagnosis not present

## 2020-01-30 ENCOUNTER — Ambulatory Visit (INDEPENDENT_AMBULATORY_CARE_PROVIDER_SITE_OTHER): Payer: Medicare HMO | Admitting: Registered Nurse

## 2020-01-30 ENCOUNTER — Other Ambulatory Visit: Payer: Self-pay

## 2020-01-30 ENCOUNTER — Encounter: Payer: Self-pay | Admitting: Registered Nurse

## 2020-01-30 VITALS — BP 90/57 | HR 78 | Temp 97.8°F | Resp 15 | Ht 69.0 in | Wt 179.0 lb

## 2020-01-30 DIAGNOSIS — Z23 Encounter for immunization: Secondary | ICD-10-CM

## 2020-01-30 DIAGNOSIS — I952 Hypotension due to drugs: Secondary | ICD-10-CM | POA: Diagnosis not present

## 2020-01-30 NOTE — Patient Instructions (Signed)
° ° ° °  If you have lab work done today you will be contacted with your lab results within the next 2 weeks.  If you have not heard from us then please contact us. The fastest way to get your results is to register for My Chart. ° ° °IF you received an x-ray today, you will receive an invoice from [facility]. Please contact [facility] at [phone] with questions or concerns regarding your invoice.  ° °IF you received labwork today, you will receive an invoice from [facility]. Please contact [facility] at [phone] with questions or concerns regarding your invoice.  ° °Our billing staff will not be able to assist you with questions regarding bills from these companies. ° °You will be contacted with the lab results as soon as they are available. The fastest way to get your results is to activate your My Chart account. Instructions are located on the last page of this paperwork. If you have not heard from us regarding the results in 2 weeks, please contact this office. °  ° ° ° °

## 2020-01-30 NOTE — Progress Notes (Signed)
Acute Office Visit  Subjective:    Patient ID: [name], male    DOB: 01/09/1941, 79 y.o.   MRN: [id number]  Chief Complaint  Patient presents with  . Hypotension    pt has been struggling with enlarged prostate and was advised by urology to restart his Doxyzosin, pt reports doing so and started with halfing pills up to a whole one, pt notes dizzy spells since restarting especially when he stands, pt reports normal BP 128-136 over 70-80 on normal day    HPI Patient is in today for hypotension  Was recently restarted on doxyzosin by urologist for bph affecting his urination. Notes that after taking it he became more lightheaded and dizzy. No chest pain, palpitations, shob, doe, or other deviations from baseline Presented for flu shot and asked to have BP checked. Got 90s/60s, further drop when standing.   Has had a number of changes to his regimen lately but in regards to his bp, relevant medications include: Amlodipine 2.5mg  PO qd Losartan 100mg  PO qd Doxazosin 8mg  PO qd Furosemide 20mg  PO qd  No AEs with these medications.   Past Medical History:  Diagnosis Date  . Anxiety   . Arthritis   . BPH (benign prostatic hyperplasia)   . Bruises easily   . Cancer (HCC)    throat  . Complication of anesthesia    difficult urination after anesthesia  . Dizziness   . GERD (gastroesophageal reflux disease)   . Glaucoma    left worse than right  . H/O hiatal hernia   . Heart murmur   . Hyperlipemia   . Hypertension    takes meds daily  . Hypothyroidism   . Transient alteration of awareness    ~ 12/27/17, evaluated by neurologist Dr. Felecia Shelling 01/06/18  . Urinary retention     Past Surgical History:  Procedure Laterality Date  . ANTERIOR LAT LUMBAR FUSION  03/11/2012   Procedure: ANTERIOR LATERAL LUMBAR FUSION 1 LEVEL;  Surgeon: Charlie Pitter, MD;  Location: [place] [facility];  Service: Neurosurgery;  Laterality: Left;  Left lumbar four-five extreme lumbar interbody fusion with  percutaneous pedicle screws   . BACK SURGERY     ruptured disc repair  . COLONOSCOPY W/ POLYPECTOMY    . EYE SURGERY     left cataract  . HERNIA REPAIR    . INGUINAL HERNIA REPAIR Right 05/22/2014   Procedure: REPAIR RECURRENT RIGHT INGUINAL HERNIA;  Surgeon: Doreen Salvage, MD;  Location: [place];  Service: General;  Laterality: Right;  . INSERTION OF MESH Right 05/22/2014   Procedure: INSERTION OF MESH;  Surgeon: Doreen Salvage, MD;  Location: [place];  Service: General;  Laterality: Right;  . LUMBAR LAMINECTOMY/DECOMPRESSION MICRODISCECTOMY Left 01/11/2018   Procedure: Laminectomy and Foraminotomy - Left - Lumbar Three-Lumbar Four - Lumbar Five-Sacral One;  Surgeon: Earnie Larsson, MD;  Location: [place];  Service: Neurosurgery;  Laterality: Left;  Laminectomy and Foraminotomy - Left - Lumbar Three-Lumbar Four - Lumbar Five-Sacral One  . LUMBAR PERCUTANEOUS PEDICLE SCREW 1 LEVEL  03/11/2012   Procedure: LUMBAR PERCUTANEOUS PEDICLE SCREW 1 LEVEL;  Surgeon: Charlie Pitter, MD;  Location: [place] [facility];  Service: Neurosurgery;  Laterality: Left;  Left lumbar four-five extreme lumbar interbody fusion with percutaneous pedicle screws   . RADICAL NECK DISSECTION  1989  . ROTATOR CUFF REPAIR     right  . TONSILLECTOMY      History reviewed. No pertinent family history.  Social History   Socioeconomic  History  . Marital status: Married    Spouse name: Not on file  . Number of children: Not on file  . Years of education: Not on file  . Highest education level: Not on file  Occupational History  . Not on file  Tobacco Use  . Smoking status: Former Research scientist (life sciences)  . Smokeless tobacco: Never Used  Vaping Use  . Vaping Use: Never used  Substance and Sexual Activity  . Alcohol use: Yes    Comment: 1 or 2 beers a week  . Drug use: No  . Sexual activity: Not Currently  Other Topics Concern  . Not on file  Social History Narrative  . Not on file   Social Determinants of Health   Financial Resource Strain:   .  Difficulty of Paying Living Expenses: Not on file  Food Insecurity:   . Worried About Charity fundraiser in the Last Year: Not on file  . Ran Out of Food in the Last Year: Not on file  Transportation Needs:   . Lack of Transportation (Medical): Not on file  . Lack of Transportation (Non-Medical): Not on file  Physical Activity:   . Days of Exercise per Week: Not on file  . Minutes of Exercise per Session: Not on file  Stress:   . Feeling of Stress : Not on file  Social Connections:   . Frequency of Communication with Friends and Family: Not on file  . Frequency of Social Gatherings with Friends and Family: Not on file  . Attends Religious Services: Not on file  . Active Member of Clubs or Organizations: Not on file  . Attends Archivist Meetings: Not on file  . Marital Status: Not on file  Intimate Partner Violence:   . Fear of Current or Ex-Partner: Not on file  . Emotionally Abused: Not on file  . Physically Abused: Not on file  . Sexually Abused: Not on file    Outpatient Medications Prior to Visit  Medication Sig Dispense Refill  . aspirin 81 MG tablet Take 81 mg by mouth at bedtime.    . dorzolamide-timolol (COSOPT) 22.3-6.8 MG/ML ophthalmic solution Place 1 drop into both eyes 2 (two) times daily.    Marland Kitchen doxazosin (CARDURA) 8 MG tablet TAKE ONE TABLET BY MOUTH DAILY 90 tablet 0  . escitalopram (LEXAPRO) 20 MG tablet TAKE 1 TABLET (20 MG TOTAL) BY MOUTH DAILY. 90 tablet 0  . fluocinonide cream (LIDEX) 4.40 % Apply 1 application topically 2 (two) times daily. 30 g 0  . fluticasone (FLONASE) 50 MCG/ACT nasal spray Place 1 spray into both nostrils daily. (Patient taking differently: Place 1 spray into both nostrils daily as needed for allergies. ) 16 g 6  . furosemide (LASIX) 20 MG tablet TAKE ONE TABLET BY MOUTH DAILY 90 tablet 0  . ipratropium (ATROVENT) 0.03 % nasal spray Place 1 spray into both nostrils 2 (two) times daily as needed for rhinitis. 30 mL 5  .  levothyroxine (SYNTHROID) 50 MCG tablet TAKE ONE TABLET BY MOUTH DAILY 90 tablet 0  . losartan (COZAAR) 100 MG tablet TAKE ONE TABLET BY MOUTH DAILY 90 tablet 1  . omeprazole (PRILOSEC) 20 MG capsule Take 1 capsule (20 mg total) by mouth every other day. IN THE MORNING 90 capsule 1  . simvastatin (ZOCOR) 20 MG tablet Take 1 tablet (20 mg total) by mouth every evening. 90 tablet 3  . Tafluprost 0.0015 % SOLN Place 1 drop into both eyes at bedtime. ZIOPTAN (  tafluprost)    . amLODipine (NORVASC) 2.5 MG tablet TAKE ONE TABLET BY MOUTH DAILY 90 tablet 0  . labetalol (NORMODYNE) 100 MG tablet TAKE ONE TABLET BY MOUTH TWICE A DAY (Patient not taking: Reported on 01/30/2020) 180 tablet 0   No facility-administered medications prior to visit.    Allergies  Allergen Reactions  . Codeine Nausea Only    Review of Systems Per hpi      Objective:    Physical Exam Vitals and nursing note reviewed.  Constitutional:      General: He is not in acute distress.    Appearance: Normal appearance. He is not ill-appearing, toxic-appearing or diaphoretic.  HENT:     Head: Normocephalic and atraumatic.  Cardiovascular:     Rate and Rhythm: Normal rate and regular rhythm.     Heart sounds: Normal heart sounds. No murmur heard.  No friction rub. No gallop.   Pulmonary:     Effort: Pulmonary effort is normal. No respiratory distress.     Breath sounds: Normal breath sounds. No stridor. No wheezing, rhonchi or rales.  Chest:     Chest wall: No tenderness.  Skin:    General: Skin is warm and dry.     Capillary Refill: Capillary refill takes less than 2 seconds.     Coloration: Skin is not pale.  Neurological:     General: No focal deficit present.     Mental Status: He is alert and oriented to person, place, and time. Mental status is at baseline.     Cranial Nerves: No cranial nerve deficit.     Sensory: No sensory deficit.     Motor: No weakness.  Psychiatric:        Mood and Affect: Mood normal.         Behavior: Behavior normal.        Thought Content: Thought content normal.        Judgment: Judgment normal.     BP (!) 90/57   Pulse 78   Temp 97.8 F (36.6 C) (Temporal)   Resp 15   Ht 5\' 9"  (1.753 m)   Wt 179 lb (81.2 kg)   SpO2 97%   BMI 26.43 kg/m  Wt Readings from Last 3 Encounters:  01/30/20 179 lb (81.2 kg)  10/27/19 179 lb (81.2 kg)  08/10/19 181 lb 3.2 oz (82.2 kg)    There are no preventive care reminders to display for this patient.  There are no preventive care reminders to display for this patient.   Lab Results  Component Value Date   TSH 5.370 (H) 10/27/2019   Lab Results  Component Value Date   WBC 4.7 01/05/2018   HGB 13.4 01/05/2018   HCT 39.9 01/05/2018   MCV 99.5 01/05/2018   PLT 189 01/05/2018   Lab Results  Component Value Date   NA 146 (H) 10/27/2019   K 3.4 (L) 10/27/2019   CO2 28 10/27/2019   GLUCOSE 73 10/27/2019   BUN 15 10/27/2019   CREATININE 1.17 10/27/2019   BILITOT 0.6 10/27/2019   ALKPHOS 77 10/27/2019   AST 14 10/27/2019   ALT 10 10/27/2019   PROT 6.3 10/27/2019   ALBUMIN 4.5 10/27/2019   CALCIUM 9.5 10/27/2019   ANIONGAP 8 01/05/2018   Lab Results  Component Value Date   CHOL 127 10/27/2019   Lab Results  Component Value Date   HDL 67 10/27/2019   Lab Results  Component Value Date   LDLCALC 43 10/27/2019  Lab Results  Component Value Date   TRIG 93 10/27/2019   Lab Results  Component Value Date   CHOLHDL 1.9 10/27/2019   No results found for: HGBA1C     Assessment & Plan:   Problem List Items Addressed This Visit    None    Visit Diagnoses    Hypotension due to drugs    -  Primary       No orders of the defined types were placed in this encounter.  PLAN  Likely doxazosin, especially restart at 8mg , contributed to current hypotension. However, given hx of significant bph, will maintain this until he is able to discuss this with urology  For now I will have him hold amlodipine  2.5mg , watch I/O, and return on Friday for further assessment   Continue to monitor home BP  Lightheadedness significantly improved by visit's end. Pt able to lie to sit, sit to stand without significant lightheadedness  Patient encouraged to call clinic with any questions, comments, or concerns.   Maximiano Coss, NP

## 2020-01-30 NOTE — Patient Instructions (Addendum)
Stop taking amlodipine 2.5mg  I am hoping this will boost your blood pressure back to where it needs to be Ideally we will see numbers with a minimum of 100/60. Of course, our ideal is around 120/80.   I want you to continue the other medications as prescribed, including the doxyzosin. I will reach out to your urology team to inquire about changing this medication.   Please return on Friday (anytime during the day) to see Dr. Linna Darner for follow up for this visit. He may consider lowering a dose of another of your medications if your blood pressure remains too low.     If you have lab work done today you will be contacted with your lab results within the next 2 weeks.  If you have not heard from [place] then please contact us. The fastest way to get your results is to register for My Chart.   IF you received an x-ray today, you will receive an invoice from [facility]. Please contact [facility] at [phone] with questions or concerns regarding your invoice.   IF you received labwork today, you will receive an invoice from [facility]. Please contact [facility] at [phone] with questions or concerns regarding your invoice.   Our billing staff will not be able to assist you with questions regarding bills from these companies.  You will be contacted with the lab results as soon as they are available. The fastest way to get your results is to activate your My Chart account. Instructions are located on the last page of this paperwork. If you have not heard from [place] regarding the results in 2 weeks, please contact this office.

## 2020-01-30 NOTE — Progress Notes (Signed)
No  Notes required

## 2020-02-02 ENCOUNTER — Ambulatory Visit: Payer: Medicare HMO | Admitting: Family Medicine

## 2020-02-07 ENCOUNTER — Other Ambulatory Visit: Payer: Self-pay

## 2020-02-07 ENCOUNTER — Encounter: Payer: Self-pay | Admitting: Registered Nurse

## 2020-02-07 ENCOUNTER — Ambulatory Visit (INDEPENDENT_AMBULATORY_CARE_PROVIDER_SITE_OTHER): Payer: Medicare HMO | Admitting: Registered Nurse

## 2020-02-07 VITALS — BP 150/80 | HR 68 | Temp 98.0°F | Resp 18 | Ht 69.0 in | Wt 175.2 lb

## 2020-02-07 DIAGNOSIS — I959 Hypotension, unspecified: Secondary | ICD-10-CM | POA: Diagnosis not present

## 2020-02-07 DIAGNOSIS — R1310 Dysphagia, unspecified: Secondary | ICD-10-CM | POA: Diagnosis not present

## 2020-02-07 NOTE — Patient Instructions (Signed)
° ° ° °  If you have lab work done today you will be contacted with your lab results within the next 2 weeks.  If you have not heard from us then please contact us. The fastest way to get your results is to register for My Chart. ° ° °IF you received an x-ray today, you will receive an invoice from [facility]. Please contact [facility] at [phone] with questions or concerns regarding your invoice.  ° °IF you received labwork today, you will receive an invoice from [facility]. Please contact [facility] at [phone] with questions or concerns regarding your invoice.  ° °Our billing staff will not be able to assist you with questions regarding bills from these companies. ° °You will be contacted with the lab results as soon as they are available. The fastest way to get your results is to activate your My Chart account. Instructions are located on the last page of this paperwork. If you have not heard from us regarding the results in 2 weeks, please contact this office. °  ° ° ° °

## 2020-02-07 NOTE — Progress Notes (Signed)
Established Patient Office Visit  Subjective:  Patient ID: [name], male    DOB: 1941/03/15  Age: 79 y.o. MRN: [id number]  CC:  Chief Complaint  Patient presents with  . Follow-up    Patient states that he is here for his follow up for Blood Pressure. Per patient one minute BP is High then its low 80/40 at home     HPI [name] presents for bp follow up   Notes that his home readings have occasionally been low but mostly around 120s/80s. Highest reading was one instance of 160/80 once. No symptoms of hyper or hypotension. Feeling well overall - beyond baseline complaints. He does note that he had a swallow study this morning with a barium swallow. He is having increasing difficulty swallowing foods and beverages.   Past Medical History:  Diagnosis Date  . Anxiety   . Arthritis   . BPH (benign prostatic hyperplasia)   . Bruises easily   . Cancer (HCC)    throat  . Complication of anesthesia    difficult urination after anesthesia  . Dizziness   . GERD (gastroesophageal reflux disease)   . Glaucoma    left worse than right  . H/O hiatal hernia   . Heart murmur   . Hyperlipemia   . Hypertension    takes meds daily  . Hypothyroidism   . Transient alteration of awareness    ~ 12/27/17, evaluated by neurologist Dr. Felecia Shelling 01/06/18  . Urinary retention     Past Surgical History:  Procedure Laterality Date  . ANTERIOR LAT LUMBAR FUSION  03/11/2012   Procedure: ANTERIOR LATERAL LUMBAR FUSION 1 LEVEL;  Surgeon: Charlie Pitter, MD;  Location: [place] [facility];  Service: Neurosurgery;  Laterality: Left;  Left lumbar four-five extreme lumbar interbody fusion with percutaneous pedicle screws   . BACK SURGERY     ruptured disc repair  . COLONOSCOPY W/ POLYPECTOMY    . EYE SURGERY     left cataract  . HERNIA REPAIR    . INGUINAL HERNIA REPAIR Right 05/22/2014   Procedure: REPAIR RECURRENT RIGHT INGUINAL HERNIA;  Surgeon: Doreen Salvage, MD;  Location: [place];  Service:  General;  Laterality: Right;  . INSERTION OF MESH Right 05/22/2014   Procedure: INSERTION OF MESH;  Surgeon: Doreen Salvage, MD;  Location: [place];  Service: General;  Laterality: Right;  . LUMBAR LAMINECTOMY/DECOMPRESSION MICRODISCECTOMY Left 01/11/2018   Procedure: Laminectomy and Foraminotomy - Left - Lumbar Three-Lumbar Four - Lumbar Five-Sacral One;  Surgeon: Earnie Larsson, MD;  Location: [place];  Service: Neurosurgery;  Laterality: Left;  Laminectomy and Foraminotomy - Left - Lumbar Three-Lumbar Four - Lumbar Five-Sacral One  . LUMBAR PERCUTANEOUS PEDICLE SCREW 1 LEVEL  03/11/2012   Procedure: LUMBAR PERCUTANEOUS PEDICLE SCREW 1 LEVEL;  Surgeon: Charlie Pitter, MD;  Location: [place] [facility];  Service: Neurosurgery;  Laterality: Left;  Left lumbar four-five extreme lumbar interbody fusion with percutaneous pedicle screws   . RADICAL NECK DISSECTION  1989  . ROTATOR CUFF REPAIR     right  . TONSILLECTOMY      No family history on file.  Social History   Socioeconomic History  . Marital status: Married    Spouse name: Not on file  . Number of children: Not on file  . Years of education: Not on file  . Highest education level: Not on file  Occupational History  . Not on file  Tobacco Use  . Smoking status: Former Research scientist (life sciences)  .  Smokeless tobacco: Never Used  Vaping Use  . Vaping Use: Never used  Substance and Sexual Activity  . Alcohol use: Yes    Comment: 1 or 2 beers a week  . Drug use: No  . Sexual activity: Not Currently  Other Topics Concern  . Not on file  Social History Narrative  . Not on file   Social Determinants of Health   Financial Resource Strain:   . Difficulty of Paying Living Expenses: Not on file  Food Insecurity:   . Worried About Charity fundraiser in the Last Year: Not on file  . Ran Out of Food in the Last Year: Not on file  Transportation Needs:   . Lack of Transportation (Medical): Not on file  . Lack of Transportation (Non-Medical): Not on file  Physical  Activity:   . Days of Exercise per Week: Not on file  . Minutes of Exercise per Session: Not on file  Stress:   . Feeling of Stress : Not on file  Social Connections:   . Frequency of Communication with Friends and Family: Not on file  . Frequency of Social Gatherings with Friends and Family: Not on file  . Attends Religious Services: Not on file  . Active Member of Clubs or Organizations: Not on file  . Attends Archivist Meetings: Not on file  . Marital Status: Not on file  Intimate Partner Violence:   . Fear of Current or Ex-Partner: Not on file  . Emotionally Abused: Not on file  . Physically Abused: Not on file  . Sexually Abused: Not on file    Outpatient Medications Prior to Visit  Medication Sig Dispense Refill  . aspirin 81 MG tablet Take 81 mg by mouth at bedtime.    . dorzolamide-timolol (COSOPT) 22.3-6.8 MG/ML ophthalmic solution Place 1 drop into both eyes 2 (two) times daily.    Marland Kitchen doxazosin (CARDURA) 8 MG tablet TAKE ONE TABLET BY MOUTH DAILY 90 tablet 0  . escitalopram (LEXAPRO) 20 MG tablet TAKE 1 TABLET (20 MG TOTAL) BY MOUTH DAILY. 90 tablet 0  . fluocinonide cream (LIDEX) 2.37 % Apply 1 application topically 2 (two) times daily. 30 g 0  . fluticasone (FLONASE) 50 MCG/ACT nasal spray Place 1 spray into both nostrils daily. (Patient taking differently: Place 1 spray into both nostrils daily as needed for allergies. ) 16 g 6  . furosemide (LASIX) 20 MG tablet TAKE ONE TABLET BY MOUTH DAILY 90 tablet 0  . ipratropium (ATROVENT) 0.03 % nasal spray Place 1 spray into both nostrils 2 (two) times daily as needed for rhinitis. 30 mL 5  . levothyroxine (SYNTHROID) 50 MCG tablet TAKE ONE TABLET BY MOUTH DAILY 90 tablet 0  . losartan (COZAAR) 100 MG tablet TAKE ONE TABLET BY MOUTH DAILY 90 tablet 1  . omeprazole (PRILOSEC) 20 MG capsule Take 1 capsule (20 mg total) by mouth every other day. IN THE MORNING 90 capsule 1  . simvastatin (ZOCOR) 20 MG tablet Take 1 tablet  (20 mg total) by mouth every evening. 90 tablet 3  . Tafluprost 0.0015 % SOLN Place 1 drop into both eyes at bedtime. ZIOPTAN (tafluprost)     No facility-administered medications prior to visit.    Allergies  Allergen Reactions  . Codeine Nausea Only    ROS Review of Systems  Constitutional: Negative.   HENT: Positive for trouble swallowing.   Eyes: Negative.   Respiratory: Negative.   Cardiovascular: Negative.   Gastrointestinal: Negative.  Genitourinary: Negative.   Musculoskeletal: Negative.   Skin: Negative.   Neurological: Negative.  Negative for dizziness, light-headedness and headaches.  Psychiatric/Behavioral: Negative.       Objective:    Physical Exam Constitutional:      General: He is not in acute distress.    Appearance: Normal appearance. He is normal weight. He is not ill-appearing, toxic-appearing or diaphoretic.  Cardiovascular:     Rate and Rhythm: Normal rate and regular rhythm.     Heart sounds: Normal heart sounds. No murmur heard.  No friction rub. No gallop.   Pulmonary:     Effort: Pulmonary effort is normal. No respiratory distress.     Breath sounds: Normal breath sounds. No stridor. No wheezing, rhonchi or rales.  Chest:     Chest wall: No tenderness.  Neurological:     General: No focal deficit present.     Mental Status: He is alert and oriented to person, place, and time. Mental status is at baseline.  Psychiatric:        Mood and Affect: Mood normal.        Behavior: Behavior normal.        Thought Content: Thought content normal.        Judgment: Judgment normal.     BP (!) 150/80   Pulse 68   Temp 98 F (36.7 C) (Temporal)   Resp 18   Ht 5\' 9"  (1.753 m)   Wt 175 lb 3.2 oz (79.5 kg)   SpO2 98%   BMI 25.87 kg/m  Wt Readings from Last 3 Encounters:  02/07/20 175 lb 3.2 oz (79.5 kg)  01/30/20 179 lb (81.2 kg)  10/27/19 179 lb (81.2 kg)     There are no preventive care reminders to display for this patient.  There  are no preventive care reminders to display for this patient.  Lab Results  Component Value Date   TSH 5.370 (H) 10/27/2019   Lab Results  Component Value Date   WBC 4.7 01/05/2018   HGB 13.4 01/05/2018   HCT 39.9 01/05/2018   MCV 99.5 01/05/2018   PLT 189 01/05/2018   Lab Results  Component Value Date   NA 146 (H) 10/27/2019   K 3.4 (L) 10/27/2019   CO2 28 10/27/2019   GLUCOSE 73 10/27/2019   BUN 15 10/27/2019   CREATININE 1.17 10/27/2019   BILITOT 0.6 10/27/2019   ALKPHOS 77 10/27/2019   AST 14 10/27/2019   ALT 10 10/27/2019   PROT 6.3 10/27/2019   ALBUMIN 4.5 10/27/2019   CALCIUM 9.5 10/27/2019   ANIONGAP 8 01/05/2018   Lab Results  Component Value Date   CHOL 127 10/27/2019   Lab Results  Component Value Date   HDL 67 10/27/2019   Lab Results  Component Value Date   LDLCALC 43 10/27/2019   Lab Results  Component Value Date   TRIG 93 10/27/2019   Lab Results  Component Value Date   CHOLHDL 1.9 10/27/2019   No results found for: HGBA1C    Assessment & Plan:   Problem List Items Addressed This Visit    None    Visit Diagnoses    Hypotension, unspecified hypotension type    -  Primary      No orders of the defined types were placed in this encounter.   Follow-up: No follow-ups on file.   PLAN  Near resolved with withdrawal of amlodipine  Continue regimen as prescribed  Return as scheduled for follow up with  PCP  Continue to monitor home bp  Patient encouraged to call clinic with any questions, comments, or concerns.  Maximiano Coss, NP

## 2020-02-09 ENCOUNTER — Telehealth: Payer: Self-pay | Admitting: Internal Medicine

## 2020-02-09 NOTE — Telephone Encounter (Signed)
Spoke with pt and continues to have B/P issues Per pt one day B/P 200/100 and then the next will low 88/40 Per pt on the days that B/P is high notes h/a,or pressure feeling in ears or neck and on days when low feels"whoozy" HR is fine ranging 70-90 Per pt has not been taking Furosemide for several days. Also pt has loss some weight Per pt feels B/P issues have been ongoing for about 2 years Will forward to Dr Debara Pickett for review and recommendations./cy

## 2020-02-09 NOTE — Telephone Encounter (Signed)
Labile blood pressures look like a new issue - I would recommend he take BP 3x daily for a least a week (morning, midday and night) - take medications as directed. I would be happy to see him back in the office to review those numbers.  Dr Debara Pickett

## 2020-02-09 NOTE — Telephone Encounter (Signed)
° ° °  Pt c/o BP issue: STAT if pt c/o blurred vision, one-sided weakness or slurred speech  1. What are your last 5 BP readings? 200/100, 80/40  2. Are you having any other symptoms (ex. Dizziness, headache, blurred vision, passed out)?   3. What is your BP issue? Pt said his BP fluctuates everyday. Yesterday his BP 200+/100+ and today it's 80/40. He said his BP is like this everyday. He would like to speak with Dr. Debara Pickett or nurse to know what he needs to do

## 2020-02-12 NOTE — Telephone Encounter (Signed)
Pt aware and appt made with Dr Debara Pickett for 03/07/20 at 1:45 pm .Adonis Housekeeper

## 2020-02-15 DIAGNOSIS — R1313 Dysphagia, pharyngeal phase: Secondary | ICD-10-CM | POA: Diagnosis not present

## 2020-02-15 DIAGNOSIS — R059 Cough, unspecified: Secondary | ICD-10-CM | POA: Diagnosis not present

## 2020-02-16 ENCOUNTER — Other Ambulatory Visit: Payer: Self-pay | Admitting: Family Medicine

## 2020-02-16 DIAGNOSIS — R0989 Other specified symptoms and signs involving the circulatory and respiratory systems: Secondary | ICD-10-CM

## 2020-02-17 ENCOUNTER — Ambulatory Visit: Payer: Medicare HMO | Attending: Internal Medicine

## 2020-02-17 ENCOUNTER — Other Ambulatory Visit: Payer: Self-pay

## 2020-02-17 DIAGNOSIS — Z23 Encounter for immunization: Secondary | ICD-10-CM

## 2020-02-17 NOTE — Progress Notes (Signed)
   Covid-19 Vaccination Clinic  Name:  CHRISTPHER STOGSDILL    MRN: [id number] DOB: 03/20/1941  02/17/2020  Mr. Duell was observed post Covid-19 immunization for 15 minutes without incident. He was provided with Vaccine Information Sheet and instruction to access the V-Safe system.   Mr. Jolliff was instructed to call 911 with any severe reactions post vaccine: Marland Kitchen Difficulty breathing  . Swelling of face and throat  . A fast heartbeat  . A bad rash all over body  . Dizziness and weakness

## 2020-02-18 ENCOUNTER — Other Ambulatory Visit: Payer: Self-pay | Admitting: Family Medicine

## 2020-02-18 DIAGNOSIS — R0989 Other specified symptoms and signs involving the circulatory and respiratory systems: Secondary | ICD-10-CM

## 2020-02-20 ENCOUNTER — Other Ambulatory Visit: Payer: Self-pay | Admitting: Family Medicine

## 2020-02-20 ENCOUNTER — Telehealth: Payer: Self-pay | Admitting: Family Medicine

## 2020-02-20 DIAGNOSIS — I10 Essential (primary) hypertension: Secondary | ICD-10-CM

## 2020-02-20 DIAGNOSIS — E039 Hypothyroidism, unspecified: Secondary | ICD-10-CM

## 2020-02-20 NOTE — Telephone Encounter (Signed)
Signed        Pt is needing refills in  What is the name of the medicationlevothyroxine (SYNTHROID) 50 MCG tablet [[id number] And furosemide (LASIX) 20 MG tablet [[id number]    Have you contacted your pharmacy to request a refill? y  Which pharmacy would you like this sent to? [name] at [place], [place] - [address]  [place], [place] [address]  Phone:  [phone] Fax:  [phone]    Patient notified that their request is being sent to the clinical staff for review and that they should receive a call once it is complete. If they do not receive a call within 72 hours they can check with their pharmacy or our office.

## 2020-02-20 NOTE — Telephone Encounter (Signed)
Pt is needing refills in  What is the name of the medicationlevothyroxine (SYNTHROID) 50 MCG tablet [[id number] And furosemide (LASIX) 20 MG tablet [[id number]    Have you contacted your pharmacy to request a refill? y  Which pharmacy would you like this sent to? [name] at [place], [place] - [place] [address]  [place], [place] [address]  Phone:  [phone] Fax:  [phone]    Patient notified that their request is being sent to the clinical staff for review and that they should receive a call once it is complete. If they do not receive a call within 72 hours they can check with their pharmacy or our office.

## 2020-02-22 ENCOUNTER — Other Ambulatory Visit: Payer: Self-pay | Admitting: Family Medicine

## 2020-02-22 DIAGNOSIS — R0989 Other specified symptoms and signs involving the circulatory and respiratory systems: Secondary | ICD-10-CM

## 2020-02-23 ENCOUNTER — Other Ambulatory Visit: Payer: Self-pay | Admitting: Family Medicine

## 2020-02-23 ENCOUNTER — Telehealth: Payer: Self-pay | Admitting: Family Medicine

## 2020-02-23 DIAGNOSIS — R0989 Other specified symptoms and signs involving the circulatory and respiratory systems: Secondary | ICD-10-CM

## 2020-02-23 NOTE — Telephone Encounter (Signed)
Pt came in and had a visit for all of his medications to be refilled the only Rx that is not is.Pt stated when he goes to pharmacy they tell him that our office is denying him. Pt has and upcoming appt with provider in December. Pt is on last pill. Please advise.  amLODipine (NORVASC) 2.5 MG tablet [[id number]

## 2020-02-26 ENCOUNTER — Other Ambulatory Visit: Payer: Self-pay

## 2020-02-26 DIAGNOSIS — R0989 Other specified symptoms and signs involving the circulatory and respiratory systems: Secondary | ICD-10-CM

## 2020-02-26 MED ORDER — AMLODIPINE BESYLATE 2.5 MG PO TABS: 2.5000 mg | ORAL_TABLET | Freq: Every day | ORAL | 0 refills | Status: DC

## 2020-02-28 DIAGNOSIS — R1319 Other dysphagia: Secondary | ICD-10-CM | POA: Diagnosis not present

## 2020-02-29 DIAGNOSIS — R1319 Other dysphagia: Secondary | ICD-10-CM | POA: Diagnosis not present

## 2020-03-04 ENCOUNTER — Other Ambulatory Visit: Payer: Self-pay

## 2020-03-04 ENCOUNTER — Other Ambulatory Visit: Payer: Self-pay | Admitting: Family Medicine

## 2020-03-04 ENCOUNTER — Ambulatory Visit (INDEPENDENT_AMBULATORY_CARE_PROVIDER_SITE_OTHER): Payer: Medicare HMO | Admitting: Family Medicine

## 2020-03-04 ENCOUNTER — Encounter: Payer: Self-pay | Admitting: Family Medicine

## 2020-03-04 VITALS — BP 162/88 | HR 72 | Temp 97.3°F | Ht 69.0 in | Wt 171.0 lb

## 2020-03-04 DIAGNOSIS — R69 Illness, unspecified: Secondary | ICD-10-CM | POA: Diagnosis not present

## 2020-03-04 DIAGNOSIS — F411 Generalized anxiety disorder: Secondary | ICD-10-CM

## 2020-03-04 DIAGNOSIS — F418 Other specified anxiety disorders: Secondary | ICD-10-CM | POA: Diagnosis not present

## 2020-03-04 DIAGNOSIS — R0989 Other specified symptoms and signs involving the circulatory and respiratory systems: Secondary | ICD-10-CM

## 2020-03-04 MED ORDER — ESCITALOPRAM OXALATE 20 MG PO TABS: 20.0000 mg | ORAL_TABLET | Freq: Every day | ORAL | 1 refills | Status: DC

## 2020-03-04 NOTE — Patient Instructions (Addendum)
  Although blood pressure slightly elevated in office, overall your home readings appear to be okay on the current regimen.  There is some variability, but I am hesitant to change medications at this time.  Keep follow-up with Dr. Debara Pickett as planned.  If you do have more lower urinary symptoms, I would recommend starting on very low-dose doxazosin, potentially 2 or 4 mg, not 8 mg.  Follow-up with me in 3 months, but let me know if there are questions sooner and take care.    If you have lab work done today you will be contacted with your lab results within the next 2 weeks.  If you have not heard from [place] then please contact us. The fastest way to get your results is to register for My Chart.   IF you received an x-ray today, you will receive an invoice from [facility]. Please contact [facility] at [phone] with questions or concerns regarding your invoice.   IF you received labwork today, you will receive an invoice from [facility]. Please contact [facility] at [phone] with questions or concerns regarding your invoice.   Our billing staff will not be able to assist you with questions regarding bills from these companies.  You will be contacted with the lab results as soon as they are available. The fastest way to get your results is to activate your My Chart account. Instructions are located on the last page of this paperwork. If you have not heard from [place] regarding the results in 2 weeks, please contact this office.

## 2020-03-04 NOTE — Telephone Encounter (Signed)
Pt needs his Lexapro RX changed to the mail order pharmacy/ Pt stated he told Dr. Nyoka Cowden which pharmacy during his visit/ please advise and send to  [facility], [place] Phone:  [phone]  Fax:  [phone]

## 2020-03-04 NOTE — Progress Notes (Signed)
Subjective:  Patient ID: [name], male    DOB: January 30, 1941  Age: 79 y.o. MRN: [id number]  CC:  Chief Complaint  Patient presents with  . medication review    Pt would like to go over his medications with the provider. pt states his BP has been running perfect at home. Pt would like to cut down the amount of medication he takes.    HPI [name] presents for   Hypertension: With history of hypotension, labile htn.  Decreased his labetalol to 50 mg twice per day in June, with plan to continue follow up with nephrologist Dr. Neta Ehlers due to his history of labile hypertension.  August 12 visit with nephrology, home readings reportedly in the 90s to 110s over 50s to 45s.  Doxazosin reduced to 4 mg daily.  47-monthfollow-up planned.  Seen by my colleague on September 28 with blood pressure 90/57.  He was back on higher dose of doxazosin 8 mg daily at that time as had more BPH with LUTS symptoms and had met with urologist.  Was symptomatic with lightheadedness/dizziness.  Meds at that time were amlodipine 2.5 mg daily, losartan 100 mg daily doxazosin 8 mg daily, furosemide 20 mg daily,However med list still indicated labetalol.  Amlodipine was discontinued.  October 6 follow-up, blood pressure 150/80.  Home readings around 120s over 80s, with some elevated readings.  No med changes.  Phone note from October 8 to cardiology.  Blood pressure up to 200/100, low of 88/40.  Blood pressure monitoring 3 times daily recommended then in office follow-up.  Has appointment with cardiology in 3 days.  NOT taking amlodipine in past few weeks.  NOT taking doxazosin past few weeks. Urinating ok.  NOT taking beta blocker.  IS taking taking lasix 27mqd - in am.  IS taking losartan 10031md - in am  Feeling ok, no recent dizziness.  No chest pain,HA, dyspnea.  No focal weakness.  Less intake with swallowing issues - under care of ENT.  Drinking fluids.   Lab Results  Component Value  Date   CREATININE 1.17 10/27/2019    Monthly readings:     Depression with anxiety: Mood doing ok, even with health changes.  Pharmacy has told him he can fill 6 months.   Dep[facility] screen [facility] 03/04/2020 02/07/2020 01/30/2020 10/27/2019 07/20/2019  Decreased Interest 0 0 0 0 0  Down, Depressed, Hopeless 0 0 0 0 0  PHQ - 2 Score 0 0 0 0 0     History Patient Active Problem List   Diagnosis Date Noted  . Lumbar radiculopathy 01/11/2018  . Transient alteration of awareness 01/06/2018  . Abnormal head CT 01/06/2018  . Hypertension 09/18/2016  . Benign essential hypertension 11/18/2015  . Acquired hypothyroidism 08/30/2015  . 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 Hypercholesteremia 08/30/2015  . 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 Anxiety   . Arthritis   . BPH (benign prostatic hyperplasia)   . Bruises easily   . Cancer (HCC)    throat  . Complication of anesthesia    difficult urination after anesthesia  . Dizziness   . GERD (gastroesophageal reflux disease)   . Glaucoma    left worse than right  . H/O hiatal hernia   . Heart murmur   . Hyperlipemia   . Hypertension    takes meds daily  . Hypothyroidism   . Transient alteration of awareness    ~  12/27/17, evaluated by neurologist Dr. Felecia Shelling 01/06/18  . Urinary retention    Past Surgical History:  Procedure Laterality Date  . ANTERIOR LAT LUMBAR FUSION  03/11/2012   Procedure: ANTERIOR LATERAL LUMBAR FUSION 1 LEVEL;  Surgeon: Charlie Pitter, MD;  Location: [facility];  Service: Neurosurgery;  Laterality: Left;  Left lumbar four-five extreme lumbar interbody fusion with percutaneous pedicle screws   . BACK SURGERY     ruptured disc repair  . COLONOSCOPY W/ POLYPECTOMY    . EYE SURGERY     left cataract  . HERNIA REPAIR    . INGUINAL HERNIA REPAIR Right 05/22/2014   Procedure: REPAIR RECURRENT RIGHT INGUINAL HERNIA;  Surgeon: Doreen Salvage, MD;   Location: [facility];  Service: General;  Laterality: Right;  . INSERTION OF MESH Right 05/22/2014   Procedure: INSERTION OF MESH;  Surgeon: Doreen Salvage, MD;  Location: [facility];  Service: General;  Laterality: Right;  . LUMBAR LAMINECTOMY/DECOMPRESSION MICRODISCECTOMY Left 01/11/2018   Procedure: Laminectomy and Foraminotomy - Left - Lumbar Three-Lumbar Four - Lumbar Five-Sacral One;  Surgeon: Earnie Larsson, MD;  Location: [facility];  Service: Neurosurgery;  Laterality: Left;  Laminectomy and Foraminotomy - Left - Lumbar Three-Lumbar Four - Lumbar Five-Sacral One  . LUMBAR PERCUTANEOUS PEDICLE SCREW 1 LEVEL  03/11/2012   Procedure: LUMBAR PERCUTANEOUS PEDICLE SCREW 1 LEVEL;  Surgeon: Charlie Pitter, MD;  Location: [facility];  Service: Neurosurgery;  Laterality: Left;  Left lumbar four-five extreme lumbar interbody fusion with percutaneous pedicle screws   . RADICAL NECK DISSECTION  1989  . ROTATOR CUFF REPAIR     right  . TONSILLECTOMY     Allergies  Allergen Reactions  . Codeine Nausea Only   Prior to Admission medications   Medication Sig Start Date End Date Taking? Authorizing Provider  aspirin 81 MG tablet Take 81 mg by mouth at bedtime.   Yes [provider]  dorzolamide-timolol (COSOPT) 22.3-6.8 MG/ML ophthalmic solution Place 1 drop into both eyes 2 (two) times daily.   Yes [provider]  fluocinonide cream (LIDEX) 4.53 % Apply 1 application topically 2 (two) times daily. 03/09/18  Yes [name], MD  fluticasone (FLONASE) 50 MCG/ACT nasal spray Place 1 spray into both nostrils daily. Patient taking differently: Place 1 spray into both nostrils daily as needed for allergies.  09/02/17  Yes [name], MD  furosemide (LASIX) 20 MG tablet TAKE ONE TABLET BY MOUTH DAILY 02/20/20  Yes [name], MD  ipratropium (ATROVENT) 0.03 % nasal spray Place 1 spray into both nostrils 2 (two) times daily as needed for rhinitis. 09/02/17  Yes [name], MD  levothyroxine  (SYNTHROID) 50 MCG tablet TAKE ONE TABLET BY MOUTH DAILY 02/20/20  Yes [name], MD  losartan (COZAAR) 100 MG tablet TAKE ONE TABLET BY MOUTH DAILY 12/17/19  Yes [name], MD  omeprazole (PRILOSEC) 20 MG capsule Take 1 capsule (20 mg total) by mouth every other day. IN THE MORNING 04/20/19  Yes [name], MD  simvastatin (ZOCOR) 20 MG tablet Take 1 tablet (20 mg total) by mouth every evening. 04/20/19  Yes [name], MD  Tafluprost 0.0015 % SOLN Place 1 drop into both eyes at bedtime. ZIOPTAN (tafluprost) 09/15/16  Yes [provider]  amLODipine (NORVASC) 2.5 MG tablet Take 1 tablet (2.5 mg total) by mouth daily. Patient not taking: Reported on 03/04/2020 02/26/20   [name], MD  doxazosin (CARDURA) 8 MG  tablet TAKE ONE TABLET BY MOUTH DAILY Patient not taking: Reported on 03/04/2020 11/21/19   [name], MD  escitalopram (LEXAPRO) 20 MG tablet TAKE 1 TABLET (20 MG TOTAL) BY MOUTH DAILY. Patient not taking: Reported on 03/04/2020 12/26/19   [name], MD   Social History   Socioeconomic History  . Marital status: Married    Spouse name: Not on file  . Number of children: Not on file  . Years of education: Not on file  . Highest education level: Not on file  Occupational History  . Not on file  Tobacco Use  . Smoking status: Former Research scientist (life sciences)  . Smokeless tobacco: Never Used  Vaping Use  . Vaping Use: Never used  Substance and Sexual Activity  . Alcohol use: Yes    Comment: 1 or 2 beers a week  . Drug use: No  . Sexual activity: Not Currently  Other Topics Concern  . Not on file  Social History Narrative  . Not on file   Social Determinants of Health   Financial Resource Strain:   . Difficulty of Paying Living Expenses: Not on file  Food Insecurity:   . Worried About Charity fundraiser in the Last Year: Not on file  . Ran Out of Food in the Last Year: Not on file  Transportation Needs:   . Lack of Transportation  (Medical): Not on file  . Lack of Transportation (Non-Medical): Not on file  Physical Activity:   . Days of Exercise per Week: Not on file  . Minutes of Exercise per Session: Not on file  Stress:   . Feeling of Stress : Not on file  Social Connections:   . Frequency of Communication with Friends and Family: Not on file  . Frequency of Social Gatherings with Friends and Family: Not on file  . Attends Religious Services: Not on file  . Active Member of Clubs or Organizations: Not on file  . Attends Archivist Meetings: Not on file  . Marital Status: Not on file  Intimate Partner Violence:   . Fear of Current or Ex-Partner: Not on file  . Emotionally Abused: Not on file  . Physically Abused: Not on file  . Sexually Abused: Not on file    Review of Systems  Constitutional: Negative for fatigue.  Eyes: Negative for visual disturbance.  Respiratory: Negative for cough, chest tightness and shortness of breath.   Cardiovascular: Negative for chest pain, palpitations and leg swelling.  Gastrointestinal: Negative for abdominal pain and blood in stool.  Neurological: Negative for dizziness, syncope, facial asymmetry, speech difficulty, weakness, light-headedness and headaches.     Objective:   Vitals:   03/04/20 0839  BP: (!) 162/88  Pulse: 72  Temp: (!) 97.3 F (36.3 C)  TempSrc: Temporal  SpO2: 98%  Weight: 171 lb (77.6 kg)  Height: 5' 9" (1.753 m)     Physical Exam Vitals reviewed.  Constitutional:      Appearance: He is well-developed.  HENT:     Head: Normocephalic and atraumatic.  Eyes:     Pupils: Pupils are equal, round, and reactive to light.  Neck:     Vascular: No carotid bruit or JVD.  Cardiovascular:     Rate and Rhythm: Normal rate and regular rhythm.     Heart sounds: Normal heart sounds. No murmur heard.   Pulmonary:     Effort: Pulmonary effort is normal.     Breath sounds: Normal breath sounds. No rales.  Skin:    General: Skin is warm  and dry.  Neurological:     Mental Status: He is alert and oriented to person, place, and time.        Assessment & Plan:  [name] is a 79 y.o. male . Labile hypertension  -History of labile hypertension, hypotension past few months which appears to be improved at this time.  Asymptomatic even with some borderline low readings previously.  Based on home readings, decided to continue same regimen for now, keep follow-up with cardiology as planned.  If he does have return of BPH with LUTS symptoms, I would recommend low-dose doxazosin, potentially 2 to 4 mg, not 8.  RTC precautions given.  Anxiety state - Plan: escitalopram (LEXAPRO) 20 MG tablet Depression with anxiety  -Stable, continue same dose Lexapro.  42-monthprescription  requested, sent as above.  Meds ordered this encounter  Medications  . escitalopram (LEXAPRO) 20 MG tablet    Sig: Take 1 tablet (20 mg total) by mouth daily.    Dispense:  180 tablet    Refill:  1   Patient Instructions    Although blood pressure slightly elevated in office, overall your home readings appear to be okay on the current regimen.  There is some variability, but I am hesitant to change medications at this time.  Keep follow-up with Dr. HDebara Pickettas planned.  If you do have more lower urinary symptoms, I would recommend starting on very low-dose doxazosin, potentially 2 or 4 mg, not 8 mg.  Follow-up with me in 3 months, but let me know if there are questions sooner and take care.    If you have lab work done today you will be contacted with your lab results within the next 2 weeks.  If you have not heard from [place] please contact [place] The fastest way to get your results is to register for My Chart.   IF you received an x-ray today, you will receive an invoice from [facility]. Please contact [facility] at [phone] questions or concerns regarding your invoice.   IF you received labwork today, you will receive an  invoice from [place] Please contact [facility] at [phone] questions or concerns regarding your invoice.   Our billing staff will not be able to assist you with questions regarding bills from these companies.  You will be contacted with the lab results as soon as they are available. The fastest way to get your results is to activate your My Chart account. Instructions are located on the last page of this paperwork. If you have not heard from [place]arding the results in 2 weeks, please contact this office.         Signed, JMerri Ray MD [facility]

## 2020-03-04 NOTE — Telephone Encounter (Signed)
Pt requested change of pharmacy

## 2020-03-05 DIAGNOSIS — R630 Anorexia: Secondary | ICD-10-CM | POA: Diagnosis not present

## 2020-03-05 DIAGNOSIS — R1319 Other dysphagia: Secondary | ICD-10-CM | POA: Diagnosis not present

## 2020-03-06 DIAGNOSIS — R1319 Other dysphagia: Secondary | ICD-10-CM | POA: Diagnosis not present

## 2020-03-06 DIAGNOSIS — R630 Anorexia: Secondary | ICD-10-CM | POA: Diagnosis not present

## 2020-03-07 ENCOUNTER — Other Ambulatory Visit: Payer: Self-pay

## 2020-03-07 ENCOUNTER — Ambulatory Visit: Payer: Medicare HMO | Admitting: Internal Medicine

## 2020-03-07 ENCOUNTER — Encounter: Payer: Self-pay | Admitting: Internal Medicine

## 2020-03-07 VITALS — BP 164/104 | HR 67 | Ht 69.0 in | Wt 176.4 lb

## 2020-03-07 DIAGNOSIS — R0989 Other specified symptoms and signs involving the circulatory and respiratory systems: Secondary | ICD-10-CM

## 2020-03-07 DIAGNOSIS — R1319 Other dysphagia: Secondary | ICD-10-CM | POA: Diagnosis not present

## 2020-03-07 DIAGNOSIS — R0600 Dyspnea, unspecified: Secondary | ICD-10-CM

## 2020-03-07 DIAGNOSIS — R630 Anorexia: Secondary | ICD-10-CM | POA: Diagnosis not present

## 2020-03-07 DIAGNOSIS — E782 Mixed hyperlipidemia: Secondary | ICD-10-CM

## 2020-03-07 DIAGNOSIS — R0609 Other forms of dyspnea: Secondary | ICD-10-CM

## 2020-03-07 NOTE — Patient Instructions (Signed)

## 2020-03-07 NOTE — Progress Notes (Signed)
OFFICE CONSULT NOTE  Chief Complaint:  Shortness of breath  Primary Care Physician: [name], MD  HPI:  [name] is a 79 y.o. male who is being seen today for the evaluation of shortness of breath at the request of [name], MD.  This is a pleasant 79 year old male kindly referred for evaluation of shortness of breath.  He notes longstanding nasal congestion in which she has been followed by ENT.  He is used a number of different treatments for this however was felt not to be as good surgery candidate.  He says the shortness of breath he thinks is primarily due to nasal congestion and not being able to breathe through his mouth.  He notes when he does certain activities he becomes more short of breath.  He denies any chest pain or any significant decline in exertional activity.  Although he struggles with back pain, he noted he was recently able to shovel many yards of mulch and move that with a wheelbarrow as well as lift large bags of feed.  He has a history of dyslipidemia however well controlled.  Total cholesterol 123, triglycerides 110, HDL 65 and LDL 38 on simvastatin.  There is hypertension but that is also well controlled today 114/66.  EKG shows a sinus rhythm with first-degree AV block and borderline prolonged QT but no ischemia.  03/07/2020  Mr. Sigmund seen today in follow-up.  He recently called in the office with concerns about labile blood pressures.  After reviewing his chart it seems that he has multiple providers that are addressing this including his urologist, Dr. Jeffie Pollock.  A nephrologist and his primary care provider Dr. Nyoka Cowden.  He has multiple follow-ups already scheduled with Dr. Nyoka Cowden.  Blood pressure recently was low after treatment and after backing off on medications now is having issues with high blood pressures suggestive of labile hypertension.  Blood pressure today in the office 164/104.  I had seen him for shortness of breath but he still feels  that this is related to a deviated septum.  He denies any chest pain or exertional dyspnea per se.  He has had prior radical neck dissection and radiation in the past.  He is also had spinal surgeries I believe with Dr. Tonita Cong.  PMHx:  Past Medical History:  Diagnosis Date  . Anxiety   . Arthritis   . BPH (benign prostatic hyperplasia)   . Bruises easily   . Cancer (HCC)    throat  . Complication of anesthesia    difficult urination after anesthesia  . Dizziness   . GERD (gastroesophageal reflux disease)   . Glaucoma    left worse than right  . H/O hiatal hernia   . Heart murmur   . Hyperlipemia   . Hypertension    takes meds daily  . Hypothyroidism   . Transient alteration of awareness    ~ 12/27/17, evaluated by neurologist Dr. Felecia Shelling 01/06/18  . Urinary retention     Past Surgical History:  Procedure Laterality Date  . ANTERIOR LAT LUMBAR FUSION  03/11/2012   Procedure: ANTERIOR LATERAL LUMBAR FUSION 1 LEVEL;  Surgeon: Charlie Pitter, MD;  Location: [facility];  Service: Neurosurgery;  Laterality: Left;  Left lumbar four-five extreme lumbar interbody fusion with percutaneous pedicle screws   . BACK SURGERY     ruptured disc repair  . COLONOSCOPY W/ POLYPECTOMY    . EYE SURGERY     left cataract  . HERNIA REPAIR    .  INGUINAL HERNIA REPAIR Right 05/22/2014   Procedure: REPAIR RECURRENT RIGHT INGUINAL HERNIA;  Surgeon: Doreen Salvage, MD;  Location: [facility];  Service: General;  Laterality: Right;  . INSERTION OF MESH Right 05/22/2014   Procedure: INSERTION OF MESH;  Surgeon: Doreen Salvage, MD;  Location: [facility];  Service: General;  Laterality: Right;  . LUMBAR LAMINECTOMY/DECOMPRESSION MICRODISCECTOMY Left 01/11/2018   Procedure: Laminectomy and Foraminotomy - Left - Lumbar Three-Lumbar Four - Lumbar Five-Sacral One;  Surgeon: Earnie Larsson, MD;  Location: [facility];  Service: Neurosurgery;  Laterality: Left;  Laminectomy and Foraminotomy - Left - Lumbar Three-Lumbar Four - Lumbar Five-Sacral One    . LUMBAR PERCUTANEOUS PEDICLE SCREW 1 LEVEL  03/11/2012   Procedure: LUMBAR PERCUTANEOUS PEDICLE SCREW 1 LEVEL;  Surgeon: Charlie Pitter, MD;  Location: [facility];  Service: Neurosurgery;  Laterality: Left;  Left lumbar four-five extreme lumbar interbody fusion with percutaneous pedicle screws   . RADICAL NECK DISSECTION  1989  . ROTATOR CUFF REPAIR     right  . TONSILLECTOMY      FAMHx:  No family history on file.  No significant early onset coronary disease in the family.  SOCHx:   reports that he has quit smoking. He has never used smokeless tobacco. He reports current alcohol use. He reports that he does not use drugs.  ALLERGIES:  Allergies  Allergen Reactions  . Amlodipine Other (See Comments)    Swollen gums Swollen gums   . Bimatoprost Other (See Comments)    Toxicity Toxicity   . Latanoprost Other (See Comments)    Toxicity Toxicity   . Brimonidine Tartrate-Timolol Other (See Comments)    Toxicity  . Codeine Nausea Only    ROS: Pertinent items noted in HPI and remainder of comprehensive ROS otherwise negative.  HOME MEDS: Current Outpatient Medications on File Prior to Visit  Medication Sig Dispense Refill  . Difluprednate 0.05 % EMUL Place 1 drop into the right eye 3 times daily. Starting the day after surgery.    . dorzolamide-timolol (COSOPT) 22.3-6.8 MG/ML ophthalmic solution Place 1 drop into both eyes 2 (two) times daily.    Marland Kitchen doxazosin (CARDURA) 8 MG tablet Take by mouth.    . escitalopram (LEXAPRO) 20 MG tablet Take 1 tablet (20 mg total) by mouth daily. 180 tablet 1  . fluocinonide cream (LIDEX) 2.22 % Apply 1 application topically 2 (two) times daily. 30 g 0  . fluticasone (FLONASE) 50 MCG/ACT nasal spray Place 1 spray into both nostrils daily. (Patient taking differently: Place 1 spray into both nostrils daily as needed for allergies. ) 16 g 6  . furosemide (LASIX) 20 MG tablet TAKE ONE TABLET BY MOUTH DAILY 90 tablet 0  . ipratropium (ATROVENT)  0.03 % nasal spray Place 1 spray into both nostrils 2 (two) times daily as needed for rhinitis. 30 mL 5  . ipratropium (ATROVENT) 0.06 % nasal spray Place 2 sprays into both nostrils 3 (three) times daily.    Marland Kitchen levothyroxine (SYNTHROID) 50 MCG tablet TAKE ONE TABLET BY MOUTH DAILY 90 tablet 0  . losartan (COZAAR) 100 MG tablet TAKE ONE TABLET BY MOUTH DAILY 90 tablet 1  . omeprazole (PRILOSEC) 20 MG capsule Take 1 capsule (20 mg total) by mouth every other day. IN THE MORNING 90 capsule 1  . simvastatin (ZOCOR) 20 MG tablet Take 1 tablet (20 mg total) by mouth every evening. 90 tablet 3  . Tafluprost 0.0015 % SOLN Place 1 drop into both eyes at bedtime.  ZIOPTAN (tafluprost)    . aspirin 81 MG tablet Take 81 mg by mouth at bedtime. (Patient not taking: Reported on 03/07/2020)     No current facility-administered medications on file prior to visit.    LABS/IMAGING: No results found for this or any previous visit (from the past 48 hour(s)). No results found.  LIPID PANEL:    Component Value Date/Time   CHOL 127 10/27/2019 1108   TRIG 93 10/27/2019 1108   HDL 67 10/27/2019 1108   CHOLHDL 1.9 10/27/2019 1108   LDLCALC 43 10/27/2019 1108    WEIGHTS: Wt Readings from Last 3 Encounters:  03/07/20 176 lb 6.4 oz (80 kg)  03/04/20 171 lb (77.6 kg)  02/07/20 175 lb 3.2 oz (79.5 kg)    VITALS: BP (!) 164/104   Pulse 67   Ht 5\' 9"  (1.753 m)   Wt 176 lb 6.4 oz (80 kg)   SpO2 99%   BMI 26.05 kg/m   EXAM: General appearance: alert and no distress Neck: no carotid bruit, no JVD and thyroid not enlarged, symmetric, no tenderness/mass/nodules Lungs: clear to auscultation bilaterally Heart: regular rate and rhythm, S1, S2 normal, no murmur, click, rub or gallop Abdomen: soft, non-tender; bowel sounds normal; no masses,  no organomegaly Extremities: extremities normal, atraumatic, no cyanosis or edema Pulses: 2+ and symmetric Skin: Skin color, texture, turgor normal. No rashes or  lesions Neurologic: Grossly normal Psych: Pleasant  EKG: Normal sinus rhythm at 67-personally reviewed  ASSESSMENT: 1. Dyspnea on exertion -improved 2. Labile hypertension 3. Dyslipidemia-at goal  PLAN: 1.   Mr. Bonura has recently had issues with very labile hypertension.  Blood pressure is significantly elevated today however he said was quite hypotensive requiring decreases or removing some medications over the past month.  I wonder if this could be autonomically related.  He has had prior spinal surgeries.  He also said that he had radical neck dissection and radiation to the neck.  He has been seeing nephrology and urology and said he had a recent contrast CT of the abdomen.  I would like to review these results to see if there is any evidence of any renal artery stenosis.  I do not see clear indication to consider stress testing as he is asymptomatic however cannot clearly explain why he is having labile blood pressures.  He should continue to record them at home but I am hesitant to adjust the medication since several providers are already making medication changes with regards to this.  If there is need for further evaluation such as renal Dopplers or other evaluation for labile hypertension based on my review of his studies I will contact him.  Otherwise follow-up with me as needed.  Pixie Casino, MD, [facility], [place] Director of the [facility]  [facility] Diplomate of the American Board of Clinical Lipidology Attending Cardiologist  Direct Dial: [phone]  Fax: [phone]  Website:  [email or web address] [email or web address] 03/07/2020, 12:02 PM

## 2020-03-13 DIAGNOSIS — R630 Anorexia: Secondary | ICD-10-CM | POA: Diagnosis not present

## 2020-03-13 DIAGNOSIS — R1319 Other dysphagia: Secondary | ICD-10-CM | POA: Diagnosis not present

## 2020-03-14 DIAGNOSIS — R1319 Other dysphagia: Secondary | ICD-10-CM | POA: Diagnosis not present

## 2020-03-14 DIAGNOSIS — R630 Anorexia: Secondary | ICD-10-CM | POA: Diagnosis not present

## 2020-03-19 DIAGNOSIS — R1319 Other dysphagia: Secondary | ICD-10-CM | POA: Diagnosis not present

## 2020-03-20 DIAGNOSIS — K458 Other specified abdominal hernia without obstruction or gangrene: Secondary | ICD-10-CM | POA: Diagnosis not present

## 2020-03-21 DIAGNOSIS — R1319 Other dysphagia: Secondary | ICD-10-CM | POA: Diagnosis not present

## 2020-03-22 DIAGNOSIS — H524 Presbyopia: Secondary | ICD-10-CM | POA: Diagnosis not present

## 2020-03-22 DIAGNOSIS — H5201 Hypermetropia, right eye: Secondary | ICD-10-CM | POA: Diagnosis not present

## 2020-03-22 DIAGNOSIS — Z961 Presence of intraocular lens: Secondary | ICD-10-CM | POA: Diagnosis not present

## 2020-03-22 DIAGNOSIS — H52223 Regular astigmatism, bilateral: Secondary | ICD-10-CM | POA: Diagnosis not present

## 2020-03-22 DIAGNOSIS — H2511 Age-related nuclear cataract, right eye: Secondary | ICD-10-CM | POA: Diagnosis not present

## 2020-03-22 DIAGNOSIS — H401112 Primary open-angle glaucoma, right eye, moderate stage: Secondary | ICD-10-CM | POA: Diagnosis not present

## 2020-04-03 DIAGNOSIS — H60542 Acute eczematoid otitis externa, left ear: Secondary | ICD-10-CM | POA: Diagnosis not present

## 2020-04-03 DIAGNOSIS — R1319 Other dysphagia: Secondary | ICD-10-CM | POA: Diagnosis not present

## 2020-04-08 DIAGNOSIS — H401112 Primary open-angle glaucoma, right eye, moderate stage: Secondary | ICD-10-CM | POA: Diagnosis not present

## 2020-04-08 DIAGNOSIS — H2511 Age-related nuclear cataract, right eye: Secondary | ICD-10-CM | POA: Diagnosis not present

## 2020-04-15 DIAGNOSIS — Z85828 Personal history of other malignant neoplasm of skin: Secondary | ICD-10-CM | POA: Diagnosis not present

## 2020-04-15 DIAGNOSIS — D1801 Hemangioma of skin and subcutaneous tissue: Secondary | ICD-10-CM | POA: Diagnosis not present

## 2020-04-15 DIAGNOSIS — L570 Actinic keratosis: Secondary | ICD-10-CM | POA: Diagnosis not present

## 2020-04-15 DIAGNOSIS — L821 Other seborrheic keratosis: Secondary | ICD-10-CM | POA: Diagnosis not present

## 2020-04-15 DIAGNOSIS — L300 Nummular dermatitis: Secondary | ICD-10-CM | POA: Diagnosis not present

## 2020-04-15 DIAGNOSIS — L820 Inflamed seborrheic keratosis: Secondary | ICD-10-CM | POA: Diagnosis not present

## 2020-04-15 DIAGNOSIS — D224 Melanocytic nevi of scalp and neck: Secondary | ICD-10-CM | POA: Diagnosis not present

## 2020-04-15 DIAGNOSIS — D235 Other benign neoplasm of skin of trunk: Secondary | ICD-10-CM | POA: Diagnosis not present

## 2020-04-22 ENCOUNTER — Ambulatory Visit: Payer: Medicare HMO | Admitting: Family Medicine

## 2020-04-22 ENCOUNTER — Other Ambulatory Visit: Payer: Self-pay | Admitting: Family Medicine

## 2020-04-22 DIAGNOSIS — E785 Hyperlipidemia, unspecified: Secondary | ICD-10-CM

## 2020-04-25 ENCOUNTER — Ambulatory Visit: Payer: Medicare HMO | Admitting: Family Medicine

## 2020-05-02 ENCOUNTER — Ambulatory Visit: Payer: Medicare HMO | Admitting: Family Medicine

## 2020-05-07 ENCOUNTER — Ambulatory Visit: Payer: Self-pay | Admitting: General Surgery

## 2020-05-07 DIAGNOSIS — K432 Incisional hernia without obstruction or gangrene: Secondary | ICD-10-CM | POA: Diagnosis not present

## 2020-05-07 NOTE — H&P (Signed)
  History of Present Illness Derek Filler MD; 05/07/2020 9:44 AM) The patient is a 80 year old male who presents with an incisional hernia. Patient is a 80 year old male, who comes in secondary to a left flank incisional hernia. Patient has a history of hypertension, hyperlipidemia. Patient states he had a left lumbar surgery by Dr. Dutch Quint. He states that approximately 6-8 months after surgery noticeable also left flank area. Patient underwent CT scan which did reveal a left flank hernia. I did review the CT scan personally. This appears to have retro-peritoneal fat herniating through. Patient does state he has some discomfort and pain to the left side when the laser moved to the left side. Patient otherwise transferrin active.  Patient had previous open umbilical and bilateral inguinal hernia repairs by Dr. Lindie Spruce.     Allergies (Kheana Marshall-McBride, CMA; 05/07/2020 9:26 AM) Codeine/Codeine Derivatives  AmLODIPine & Diet Manage Prod *CALCIUM CHANNEL BLOCKERS*  Bimatoprost *CHEMICALS*  Latanoprost *CHEMICALS*  Brimonidine Tartrate *CHEMICALS*  Allergies Reconciled   Medication History (Kheana Marshall-McBride, CMA; 05/07/2020 9:26 AM) Lexapro (20MG  Tablet, Oral) Active. Furosemide (20MG  Tablet, Oral) Active. Ipratropium Bromide (0.06% Solution, Nasal) Active. Losartan Potassium (100MG  Tablet, Oral) Active. Amoxicillin (500MG  Capsule, Oral) Active. Fluticasone Propionate (50MCG/ACT Suspension, Nasal) Active. Levothyroxine Sodium ( Tablet, Oral) Active. Omeprazole (20MG  Capsule DR, Oral) Active. Simvastatin (20MG  Tablet, Oral) Active. CloNIDine HCl (0.2MG /24HR Patch Weekly, Transdermal) Active. Dorzolamide HCl-Timolol Mal (22.3-6.8MG /ML Solution, Ophthalmic) Active. Ipratropium Bromide (0.03% Solution, Nasal) Active. Zioptan (0.0015% Solution, Ophthalmic) Active. Medications Reconciled    Review of Systems , MD; 05/07/2020 9:46 AM) All  other systems negative  Vitals (Kheana Marshall-McBride CMA; 05/07/2020 9:27 AM) 05/07/2020 9:26 AM Weight: 172.5 lb Height: 69in Body Surface Area: 1.94 m Body Mass Index: 25.47 kg/m  Temp.: 97.64F  Pulse: 84 (Regular)  P.OX: 94% (Room air) BP: 162/88(Sitting, Left Arm, Standard)       Physical Exam MD; 05/07/2020 9:45 AM) The physical exam findings are as follows: Note: Constitutional: No acute distress, conversant, appears stated age  Eyes: Anicteric sclerae, moist conjunctiva, no lid lag  Neck: No thyromegaly, trachea midline, no cervical lymphadenopathy  Lungs: Clear to auscultation biilaterally, normal respiratory effot  Cardiovascular: regular rate & rhythm, no murmurs, no peripheal edema, pedal pulses 2+  GI: Soft, no masses or hepatosplenomegaly, non-tender to palpation  MSK: Normal gait, no clubbing cyanosis, edema  Skin: No rashes, palpation reveals normal skin turgor  Psychiatric: Appropriate judgment and insight, oriented to person, place, and time  Abdomen Inspection Hernias - Incisional - Reducible (Left flank incision with hernia.) .    Assessment & Plan Derek Filler MD; 05/07/2020 9:45 AM) 07/05/2020 HERNIA, WITHOUT OBSTRUCTION OR GANGRENE (K43.2) Impression: Patient is a 80 year old male, with history of hypertension, with left incisional flank hernia.  1. The patient will like to proceed to the operating room for robotic left flank hernia repair with mesh.  2. I discussed with the patient the signs and symptoms of incarceration and strangulation and the need to proceed to the ER should they occur.  3. I discussed with the patient the risks and benefits of the procedure to include but not limited to: Infection, bleeding, damage to surrounding structures, possible need for further surgery, possible nerve pain, and possible recurrence. The patient was understanding and wishes to proceed.

## 2020-05-13 ENCOUNTER — Encounter: Payer: Self-pay | Admitting: Family Medicine

## 2020-05-13 ENCOUNTER — Other Ambulatory Visit: Payer: Self-pay

## 2020-05-13 ENCOUNTER — Ambulatory Visit (INDEPENDENT_AMBULATORY_CARE_PROVIDER_SITE_OTHER): Payer: Medicare HMO | Admitting: Family Medicine

## 2020-05-13 VITALS — BP 128/75 | HR 76 | Temp 98.0°F | Ht 69.0 in | Wt 171.0 lb

## 2020-05-13 DIAGNOSIS — E785 Hyperlipidemia, unspecified: Secondary | ICD-10-CM | POA: Diagnosis not present

## 2020-05-13 DIAGNOSIS — R0981 Nasal congestion: Secondary | ICD-10-CM | POA: Diagnosis not present

## 2020-05-13 DIAGNOSIS — R69 Illness, unspecified: Secondary | ICD-10-CM | POA: Diagnosis not present

## 2020-05-13 DIAGNOSIS — F411 Generalized anxiety disorder: Secondary | ICD-10-CM

## 2020-05-13 DIAGNOSIS — J029 Acute pharyngitis, unspecified: Secondary | ICD-10-CM

## 2020-05-13 DIAGNOSIS — I10 Essential (primary) hypertension: Secondary | ICD-10-CM

## 2020-05-13 DIAGNOSIS — E039 Hypothyroidism, unspecified: Secondary | ICD-10-CM

## 2020-05-13 MED ORDER — SIMVASTATIN 20 MG PO TABS: 20.0000 mg | ORAL_TABLET | Freq: Every evening | ORAL | 3 refills | Status: DC

## 2020-05-13 MED ORDER — LOSARTAN POTASSIUM 100 MG PO TABS: 100.0000 mg | ORAL_TABLET | Freq: Every day | ORAL | 1 refills | Status: DC

## 2020-05-13 MED ORDER — DOXAZOSIN MESYLATE 4 MG PO TABS: 4.0000 mg | ORAL_TABLET | Freq: Every day | ORAL | 1 refills | Status: DC

## 2020-05-13 MED ORDER — LEVOTHYROXINE SODIUM 50 MCG PO TABS: 50.0000 ug | ORAL_TABLET | Freq: Every day | ORAL | 1 refills | Status: DC

## 2020-05-13 MED ORDER — ESCITALOPRAM OXALATE 20 MG PO TABS: 20.0000 mg | ORAL_TABLET | Freq: Every day | ORAL | 1 refills | Status: DC

## 2020-05-13 NOTE — Progress Notes (Signed)
Subjective:  Patient ID: [name], male    DOB: Mar 07, 1941  Age: 80 y.o. MRN: [id number]  CC:  Chief Complaint  Patient presents with  . Follow-up    On hypertensin, hypothyroidism, and anxiety. PT reports since last OV. Pt states BP at home has been running around today's office reading ov 128/75 sometimes a little higher, but not by much. Pt has noticed some weight loss, but states he thinks it's from the therapy he has been through. Pt states no unexplained fatigue. No changes to anxeity since last OV.  . Sinus Problem    Pt reports some congestion/soreness mainly on the L side of his sinuses. Pt also reports a sore throat. No other symptoms to report. No known exposure to covid.    HPI [name] presents for   Sore throat, nasal congestion: History of allergic rhinitis, treated with Flonase, Atrovent nasal spray previously. Last few days sore throat, nasal congestion. No cough/dyspnea. Some PND, with some blood streaks. No hemoptysis. No n/v/d. No fever. Sx's started 05/09/20. Min fatigue. Drinking OK. Treated for dysphagia, drinking fluids with recent illness.  Does not feel like allergies. Has been using ipratropium BID.  No sick contacts known, including covid 19 known exposure.  Had covid vaccine, and booster.  No covid test. Would be interested in antivirals/monoclonal AB if covid positive.    GERD Omeprazole 20 mg daily as needed. No recent use in past month - no breakthrough symptoms. No hx of PUD.   Hypertension: History of labile hypertension followed by cardiology, Dr. Rennis Golden, appointment November 4.  Based on prior hypotensive episodes medications were left the same.  He continues to take losartan 100 mg daily, doxazosin 4 mg daily? Lasix 20 mg daily. Home readings: 120-30/60-70 usually, rare elevation.  BP Readings from Last 3 Encounters:  05/13/20 128/75  03/07/20 (!) 164/104  03/04/20 (!) 162/88   Lab Results  Component Value Date   CREATININE 1.17  10/27/2019   Anxiety: Stable with lexapro.  Depression screen [facility] 2/9 05/13/2020 03/04/2020 02/07/2020 01/30/2020 10/27/2019  Decreased Interest 0 0 0 0 0  Down, Depressed, Hopeless 0 0 0 0 0  PHQ - 2 Score 0 0 0 0 0     Hyperlipidemia: Simvastatin 20 mg daily, no new myalgias/side effects.  Lab Results  Component Value Date   CHOL 127 10/27/2019   HDL 67 10/27/2019   LDLCALC 43 10/27/2019   TRIG 93 10/27/2019   CHOLHDL 1.9 10/27/2019   Lab Results  Component Value Date   ALT 10 10/27/2019   AST 14 10/27/2019   ALKPHOS 77 10/27/2019   BILITOT 0.6 10/27/2019    Hypothyroidism: Lab Results  Component Value Date   TSH 5.370 (H) 10/27/2019   Taking medication daily.  Synthroid 50 mcg daily.  Borderline TSH in June 2021 as above. No new hot or cold intolerance. No new hair or skin changes, heart palpitations or new fatigue.  Has lost weight d/t dysphagia, with prior head/neck XRT.     History Patient Active Problem List   Diagnosis Date Noted  . Lumbar radiculopathy 01/11/2018  . Transient alteration of awareness 01/06/2018  . Abnormal head CT 01/06/2018  . Hypertension 09/18/2016  . Benign essential hypertension 11/18/2015  . Acquired hypothyroidism 08/30/2015  . 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 Hypercholesteremia 08/30/2015  . 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 Anxiety   . Arthritis   .  BPH (benign prostatic hyperplasia)   . Bruises easily   . Cancer (HCC)    throat  . Complication of anesthesia    difficult urination after anesthesia  . Dizziness   . GERD (gastroesophageal reflux disease)   . Glaucoma    left worse than right  . H/O hiatal hernia   . Heart murmur   . Hyperlipemia   . Hypertension    takes meds daily  . Hypothyroidism   . Transient alteration of awareness    ~ 12/27/17, evaluated by neurologist Dr. Felecia Shelling 01/06/18  . Urinary retention    Past Surgical History:   Procedure Laterality Date  . ANTERIOR LAT LUMBAR FUSION  03/11/2012   Procedure: ANTERIOR LATERAL LUMBAR FUSION 1 LEVEL;  Surgeon: Charlie Pitter, MD;  Location: [facility];  Service: Neurosurgery;  Laterality: Left;  Left lumbar four-five extreme lumbar interbody fusion with percutaneous pedicle screws   . BACK SURGERY     ruptured disc repair  . COLONOSCOPY W/ POLYPECTOMY    . EYE SURGERY     left cataract  . HERNIA REPAIR    . INGUINAL HERNIA REPAIR Right 05/22/2014   Procedure: REPAIR RECURRENT RIGHT INGUINAL HERNIA;  Surgeon: Doreen Salvage, MD;  Location: [facility];  Service: General;  Laterality: Right;  . INSERTION OF MESH Right 05/22/2014   Procedure: INSERTION OF MESH;  Surgeon: Doreen Salvage, MD;  Location: [facility];  Service: General;  Laterality: Right;  . LUMBAR LAMINECTOMY/DECOMPRESSION MICRODISCECTOMY Left 01/11/2018   Procedure: Laminectomy and Foraminotomy - Left - Lumbar Three-Lumbar Four - Lumbar Five-Sacral One;  Surgeon: Earnie Larsson, MD;  Location: [place];  Service: Neurosurgery;  Laterality: Left;  Laminectomy and Foraminotomy - Left - Lumbar Three-Lumbar Four - Lumbar Five-Sacral One  . LUMBAR PERCUTANEOUS PEDICLE SCREW 1 LEVEL  03/11/2012   Procedure: LUMBAR PERCUTANEOUS PEDICLE SCREW 1 LEVEL;  Surgeon: Charlie Pitter, MD;  Location: [facility];  Service: Neurosurgery;  Laterality: Left;  Left lumbar four-five extreme lumbar interbody fusion with percutaneous pedicle screws   . RADICAL NECK DISSECTION  1989  . ROTATOR CUFF REPAIR     right  . TONSILLECTOMY     Allergies  Allergen Reactions  . Amlodipine Other (See Comments)    Swollen gums Swollen gums   . Bimatoprost Other (See Comments)    Toxicity Toxicity   . Latanoprost Other (See Comments)    Toxicity Toxicity   . Brimonidine Tartrate-Timolol Other (See Comments)    Toxicity  . Codeine Nausea Only   Prior to Admission medications   Medication Sig Start Date End Date Taking? Authorizing Provider  aspirin 81 MG  tablet Take 81 mg by mouth at bedtime.   Yes [provider]  Difluprednate 0.05 % EMUL Place 1 drop into the right eye 3 times daily. Starting the day after surgery. 12/06/19  Yes [provider]  dorzolamide-timolol (COSOPT) 22.3-6.8 MG/ML ophthalmic solution Place 1 drop into both eyes 2 (two) times daily.   Yes [provider]  doxazosin (CARDURA) 8 MG tablet Take by mouth. 05/07/18  Yes [provider]  escitalopram (LEXAPRO) 20 MG tablet Take 1 tablet (20 mg total) by mouth daily. 03/04/20  Yes [name], MD  fluocinonide cream (LIDEX) [id number] % Apply 1 application topically 2 (two) times daily. 03/09/18  Yes [name], MD  fluticasone (FLONASE) 50 MCG/ACT nasal spray Place 1 spray into both nostrils daily. Patient taking differently: Place 1 spray into both nostrils daily  as needed for allergies. 09/02/17  Yes [name], MD  furosemide (LASIX) 20 MG tablet TAKE ONE TABLET BY MOUTH DAILY 02/20/20  Yes [name], MD  ipratropium (ATROVENT) 0.06 % nasal spray Place 2 sprays into both nostrils 3 (three) times daily. 02/16/20  Yes [provider]  levothyroxine (SYNTHROID) 50 MCG tablet TAKE ONE TABLET BY MOUTH DAILY 02/20/20  Yes [name], MD  losartan (COZAAR) 100 MG tablet TAKE ONE TABLET BY MOUTH DAILY 12/17/19  Yes [name], MD  omeprazole (PRILOSEC) 20 MG capsule Take 1 capsule (20 mg total) by mouth every other day. IN THE MORNING 04/20/19  Yes [name], MD  simvastatin (ZOCOR) 20 MG tablet TAKE ONE TABLET BY MOUTH EVERY EVENING 04/22/20  Yes [name], MD  Tafluprost 0.0015 % SOLN Place 1 drop into both eyes at bedtime. ZIOPTAN (tafluprost) 09/15/16  Yes [provider]  ipratropium (ATROVENT) 0.03 % nasal spray Place 1 spray into both nostrils 2 (two) times daily as needed for rhinitis. 09/02/17   [name], MD   Social History   Socioeconomic History  . Marital status:  Married    Spouse name: Not on file  . Number of children: Not on file  . Years of education: Not on file  . Highest education level: Not on file  Occupational History  . Not on file  Tobacco Use  . Smoking status: Former Research scientist (life sciences)  . Smokeless tobacco: Never Used  Vaping Use  . Vaping Use: Never used  Substance and Sexual Activity  . Alcohol use: Yes    Comment: 1 or 2 beers a week  . Drug use: No  . Sexual activity: Not Currently  Other Topics Concern  . Not on file  Social History Narrative  . Not on file   Social Determinants of Health   Financial Resource Strain: Not on file  Food Insecurity: Not on file  Transportation Needs: Not on file  Physical Activity: Not on file  Stress: Not on file  Social Connections: Not on file  Intimate Partner Violence: Not on file    Review of Systems  Constitutional: Negative for fatigue and unexpected weight change.  Eyes: Negative for visual disturbance.  Respiratory: Negative for cough, chest tightness and shortness of breath.   Cardiovascular: Negative for chest pain, palpitations and leg swelling.  Gastrointestinal: Negative for abdominal pain and blood in stool.  Neurological: Negative for dizziness, light-headedness and headaches.     Objective:   Vitals:   05/13/20 1021  BP: 128/75  Pulse: 76  Temp: 98 F (36.7 C)  TempSrc: Temporal  SpO2: 100%  Weight: 171 lb (77.6 kg)  Height: 5\' 9"  (1.753 m)     Physical Exam Vitals reviewed.  Constitutional:      Appearance: He is well-developed and well-nourished.  HENT:     Head: Normocephalic and atraumatic.     Nose: No congestion or rhinorrhea.     Comments: No bleeding or site of prior bleeding seen.     Mouth/Throat:     Mouth: Mucous membranes are dry.     Pharynx: No oropharyngeal exudate or posterior oropharyngeal erythema.  Eyes:     Extraocular Movements: EOM normal.     Pupils: Pupils are equal, round, and reactive to light.  Neck:     Vascular: No  carotid bruit or JVD.  Cardiovascular:     Rate and Rhythm: Normal rate and regular rhythm.  Heart sounds: Normal heart sounds. No murmur heard.   Pulmonary:     Effort: Pulmonary effort is normal.     Breath sounds: Normal breath sounds. No rales.  Musculoskeletal:        General: No edema.  Skin:    General: Skin is warm and dry.  Neurological:     Mental Status: He is alert and oriented to person, place, and time.  Psychiatric:        Mood and Affect: Mood and affect and mood normal.        Behavior: Behavior normal.        Assessment & Plan:  [name] is a 80 y.o. male . Sore throat - Plan: COVID-19, Flu A+B and RSV Nasal congestion - Plan: COVID-19, Flu A+B and RSV  - reassuring exam. Check covid, flu rsv testing. Sx care with rtc precautions. Isolation precautions.   Hypothyroidism, unspecified type - Plan: TSH, levothyroxine (SYNTHROID) 50 MCG tablet  -  Stable, tolerating current regimen. Medications refilled. Labs pending as above.   Essential hypertension - Plan: Comprehensive metabolic panel, losartan (COZAAR) 100 MG tablet  -  Stable, tolerating current regimen. Medications refilled. Labs pending as above.  Hyperlipidemia, unspecified hyperlipidemia type - Plan: Comprehensive metabolic panel, Lipid panel, simvastatin (ZOCOR) 20 MG tablet  -  Stable, tolerating current regimen. Medications refilled. Labs pending as above.   Anxiety state - Plan: escitalopram (LEXAPRO) 20 MG tablet  - stable, continue lexapro.    Meds ordered this encounter  Medications  . doxazosin (CARDURA) 4 MG tablet    Sig: Take 1 tablet (4 mg total) by mouth daily.    Dispense:  90 tablet    Refill:  1  . levothyroxine (SYNTHROID) 50 MCG tablet    Sig: Take 1 tablet (50 mcg total) by mouth daily.    Dispense:  90 tablet    Refill:  1  . losartan (COZAAR) 100 MG tablet    Sig: Take 1 tablet (100 mg total) by mouth daily.    Dispense:  90 tablet    Refill:  1  .  simvastatin (ZOCOR) 20 MG tablet    Sig: Take 1 tablet (20 mg total) by mouth every evening.    Dispense:  90 tablet    Refill:  3  . escitalopram (LEXAPRO) 20 MG tablet    Sig: Take 1 tablet (20 mg total) by mouth daily.    Dispense:  180 tablet    Refill:  1   Patient Instructions   No medication changes at this time.  I will check some blood work. Okay to continue ipratropium for nasal congestion as needed.  I will check COVID-19 test and other virus testing today.  I would recommend isolating yourself as much as possible until those results are known, and wear a mask around others if you are unable to isolate.  If any shortness of breath, or worsening of symptoms be seen through urgent care or ER.  Let me know if there are questions and take care.  If you have lab work done today you will be contacted with your lab results within the next 2 weeks.  If you have not heard from [place] then please contact us. The fastest way to get your results is to register for My Chart.   IF you received an x-ray today, you will receive an invoice from [facility]. Please contact [facility] at [phone] with questions or concerns regarding your invoice.  IF you received labwork today, you will receive an invoice from [facility]. Please contact [facility] at [phone] with questions or concerns regarding your invoice.   Our billing staff will not be able to assist you with questions regarding bills from these companies.  You will be contacted with the lab results as soon as they are available. The fastest way to get your results is to activate your My Chart account. Instructions are located on the last page of this paperwork. If you have not heard from [place] regarding the results in 2 weeks, please contact this office.         Signed, Merri Ray, MD [facility]

## 2020-05-13 NOTE — Patient Instructions (Addendum)
No medication changes at this time.  I will check some blood work. Okay to continue ipratropium for nasal congestion as needed.  I will check COVID-19 test and other virus testing today.  I would recommend isolating yourself as much as possible until those results are known, and wear a mask around others if you are unable to isolate.  If any shortness of breath, or worsening of symptoms be seen through urgent care or ER.  Let me know if there are questions and take care.  If you have lab work done today you will be contacted with your lab results within the next 2 weeks.  If you have not heard from [place] then please contact us. The fastest way to get your results is to register for My Chart.   IF you received an x-ray today, you will receive an invoice from [facility]. Please contact [facility] at [phone] with questions or concerns regarding your invoice.   IF you received labwork today, you will receive an invoice from [facility]. Please contact [facility] at [phone] with questions or concerns regarding your invoice.   Our billing staff will not be able to assist you with questions regarding bills from these companies.  You will be contacted with the lab results as soon as they are available. The fastest way to get your results is to activate your My Chart account. Instructions are located on the last page of this paperwork. If you have not heard from [place] regarding the results in 2 weeks, please contact this office.

## 2020-05-14 LAB — COMPREHENSIVE METABOLIC PANEL
ALT: 9 IU/L (ref 0–44)
AST: 13 IU/L (ref 0–40)
Albumin/Globulin Ratio: 2.4 — ABNORMAL HIGH (ref 1.2–2.2)
Albumin: 4.5 g/dL (ref 3.7–4.7)
Alkaline Phosphatase: 86 IU/L (ref 44–121)
BUN/Creatinine Ratio: 12 (ref 10–24)
BUN: 13 mg/dL (ref 8–27)
Bilirubin Total: 0.8 mg/dL (ref 0.0–1.2)
CO2: 28 mmol/L (ref 20–29)
Calcium: 9.5 mg/dL (ref 8.6–10.2)
Chloride: 102 mmol/L (ref 96–106)
Creatinine, Ser: 1.08 mg/dL (ref 0.76–1.27)
GFR calc Af Amer: 75 mL/min/{1.73_m2} (ref >59)
GFR calc non Af Amer: 65 mL/min/{1.73_m2} (ref >59)
Globulin, Total: 1.9 g/dL (ref 1.5–4.5)
Glucose: 69 mg/dL (ref 65–99)
Potassium: 3.6 mmol/L (ref 3.5–5.2)
Sodium: 143 mmol/L (ref 134–144)
Total Protein: 6.4 g/dL (ref 6.0–8.5)

## 2020-05-14 LAB — LIPID PANEL
Chol/HDL Ratio: 2 ratio (ref 0.0–5.0)
Cholesterol, Total: 137 mg/dL (ref 100–199)
HDL: 67 mg/dL (ref >39)
LDL Chol Calc (NIH): 54 mg/dL (ref 0–99)
Triglycerides: 83 mg/dL (ref 0–149)
VLDL Cholesterol Cal: 16 mg/dL (ref 5–40)

## 2020-05-14 LAB — TSH: TSH: 8.13 u[IU]/mL — ABNORMAL HIGH (ref 0.450–4.500)

## 2020-05-15 LAB — COVID-19, FLU A+B AND RSV
Influenza A, NAA: NOT DETECTED
Influenza B, NAA: NOT DETECTED
RSV, NAA: NOT DETECTED
SARS-CoV-2, NAA: DETECTED — AB

## 2020-05-21 ENCOUNTER — Other Ambulatory Visit: Payer: Self-pay | Admitting: Family Medicine

## 2020-05-21 DIAGNOSIS — I10 Essential (primary) hypertension: Secondary | ICD-10-CM

## 2020-05-29 DIAGNOSIS — H5213 Myopia, bilateral: Secondary | ICD-10-CM | POA: Diagnosis not present

## 2020-05-29 DIAGNOSIS — H52223 Regular astigmatism, bilateral: Secondary | ICD-10-CM | POA: Diagnosis not present

## 2020-05-29 DIAGNOSIS — Z0100 Encounter for examination of eyes and vision without abnormal findings: Secondary | ICD-10-CM | POA: Diagnosis not present

## 2020-05-29 DIAGNOSIS — H5212 Myopia, left eye: Secondary | ICD-10-CM | POA: Diagnosis not present

## 2020-07-30 DIAGNOSIS — E785 Hyperlipidemia, unspecified: Secondary | ICD-10-CM | POA: Diagnosis not present

## 2020-07-30 DIAGNOSIS — E039 Hypothyroidism, unspecified: Secondary | ICD-10-CM | POA: Diagnosis not present

## 2020-07-30 DIAGNOSIS — Z008 Encounter for other general examination: Secondary | ICD-10-CM | POA: Diagnosis not present

## 2020-07-30 DIAGNOSIS — I10 Essential (primary) hypertension: Secondary | ICD-10-CM | POA: Diagnosis not present

## 2020-07-30 DIAGNOSIS — M1990 Unspecified osteoarthritis, unspecified site: Secondary | ICD-10-CM | POA: Diagnosis not present

## 2020-07-30 DIAGNOSIS — J302 Other seasonal allergic rhinitis: Secondary | ICD-10-CM | POA: Diagnosis not present

## 2020-07-30 DIAGNOSIS — N529 Male erectile dysfunction, unspecified: Secondary | ICD-10-CM | POA: Diagnosis not present

## 2020-07-30 DIAGNOSIS — G8929 Other chronic pain: Secondary | ICD-10-CM | POA: Diagnosis not present

## 2020-07-30 DIAGNOSIS — Z791 Long term (current) use of non-steroidal anti-inflammatories (NSAID): Secondary | ICD-10-CM | POA: Diagnosis not present

## 2020-07-30 DIAGNOSIS — R69 Illness, unspecified: Secondary | ICD-10-CM | POA: Diagnosis not present

## 2020-07-30 DIAGNOSIS — H409 Unspecified glaucoma: Secondary | ICD-10-CM | POA: Diagnosis not present

## 2020-08-03 ENCOUNTER — Inpatient Hospital Stay (HOSPITAL_COMMUNITY)
Admission: RE | Admit: 2020-08-03 | Discharge: 2020-08-03 | Disposition: A | Discharge status: Home / Self Care | Payer: Medicare HMO | Source: Ambulatory Visit

## 2020-08-03 NOTE — Progress Notes (Signed)
Covid positive on 05/13/20. No need to retest at this time per anesthesia protocol.

## 2020-08-04 ENCOUNTER — Encounter (HOSPITAL_COMMUNITY): Payer: Self-pay | Admitting: General Surgery

## 2020-08-04 NOTE — Progress Notes (Signed)
Pre-op instructions given to pt. No solid food after midnight, clear liquids until 0630. Denies chest pain/SOB.

## 2020-08-06 ENCOUNTER — Ambulatory Visit (HOSPITAL_COMMUNITY): Payer: Medicare HMO

## 2020-08-06 ENCOUNTER — Encounter (HOSPITAL_COMMUNITY)
Admission: RE | Disposition: A | Discharge status: Home / Self Care | Payer: Self-pay | Source: Home / Self Care | Attending: General Surgery

## 2020-08-06 ENCOUNTER — Other Ambulatory Visit: Payer: Self-pay

## 2020-08-06 ENCOUNTER — Ambulatory Visit (HOSPITAL_COMMUNITY)
Admission: RE | Admit: 2020-08-06 | Discharge: 2020-08-06 | Disposition: A | Discharge status: Home / Self Care | Payer: Medicare HMO | Attending: General Surgery | Admitting: General Surgery

## 2020-08-06 ENCOUNTER — Encounter (HOSPITAL_COMMUNITY): Payer: Self-pay | Admitting: General Surgery

## 2020-08-06 DIAGNOSIS — I10 Essential (primary) hypertension: Secondary | ICD-10-CM | POA: Diagnosis not present

## 2020-08-06 DIAGNOSIS — K432 Incisional hernia without obstruction or gangrene: Secondary | ICD-10-CM | POA: Insufficient documentation

## 2020-08-06 DIAGNOSIS — E785 Hyperlipidemia, unspecified: Secondary | ICD-10-CM | POA: Insufficient documentation

## 2020-08-06 DIAGNOSIS — Z885 Allergy status to narcotic agent status: Secondary | ICD-10-CM | POA: Insufficient documentation

## 2020-08-06 DIAGNOSIS — E7800 Pure hypercholesterolemia, unspecified: Secondary | ICD-10-CM | POA: Diagnosis not present

## 2020-08-06 DIAGNOSIS — Z79899 Other long term (current) drug therapy: Secondary | ICD-10-CM | POA: Diagnosis not present

## 2020-08-06 DIAGNOSIS — Z888 Allergy status to other drugs, medicaments and biological substances status: Secondary | ICD-10-CM | POA: Insufficient documentation

## 2020-08-06 DIAGNOSIS — E039 Hypothyroidism, unspecified: Secondary | ICD-10-CM | POA: Diagnosis not present

## 2020-08-06 HISTORY — PX: XI ROBOTIC ASSISTED VENTRAL HERNIA: SHX6789

## 2020-08-06 HISTORY — PX: INSERTION OF MESH: SHX5868

## 2020-08-06 LAB — BASIC METABOLIC PANEL
Anion gap: 7 (ref 5–15)
BUN: 11 mg/dL (ref 8–23)
CO2: 29 mmol/L (ref 22–32)
Calcium: 8.8 mg/dL — ABNORMAL LOW (ref 8.9–10.3)
Chloride: 104 mmol/L (ref 98–111)
Creatinine, Ser: 1.07 mg/dL (ref 0.61–1.24)
GFR, Estimated: >60 mL/min (ref >60)
Glucose, Bld: 103 mg/dL — ABNORMAL HIGH (ref 70–99)
Potassium: 3.3 mmol/L — ABNORMAL LOW (ref 3.5–5.1)
Sodium: 140 mmol/L (ref 135–145)

## 2020-08-06 LAB — CBC
HCT: 36.7 % — ABNORMAL LOW (ref 39.0–52.0)
Hemoglobin: 12.9 g/dL — ABNORMAL LOW (ref 13.0–17.0)
MCH: 33.9 pg (ref 26.0–34.0)
MCHC: 35.1 g/dL (ref 30.0–36.0)
MCV: 96.3 fL (ref 80.0–100.0)
Platelets: 142 10*3/uL — ABNORMAL LOW (ref 150–400)
RBC: 3.81 MIL/uL — ABNORMAL LOW (ref 4.22–5.81)
RDW: 12.7 % (ref 11.5–15.5)
WBC: 3.2 10*3/uL — ABNORMAL LOW (ref 4.0–10.5)
nRBC: 0 % (ref 0.0–0.2)

## 2020-08-06 LAB — POCT I-STAT, CHEM 8
BUN: 12 mg/dL (ref 8–23)
Calcium, Ion: 1.17 mmol/L (ref 1.15–1.40)
Chloride: 102 mmol/L (ref 98–111)
Creatinine, Ser: 1 mg/dL (ref 0.61–1.24)
Glucose, Bld: 107 mg/dL — ABNORMAL HIGH (ref 70–99)
HCT: 32 % — ABNORMAL LOW (ref 39.0–52.0)
Hemoglobin: 10.9 g/dL — ABNORMAL LOW (ref 13.0–17.0)
Potassium: 3 mmol/L — ABNORMAL LOW (ref 3.5–5.1)
Sodium: 141 mmol/L (ref 135–145)
TCO2: 29 mmol/L (ref 22–32)

## 2020-08-06 SURGERY — REPAIR, HERNIA, VENTRAL, ROBOT-ASSISTED
Anesthesia: General | Site: Flank

## 2020-08-06 MED ORDER — FENTANYL CITRATE (PF) 250 MCG/5ML IJ SOLN
INTRAMUSCULAR | Status: AC
  Filled 2020-08-06: qty 5

## 2020-08-06 MED ORDER — DEXAMETHASONE SODIUM PHOSPHATE 10 MG/ML IJ SOLN
INTRAMUSCULAR | Status: AC
  Filled 2020-08-06: qty 1

## 2020-08-06 MED ORDER — SUCCINYLCHOLINE CHLORIDE 200 MG/10ML IV SOSY
PREFILLED_SYRINGE | INTRAVENOUS | Status: AC
  Filled 2020-08-06: qty 10

## 2020-08-06 MED ORDER — BUPIVACAINE LIPOSOME 1.3 % IJ SUSP
INTRAMUSCULAR | Status: AC
  Filled 2020-08-06: qty 20

## 2020-08-06 MED ORDER — PROPOFOL 10 MG/ML IV BOLUS
INTRAVENOUS | Status: DC | PRN
  Administered 2020-08-06: 120 mg via INTRAVENOUS

## 2020-08-06 MED ORDER — SUGAMMADEX SODIUM 200 MG/2ML IV SOLN
INTRAVENOUS | Status: DC | PRN
  Administered 2020-08-06: 200 mg via INTRAVENOUS

## 2020-08-06 MED ORDER — PROPOFOL 10 MG/ML IV BOLUS
INTRAVENOUS | Status: AC
  Filled 2020-08-06: qty 20

## 2020-08-06 MED ORDER — SUCCINYLCHOLINE CHLORIDE 20 MG/ML IJ SOLN
INTRAMUSCULAR | Status: DC | PRN
  Administered 2020-08-06: 120 mg via INTRAVENOUS

## 2020-08-06 MED ORDER — FENTANYL CITRATE (PF) 100 MCG/2ML IJ SOLN
INTRAMUSCULAR | Status: DC | PRN
  Administered 2020-08-06: 100 ug via INTRAVENOUS

## 2020-08-06 MED ORDER — FENTANYL CITRATE (PF) 100 MCG/2ML IJ SOLN
25.0000 ug | INTRAMUSCULAR | Status: DC | PRN
  Administered 2020-08-06: 25 ug via INTRAVENOUS
  Administered 2020-08-06: 50 ug via INTRAVENOUS

## 2020-08-06 MED ORDER — LACTATED RINGERS IV SOLN: INTRAVENOUS | Status: DC

## 2020-08-06 MED ORDER — LIDOCAINE 2% (20 MG/ML) 5 ML SYRINGE
INTRAMUSCULAR | Status: AC
  Filled 2020-08-06: qty 5

## 2020-08-06 MED ORDER — TRAMADOL HCL 50 MG PO TABS
50.0000 mg | ORAL_TABLET | Freq: Once | ORAL | Status: AC
Start: 2020-08-06 — End: 2020-08-06
  Administered 2020-08-06: 50 mg via ORAL

## 2020-08-06 MED ORDER — ALBUMIN HUMAN 5 % IV SOLN: INTRAVENOUS | Status: DC | PRN

## 2020-08-06 MED ORDER — ROCURONIUM BROMIDE 10 MG/ML (PF) SYRINGE
PREFILLED_SYRINGE | INTRAVENOUS | Status: AC
  Filled 2020-08-06: qty 10

## 2020-08-06 MED ORDER — SODIUM CHLORIDE 0.9 % IV SOLN
INTRAVENOUS | Status: DC | PRN
  Administered 2020-08-06: 40 mL

## 2020-08-06 MED ORDER — CHLORHEXIDINE GLUCONATE CLOTH 2 % EX PADS: 6.0000 | MEDICATED_PAD | Freq: Once | CUTANEOUS | Status: DC

## 2020-08-06 MED ORDER — PHENYLEPHRINE 40 MCG/ML (10ML) SYRINGE FOR IV PUSH (FOR BLOOD PRESSURE SUPPORT)
PREFILLED_SYRINGE | INTRAVENOUS | Status: AC
  Filled 2020-08-06: qty 10

## 2020-08-06 MED ORDER — SODIUM CHLORIDE 0.9 % IV SOLN
INTRAVENOUS | Status: DC | PRN
  Administered 2020-08-06: 30 ug/min via INTRAVENOUS

## 2020-08-06 MED ORDER — 0.9 % SODIUM CHLORIDE (POUR BTL) OPTIME
TOPICAL | Status: DC | PRN
  Administered 2020-08-06: 1000 mL

## 2020-08-06 MED ORDER — CEFAZOLIN SODIUM-DEXTROSE 2-4 GM/100ML-% IV SOLN
2.0000 g | INTRAVENOUS | Status: AC
  Administered 2020-08-06: 2 g via INTRAVENOUS
  Filled 2020-08-06: qty 100

## 2020-08-06 MED ORDER — FENTANYL CITRATE (PF) 100 MCG/2ML IJ SOLN
INTRAMUSCULAR | Status: AC
  Administered 2020-08-06: 25 ug via INTRAVENOUS
  Filled 2020-08-06: qty 2

## 2020-08-06 MED ORDER — DEXAMETHASONE SODIUM PHOSPHATE 10 MG/ML IJ SOLN
INTRAMUSCULAR | Status: DC | PRN
  Administered 2020-08-06: 10 mg via INTRAVENOUS

## 2020-08-06 MED ORDER — BUPIVACAINE HCL (PF) 0.25 % IJ SOLN
INTRAMUSCULAR | Status: AC
  Filled 2020-08-06: qty 30

## 2020-08-06 MED ORDER — EPHEDRINE 5 MG/ML INJ
INTRAVENOUS | Status: AC
  Filled 2020-08-06: qty 10

## 2020-08-06 MED ORDER — BUPIVACAINE HCL 0.25 % IJ SOLN
INTRAMUSCULAR | Status: DC | PRN
  Administered 2020-08-06: 9 mL

## 2020-08-06 MED ORDER — TRAMADOL HCL 50 MG PO TABS
ORAL_TABLET | ORAL | Status: AC
  Filled 2020-08-06: qty 1

## 2020-08-06 MED ORDER — ONDANSETRON HCL 4 MG/2ML IJ SOLN
INTRAMUSCULAR | Status: AC
  Filled 2020-08-06: qty 2

## 2020-08-06 MED ORDER — MIDAZOLAM HCL 2 MG/2ML IJ SOLN
INTRAMUSCULAR | Status: AC
  Filled 2020-08-06: qty 2

## 2020-08-06 MED ORDER — EPHEDRINE SULFATE 50 MG/ML IJ SOLN
INTRAMUSCULAR | Status: DC | PRN
  Administered 2020-08-06 (×2): 10 mg via INTRAVENOUS
  Administered 2020-08-06 (×2): 5 mg via INTRAVENOUS

## 2020-08-06 MED ORDER — TRAMADOL HCL 50 MG PO TABS: 50.0000 mg | ORAL_TABLET | Freq: Four times a day (QID) | ORAL | 0 refills | Status: DC | PRN

## 2020-08-06 MED ORDER — ROCURONIUM BROMIDE 100 MG/10ML IV SOLN
INTRAVENOUS | Status: DC | PRN
  Administered 2020-08-06: 40 mg via INTRAVENOUS

## 2020-08-06 MED ORDER — CHLORHEXIDINE GLUCONATE 0.12 % MT SOLN
15.0000 mL | Freq: Once | OROMUCOSAL | Status: AC
  Administered 2020-08-06: 15 mL via OROMUCOSAL
  Filled 2020-08-06: qty 15

## 2020-08-06 MED ORDER — ACETAMINOPHEN 500 MG PO TABS
1000.0000 mg | ORAL_TABLET | ORAL | Status: AC
  Administered 2020-08-06: 1000 mg via ORAL
  Filled 2020-08-06: qty 2

## 2020-08-06 MED ORDER — ONDANSETRON HCL 4 MG/2ML IJ SOLN
INTRAMUSCULAR | Status: DC | PRN
  Administered 2020-08-06: 4 mg via INTRAVENOUS

## 2020-08-06 MED ORDER — ORAL CARE MOUTH RINSE: 15.0000 mL | Freq: Once | OROMUCOSAL | Status: AC

## 2020-08-06 MED ORDER — LIDOCAINE 2% (20 MG/ML) 5 ML SYRINGE
INTRAMUSCULAR | Status: DC | PRN
  Administered 2020-08-06: 80 mg via INTRAVENOUS

## 2020-08-06 SURGICAL SUPPLY — 59 items
ADH SKN CLS APL DERMABOND .7 (GAUZE/BANDAGES/DRESSINGS) ×2
APL LAPSCP 35 DL APL RGD (MISCELLANEOUS)
APL PRP STRL LF DISP 70% ISPRP (MISCELLANEOUS) ×2
APPLICATOR VISTASEAL 35 (MISCELLANEOUS) IMPLANT
CANNULA REDUC XI 12-8 STAPL (CANNULA) ×3
CANNULA REDUCER 12-8 DVNC XI (CANNULA) ×2 IMPLANT
CHLORAPREP W/TINT 26 (MISCELLANEOUS) ×3 IMPLANT
COVER MAYO STAND STRL (DRAPES) ×3 IMPLANT
COVER SURGICAL LIGHT HANDLE (MISCELLANEOUS) ×3 IMPLANT
COVER TIP SHEARS 8 DVNC (MISCELLANEOUS) ×2 IMPLANT
COVER TIP SHEARS 8MM DA VINCI (MISCELLANEOUS) ×3
COVER WAND RF STERILE (DRAPES) IMPLANT
DECANTER SPIKE VIAL GLASS SM (MISCELLANEOUS) ×3 IMPLANT
DEFOGGER SCOPE WARMER CLEARIFY (MISCELLANEOUS) ×2 IMPLANT
DERMABOND ADVANCED (GAUZE/BANDAGES/DRESSINGS) ×1
DERMABOND ADVANCED .7 DNX12 (GAUZE/BANDAGES/DRESSINGS) ×2 IMPLANT
DEVICE SECURE STRAP 25 ABSORB (INSTRUMENTS) IMPLANT
DEVICE TROCAR PUNCTURE CLOSURE (ENDOMECHANICALS) ×3 IMPLANT
DRAPE ARM DVNC X/XI (DISPOSABLE) ×8 IMPLANT
DRAPE CARDIOVASCULAR INCISE (DRAPES)
DRAPE COLUMN DVNC XI (DISPOSABLE) ×2 IMPLANT
DRAPE CV SPLIT W-CLR ANES SCRN (DRAPES) ×3 IMPLANT
DRAPE DA VINCI XI ARM (DISPOSABLE) ×12
DRAPE DA VINCI XI COLUMN (DISPOSABLE) ×3
DRAPE ORTHO SPLIT 77X108 STRL (DRAPES) ×3
DRAPE SRG 135X102X78XABS (DRAPES) ×2 IMPLANT
DRAPE SURG ORHT 6 SPLT 77X108 (DRAPES) IMPLANT
GLOVE BIO SURGEON STRL SZ7.5 (GLOVE) ×6 IMPLANT
GOWN STRL REUS W/ TWL LRG LVL3 (GOWN DISPOSABLE) ×4 IMPLANT
GOWN STRL REUS W/ TWL XL LVL3 (GOWN DISPOSABLE) ×4 IMPLANT
GOWN STRL REUS W/TWL 2XL LVL3 (GOWN DISPOSABLE) ×3 IMPLANT
GOWN STRL REUS W/TWL LRG LVL3 (GOWN DISPOSABLE) ×6
GOWN STRL REUS W/TWL XL LVL3 (GOWN DISPOSABLE) ×6
KIT BASIN OR (CUSTOM PROCEDURE TRAY) ×3 IMPLANT
KIT TURNOVER KIT B (KITS) ×3 IMPLANT
MARKER SKIN DUAL TIP RULER LAB (MISCELLANEOUS) ×3 IMPLANT
MESH PROGRIP LAP SELF FIXATING (Mesh General) ×3 IMPLANT
MESH PROGRIP LAP SLF FIX 16X12 (Mesh General) IMPLANT
NEEDLE HYPO 22GX1.5 SAFETY (NEEDLE) ×3 IMPLANT
OBTURATOR OPTICAL STANDARD 8MM (TROCAR)
OBTURATOR OPTICAL STND 8 DVNC (TROCAR)
OBTURATOR OPTICALSTD 8 DVNC (TROCAR) IMPLANT
PAD ARMBOARD 7.5X6 YLW CONV (MISCELLANEOUS) ×6 IMPLANT
PENCIL SMOKE EVACUATOR (MISCELLANEOUS) IMPLANT
SCISSORS LAP 5X35 DISP (ENDOMECHANICALS) ×2 IMPLANT
SEAL CANN UNIV 5-8 DVNC XI (MISCELLANEOUS) ×6 IMPLANT
SEAL XI 5MM-8MM UNIVERSAL (MISCELLANEOUS) ×9
SET IRRIG TUBING LAPAROSCOPIC (IRRIGATION / IRRIGATOR) IMPLANT
SET TUBE SMOKE EVAC HIGH FLOW (TUBING) ×3 IMPLANT
STAPLER CANNULA SEAL DVNC XI (STAPLE) ×2 IMPLANT
STAPLER CANNULA SEAL XI (STAPLE) ×3
STOPCOCK 4 WAY LG BORE MALE ST (IV SETS) ×3 IMPLANT
SUT MNCRL AB 4-0 PS2 18 (SUTURE) ×3 IMPLANT
SUT VLOC 180 0 9IN  GS21 (SUTURE) ×3
SUT VLOC 180 0 9IN GS21 (SUTURE) IMPLANT
SUT VLOC 180 2-0 9IN GS21 (SUTURE) IMPLANT
TOWEL GREEN STERILE FF (TOWEL DISPOSABLE) ×3 IMPLANT
TRAY LAPAROSCOPIC MC (CUSTOM PROCEDURE TRAY) ×3 IMPLANT
TROCAR XCEL NON-BLD 5MMX100MML (ENDOMECHANICALS) IMPLANT

## 2020-08-06 NOTE — Anesthesia Procedure Notes (Signed)
Procedure Name: Intubation Date/Time: 08/06/2020 9:24 AM Performed by: Glynda Jaeger, CRNA Pre-anesthesia Checklist: Patient identified, Emergency Drugs available, Suction available and Patient being monitored Patient Re-evaluated:Patient Re-evaluated prior to induction Oxygen Delivery Method: Circle System Utilized Preoxygenation: Pre-oxygenation with 100% oxygen Induction Type: IV induction Ventilation: Mask ventilation without difficulty Laryngoscope Size: Mac and 4 Grade View: Grade II Tube type: Oral Tube size: 7.5 mm Number of attempts: 1 Airway Equipment and Method: Stylet and Oral airway Placement Confirmation: ETT inserted through vocal cords under direct vision,  positive ETCO2 and breath sounds checked- equal and bilateral Secured at: 23 cm Tube secured with: Tape Dental Injury: Teeth and Oropharynx as per pre-operative assessment

## 2020-08-06 NOTE — Discharge Instructions (Signed)
[facility] _______[facility] ° °HERNIA REPAIR: POST OP INSTRUCTIONS ° °Always review your discharge instruction sheet given to you by the facility where your surgery was performed. °IF YOU HAVE DISABILITY OR FAMILY LEAVE FORMS, YOU MUST BRING THEM TO THE OFFICE FOR PROCESSING.   °DO NOT GIVE THEM TO YOUR DOCTOR. ° °1. A  prescription for pain medication may be given to you upon discharge.  Take your pain medication as prescribed, if needed.  If narcotic pain medicine is not needed, then you may take acetaminophen (Tylenol) or ibuprofen (Advil) as needed. °2. Take your usually prescribed medications unless otherwise directed. °If you need a refill on your pain medication, please contact your pharmacy.  They will contact our office to request authorization. Prescriptions will not be filled after 5 pm or on week-ends. °3. You should follow a light diet the first 24 hours after arrival home, such as soup and crackers, etc.  Be sure to include lots of fluids daily.  Resume your normal diet the day after surgery. °4.Most patients will experience some swelling and bruising around the umbilicus or in the groin and scrotum.  Ice packs and reclining will help.  Swelling and bruising can take several days to resolve.  °6. It is common to experience some constipation if taking pain medication after surgery.  Increasing fluid intake and taking a stool softener (such as Colace) will usually help or prevent this problem from occurring.  A mild laxative (Milk of Magnesia or Miralax) should be taken according to package directions if there are no bowel movements after 48 hours. °7. Unless discharge instructions indicate otherwise, you may remove your bandages 24-48 hours after surgery, and you may shower at that time.  You may have steri-strips (small skin tapes) in place directly over the incision.  These strips should be left on the skin for 7-10 days.  If your surgeon used skin glue on the incision, you may shower in  24 hours.  The glue will flake off over the next 2-3 weeks.  Any sutures or staples will be removed at the office during your follow-up visit. °8. ACTIVITIES:  You may resume regular (light) daily activities beginning the next day--such as daily self-care, walking, climbing stairs--gradually increasing activities as tolerated.  You may have sexual intercourse when it is comfortable.  Refrain from any heavy lifting or straining until approved by your doctor. ° °a.You may drive when you are no longer taking prescription pain medication, you can comfortably wear a seatbelt, and you can safely maneuver your car and apply brakes. °b.RETURN TO WORK:   °_____________________________________________ ° °9.You should see your doctor in the office for a follow-up appointment approximately 2-3 weeks after your surgery.  Make sure that you call for this appointment within a day or two after you arrive home to insure a convenient appointment time. °10.OTHER INSTRUCTIONS: _________________________ °   _____________________________________ ° °WHEN TO CALL YOUR DOCTOR: °1. Fever over 101.0 °2. Inability to urinate °3. Nausea and/or vomiting °4. Extreme swelling or bruising °5. Continued bleeding from incision. °6. Increased pain, redness, or drainage from the incision ° °The clinic staff is available to answer your questions during regular business hours.  Please don’t hesitate to call and ask to speak to one of the nurses for clinical concerns.  If you have a medical emergency, go to the nearest emergency room or call 911.  A surgeon from [facility] is always on call at the hospital ° ° °[address], [address], [place], [place]  [address] ? ° [address] [address], [place], [place]   [address] °[phone] ? [phone] ? [phone] [phone] °Web site: [email or web address] ° °

## 2020-08-06 NOTE — Anesthesia Postprocedure Evaluation (Signed)
Anesthesia Post Note  Patient: [name]  Procedure(s) Performed: XI ROBOTIC ASSISTED INCISIONAL FLANK  HERNIA REPAIR WITH MESH (N/A Flank) INSERTION OF MESH (Left Flank)     Patient location during evaluation: PACU Anesthesia Type: General Level of consciousness: awake Pain management: pain level controlled Vital Signs Assessment: post-procedure vital signs reviewed and stable Respiratory status: spontaneous breathing Cardiovascular status: stable Postop Assessment: no apparent nausea or vomiting Anesthetic complications: no   No complications documented.  Last Vitals:  Vitals:   08/06/20 1130 08/06/20 1145  BP: (!) 147/86 (!) 148/86  Pulse: 71 70  Resp: 15 16  Temp:  36.7 C  SpO2: 99% 97%    Last Pain:  Vitals:   08/06/20 1230  TempSrc:   PainSc: 7                  Heydi Swango

## 2020-08-06 NOTE — Progress Notes (Signed)
Pt able to void at this time. Pt stable and ready for discharge

## 2020-08-06 NOTE — Progress Notes (Signed)
Labs sent at [facility].  Called main lab at [phone] because labs were not in process.  Kim in main lab was able to locate labs and stated she would get them started.

## 2020-08-06 NOTE — H&P (Signed)
  History of Present Illness  The patient is a 80 year old male who presents with an incisional hernia. Patient is a 80 year old male, who comes in secondary to a left flank incisional hernia. Patient has a history of hypertension, hyperlipidemia. Patient states he had a left lumbar surgery by Dr. Trenton Gammon. He states that approximately 6-8 months after surgery noticeable also left flank area. Patient underwent CT scan which did reveal a left flank hernia. I did review the CT scan personally. This appears to have retro-peritoneal fat herniating through. Patient does state he has some discomfort and pain to the left side when the laser moved to the left side. Patient otherwise transferrin active.  Patient had previous open umbilical and bilateral inguinal hernia repairs by Dr. Hulen Skains.     Allergies  Codeine/Codeine Derivatives  AmLODIPine & Diet Manage Prod *CALCIUM CHANNEL BLOCKERS*  Bimatoprost *CHEMICALS*  Latanoprost *CHEMICALS*  Brimonidine Tartrate *CHEMICALS*  Allergies Reconciled   Medication History Lexapro (20MG  Tablet, Oral) Active. Furosemide (20MG  Tablet, Oral) Active. Ipratropium Bromide (0.06% Solution, Nasal) Active. Losartan Potassium (100MG  Tablet, Oral) Active. Amoxicillin (500MG  Capsule, Oral) Active. Fluticasone Propionate (50MCG/ACT Suspension, Nasal) Active. Levothyroxine Sodium (50MCG Tablet, Oral) Active. Omeprazole (20MG  Capsule DR, Oral) Active. Simvastatin (20MG  Tablet, Oral) Active. CloNIDine HCl (0.2MG /24HR Patch Weekly, Transdermal) Active. Dorzolamide HCl-Timolol Mal (22.3-6.8MG /ML Solution, Ophthalmic) Active. Ipratropium Bromide (0.03% Solution, Nasal) Active. Zioptan (0.0015% Solution, Ophthalmic) Active. Medications Reconciled    Review of Systems All other systems negative  BP (!) 150/89   Pulse 66   Temp 97.7 F (36.5 C) (Oral)   Resp 18   Ht 5\' 9"  (1.753 m)   Wt 77.1 kg   SpO2 98%   BMI 25.10 kg/m      Physical Exam The physical exam findings are as follows: Note: Constitutional: No acute distress, conversant, appears stated age  Eyes: Anicteric sclerae, moist conjunctiva, no lid lag  Neck: No thyromegaly, trachea midline, no cervical lymphadenopathy  Lungs: Clear to auscultation biilaterally, normal respiratory effot  Cardiovascular: regular rate & rhythm, no murmurs, no peripheal edema, pedal pulses 2+  GI: Soft, no masses or hepatosplenomegaly, non-tender to palpation  MSK: Normal gait, no clubbing cyanosis, edema  Skin: No rashes, palpation reveals normal skin turgor  Psychiatric: Appropriate judgment and insight, oriented to person, place, and time  Abdomen Inspection Hernias - Incisional - Reducible (Left flank incision with hernia.) .    Assessment & Plan INCISIONAL HERNIA, WITHOUT OBSTRUCTION OR GANGRENE (K43.2) Impression: Patient is a 80 year old male, with history of hypertension, with left incisional flank hernia.  1. The patient will like to proceed to the operating room for robotic left flank hernia repair with mesh.  2. I discussed with the patient the signs and symptoms of incarceration and strangulation and the need to proceed to the ER should they occur.  3. I discussed with the patient the risks and benefits of the procedure to include but not limited to: Infection, bleeding, damage to surrounding structures, possible need for further surgery, possible nerve pain, and possible recurrence. The patient was understanding and wishes to proceed.

## 2020-08-06 NOTE — Anesthesia Preprocedure Evaluation (Signed)
Anesthesia Evaluation  Patient identified by MRN, date of birth, ID band Patient awake    Reviewed: Allergy & Precautions, NPO status , Patient's Chart, lab work & pertinent test results  Airway Mallampati: II  TM Distance: >3 FB     Dental   Pulmonary former smoker,    breath sounds clear to auscultation       Cardiovascular hypertension, + Valvular Problems/Murmurs  Rhythm:Regular Rate:Normal     Neuro/Psych  Neuromuscular disease    GI/Hepatic Neg liver ROS, hiatal hernia, GERD  ,  Endo/Other  Hypothyroidism   Renal/GU negative Renal ROS     Musculoskeletal  (+) Arthritis ,   Abdominal   Peds  Hematology   Anesthesia Other Findings   Reproductive/Obstetrics                             Anesthesia Physical Anesthesia Plan  ASA: III  Anesthesia Plan: General   Post-op Pain Management:    Induction: Intravenous  PONV Risk Score and Plan: 3 and Ondansetron, Dexamethasone and Midazolam  Airway Management Planned: Oral ETT  Additional Equipment:   Intra-op Plan:   Post-operative Plan: Possible Post-op intubation/ventilation  Informed Consent: I have reviewed the patients History and Physical, chart, labs and discussed the procedure including the risks, benefits and alternatives for the proposed anesthesia with the patient or authorized representative who has indicated his/her understanding and acceptance.     Dental advisory given  Plan Discussed with: CRNA and Anesthesiologist  Anesthesia Plan Comments:         Anesthesia Quick Evaluation

## 2020-08-06 NOTE — Op Note (Signed)
08/06/2020  10:28 AM  PATIENT:  [name]  80 y.o. male  PRE-OPERATIVE DIAGNOSIS:  INCISIONAL FLANK HERNIA  POST-OPERATIVE DIAGNOSIS:  INCISIONAL FLANK HERNIA  PROCEDURE:  Procedure(s): XI ROBOTIC ASSISTED INCISIONAL FLANK  HERNIA REPAIR WITH MESH (N/A)  SURGEON:  Surgeon(s) and Role:    * [name], MD - Primary   ASSISTANTS: Pryor Curia, RNFA   ANESTHESIA:   local and general  EBL:  minimal   BLOOD ADMINISTERED:none  DRAINS: none   LOCAL MEDICATIONS USED:  BUPIVICAINE  and OTHER exparel  SPECIMEN:  No Specimen  DISPOSITION OF SPECIMEN:  N/A  COUNTS:  YES  TOURNIQUET:  * No tourniquets in log *  DICTATION: .Dragon Dictation Details of procedure: After the patient was consented she was taken back to the OR and placed in the supine position with bilateral SCDs in place.  Patient was then placed under general trach anesthesia.  Patient was then placed in the left lateral position.  Patient was secured with a beanbag.  Patient was prepped draped in sterile fashion.  Timeout was called all facts verified.  Veress needle technique was used to place the abdomen to 15 mmHg in the left subcostal margin.  Subsequent an 46mm trocar and camera placed intra-abdominal.   The robotic camera was then placed into the trocar.There was no injury change intra-abdominal organs.   Two 8 mm trocar was then placed in the left midclavicular line approximately 10 mm apart.  This was done under direct visualization.  At this time the robot patient cart was brought to the patient's side and the robot was docked to the trochars.  At this time the preperitoneal flap was then created using scissors.  Preperitoneal fat was then taken down from the lateral abdominal wall both bluntly and with cautery to maintain hemostasis.  The hernia was easily visualized with incarcerated preperitoneal fat. The hernia was seen and dissected free of muscles iteself.   The hernia defect was  approximately 4 x 4 cm in size. An area surrounding the hernia was dissected free of surrounding adipose tissue.  At this time the fascia was reapproximated using a 0 V-Loc running suture.  The area of the mesh was and measured this measured approximately 12 x 12 cm.  At this time Progrip 16x12cm mesh was cut to shape and to the size and placed in the preperitoneal space that was created.  Displayed an overlap the mesh both posterior anteriorly, inferiorly and superiorly.  Care was taken to avoid the iliohypogastric and ilioinguinal nerves.  The mesh lay flat against the abdominal wall.  At this time the peritoneum was then reapproximated using a 2-0V lock running stitch in a standard fashion.  At this time Exparel and quarter percent Marcaine with epi were then mixed and used to help block the area over the mesh repair.  At this time insufflation was evacuated.  The robot was undocked.  Trochars were removed.  Trocar incision sites were then reapproximated using 4-0 Monocryl in subcuticular fashion.   PLAN OF CARE: Discharge to home after PACU  PATIENT DISPOSITION:  PACU - hemodynamically stable.   Delay start of Pharmacological VTE agent (>24hrs) due to surgical blood loss or risk of bleeding: not applicable

## 2020-08-06 NOTE — Progress Notes (Signed)
Patient in phase II waiting to urinate before discharge. Vital signs stable. No further monitoring required.

## 2020-08-06 NOTE — Transfer of Care (Signed)
Immediate Anesthesia Transfer of Care Note  Patient: [name]  Procedure(s) Performed: XI ROBOTIC ASSISTED INCISIONAL FLANK  HERNIA REPAIR WITH MESH (N/A Flank) INSERTION OF MESH (Left Flank)  Patient Location: PACU  Anesthesia Type:General  Level of Consciousness: awake and patient cooperative  Airway & Oxygen Therapy: Patient Spontanous Breathing and Patient connected to face mask oxygen  Post-op Assessment: Report given to RN, Post -op Vital signs reviewed and stable and Patient moving all extremities X 4  Post vital signs: Reviewed and stable  Last Vitals:  Vitals Value Taken Time  BP 152/89 08/06/20 1044  Temp    Pulse 73 08/06/20 1046  Resp 14 08/06/20 1046  SpO2 100 % 08/06/20 1046  Vitals shown include unvalidated device data.  Last Pain:  Vitals:   08/06/20 0804  TempSrc:   PainSc: 4       Patients Stated Pain Goal: 3 (14/97/02 6378)  Complications: No complications documented.

## 2020-08-07 ENCOUNTER — Encounter (HOSPITAL_COMMUNITY): Payer: Self-pay | Admitting: General Surgery

## 2020-08-20 ENCOUNTER — Telehealth: Payer: Self-pay

## 2020-08-20 NOTE — Telephone Encounter (Signed)
Patient calling for a refill on Lasix 20mg  new patient upcoming visit 09/05/20. Patients last refill and OV with previous PCP January 2022. Okay to refill or will patient need to call previous PCP?

## 2020-08-20 NOTE — Telephone Encounter (Signed)
Caller was a pt at [facility]. He has a NP appt with Dr Ethelene Hal on 09/05/20 He is out of furosemide (LASIX) 20 MG tablet  [facility] is [name].  How or can he get a refill?

## 2020-08-21 ENCOUNTER — Encounter: Payer: Self-pay | Admitting: Family Medicine

## 2020-08-21 ENCOUNTER — Other Ambulatory Visit: Payer: Self-pay

## 2020-08-21 ENCOUNTER — Telehealth: Payer: Self-pay | Admitting: Family Medicine

## 2020-08-21 DIAGNOSIS — I10 Essential (primary) hypertension: Secondary | ICD-10-CM

## 2020-08-21 DIAGNOSIS — E876 Hypokalemia: Secondary | ICD-10-CM

## 2020-08-21 MED ORDER — FUROSEMIDE 20 MG PO TABS: 20.0000 mg | ORAL_TABLET | Freq: Every day | ORAL | 0 refills | Status: DC

## 2020-08-21 NOTE — Telephone Encounter (Signed)
Patient aware of message below and states that he will give Dr. Nyoka Cowden a call regarding his labs/potassium.

## 2020-08-21 NOTE — Telephone Encounter (Signed)
I received a call today from [facility] stating i needed to follow-up on potass ium  testing for it was low. I haven't been testested except while having surgery a couple weeks ago. Could you advise .

## 2020-08-21 NOTE — Telephone Encounter (Signed)
..  Medication Refills  Last OV:  Medication:  Furosemide 20 mg  Pharmacy:   [facility]  Let patient know to contact pharmacy at the end of the day to make sure medication is ready.   Please notify patient to allow 48-72 hours to process.  Encourage patient to contact the pharmacy for refills or they can request refills through [place] out below:   Last refill:  QTY:  Refill Date:    Other Comments:  Patient has appointment with new provider in May, 2022   Okay for refill?  Please advise.

## 2020-08-21 NOTE — Telephone Encounter (Signed)
Medication sent to patient's pharmacy.

## 2020-08-23 NOTE — Telephone Encounter (Signed)
Please have him come in for lab visit today if possible.

## 2020-08-26 ENCOUNTER — Other Ambulatory Visit: Payer: Self-pay

## 2020-08-26 ENCOUNTER — Telehealth: Payer: Self-pay | Admitting: *Deleted

## 2020-08-26 DIAGNOSIS — Z8639 Personal history of other endocrine, nutritional and metabolic disease: Secondary | ICD-10-CM

## 2020-08-26 NOTE — Telephone Encounter (Signed)
Patient is trying to get his lab work done and is having problems with it.  Please call patient

## 2020-08-26 NOTE — Telephone Encounter (Signed)
Patient is currently at [facility] is requesting order to be faxed over to [phone].    Attn:  [facility] can be reached at [phone].

## 2020-08-28 DIAGNOSIS — Z961 Presence of intraocular lens: Secondary | ICD-10-CM | POA: Diagnosis not present

## 2020-08-28 DIAGNOSIS — H401132 Primary open-angle glaucoma, bilateral, moderate stage: Secondary | ICD-10-CM | POA: Diagnosis not present

## 2020-08-29 NOTE — Telephone Encounter (Signed)
FYI Called pt to discuss, pt declined labs now, states he will wait until he sees his new PCP next week.

## 2020-09-05 ENCOUNTER — Ambulatory Visit (INDEPENDENT_AMBULATORY_CARE_PROVIDER_SITE_OTHER): Payer: Medicare HMO | Admitting: Family Medicine

## 2020-09-05 ENCOUNTER — Other Ambulatory Visit: Payer: Self-pay

## 2020-09-05 ENCOUNTER — Encounter: Payer: Self-pay | Admitting: Family Medicine

## 2020-09-05 VITALS — BP 146/84 | HR 61 | Temp 97.5°F | Ht 69.0 in | Wt 164.4 lb

## 2020-09-05 DIAGNOSIS — E039 Hypothyroidism, unspecified: Secondary | ICD-10-CM | POA: Diagnosis not present

## 2020-09-05 DIAGNOSIS — D61818 Other pancytopenia: Secondary | ICD-10-CM | POA: Diagnosis not present

## 2020-09-05 DIAGNOSIS — E876 Hypokalemia: Secondary | ICD-10-CM

## 2020-09-05 LAB — CBC
HCT: 34.6 % — ABNORMAL LOW (ref 39.0–52.0)
Hemoglobin: 11.9 g/dL — ABNORMAL LOW (ref 13.0–17.0)
MCHC: 34.6 g/dL (ref 30.0–36.0)
MCV: 97.7 fl (ref 78.0–100.0)
Platelets: 131 10*3/uL — ABNORMAL LOW (ref 150.0–400.0)
RBC: 3.54 Mil/uL — ABNORMAL LOW (ref 4.22–5.81)
RDW: 14.3 % (ref 11.5–15.5)
WBC: 2.7 10*3/uL — ABNORMAL LOW (ref 4.0–10.5)

## 2020-09-05 LAB — BASIC METABOLIC PANEL
BUN: 13 mg/dL (ref 6–23)
CO2: 32 mEq/L (ref 19–32)
Calcium: 9 mg/dL (ref 8.4–10.5)
Chloride: 104 mEq/L (ref 96–112)
Creatinine, Ser: 1.05 mg/dL (ref 0.40–1.50)
GFR: 67.35 mL/min (ref >60.00)
Glucose, Bld: 90 mg/dL (ref 70–99)
Potassium: 3.8 mEq/L (ref 3.5–5.1)
Sodium: 142 mEq/L (ref 135–145)

## 2020-09-05 LAB — TSH: TSH: 4.97 u[IU]/mL — ABNORMAL HIGH (ref 0.35–4.50)

## 2020-09-05 NOTE — Progress Notes (Addendum)
Established Patient Office Visit  Subjective:  Patient ID: [name], male    DOB: 07/03/1940  Age: 80 y.o. MRN: [id number]  CC:  Chief Complaint  Patient presents with  . Establish Care    NP/establish care, no concern.     HPI [name] presents for establishment of care by way of transfer.  He is here for follow-up of hypothyroidism, hypokalemia.  He takes 50 mcg of levothyroxine at night prior to sleep.  He is 2 hours fasting typically when he takes it.  History of hypokalemia.  He does take furosemide.  He is on no replacement potassium supplementation at this time.  History of left-sided radical neck surgery back in 1989.  He has yearly follow-up.  Last saw ENT 6 months ago.  He does have stable scar tissue in the left side of his neck.  Past Medical History:  Diagnosis Date  . Arthritis   . BPH (benign prostatic hyperplasia)   . Bruises easily   . Cancer (HCC)    throat  . Complication of anesthesia    difficult urination after anesthesia  . Dizziness   . GERD (gastroesophageal reflux disease)    denies  . Glaucoma    left worse than right  . H/O hiatal hernia   . Heart murmur    pt. reports  doctors can't hear it  . Hyperlipemia   . Hypertension    takes meds daily  . Hypothyroidism   . Transient alteration of awareness    ~ 12/27/17, evaluated by neurologist Dr. Felecia Shelling 01/06/18  . Urinary retention     Past Surgical History:  Procedure Laterality Date  . ANTERIOR LAT LUMBAR FUSION  03/11/2012   Procedure: ANTERIOR LATERAL LUMBAR FUSION 1 LEVEL;  Surgeon: Charlie Pitter, MD;  Location: [facility];  Service: Neurosurgery;  Laterality: Left;  Left lumbar four-five extreme lumbar interbody fusion with percutaneous pedicle screws   . BACK SURGERY     ruptured disc repair  . COLONOSCOPY W/ POLYPECTOMY    . EYE SURGERY     left cataract  . HERNIA REPAIR    . INGUINAL HERNIA REPAIR Right 05/22/2014   Procedure: REPAIR RECURRENT RIGHT INGUINAL HERNIA;   Surgeon: Doreen Salvage, MD;  Location: [facility];  Service: General;  Laterality: Right;  . INSERTION OF MESH Right 05/22/2014   Procedure: INSERTION OF MESH;  Surgeon: Doreen Salvage, MD;  Location: [facility];  Service: General;  Laterality: Right;  . INSERTION OF MESH Left 08/06/2020   Procedure: INSERTION OF MESH;  Surgeon: [name], MD;  Location: [facility];  Service: General;  Laterality: Left;  . LUMBAR LAMINECTOMY/DECOMPRESSION MICRODISCECTOMY Left 01/11/2018   Procedure: Laminectomy and Foraminotomy - Left - Lumbar Three-Lumbar Four - Lumbar Five-Sacral One;  Surgeon: Earnie Larsson, MD;  Location: [facility] [place];  Service: Neurosurgery;  Laterality: Left;  Laminectomy and Foraminotomy - Left - Lumbar Three-Lumbar Four - Lumbar Five-Sacral One  . LUMBAR PERCUTANEOUS PEDICLE SCREW 1 LEVEL  03/11/2012   Procedure: LUMBAR PERCUTANEOUS PEDICLE SCREW 1 LEVEL;  Surgeon: Charlie Pitter, MD;  Location: [facility];  Service: Neurosurgery;  Laterality: Left;  Left lumbar four-five extreme lumbar interbody fusion with percutaneous pedicle screws   . RADICAL NECK DISSECTION  1989  . ROTATOR CUFF REPAIR     right  . TONSILLECTOMY    . XI ROBOTIC ASSISTED VENTRAL HERNIA N/A 08/06/2020   Procedure: XI ROBOTIC ASSISTED INCISIONAL FLANK  HERNIA REPAIR WITH MESH;  Surgeon: [name], MD;  Location: [facility];  Service: General;  Laterality: N/A;    History reviewed. No pertinent family history.  Social History   Socioeconomic History  . Marital status: Married    Spouse name: Not on file  . Number of children: Not on file  . Years of education: Not on file  . Highest education level: Not on file  Occupational History  . Not on file  Tobacco Use  . Smoking status: Former Research scientist (life sciences)  . Smokeless tobacco: Never Used  Vaping Use  . Vaping Use: Never used  Substance and Sexual Activity  . Alcohol use: Yes    Comment: social 1 or 2 beers a week  . Drug use: No  . Sexual activity: Not Currently  Other Topics Concern  . Not  on file  Social History Narrative  . Not on file   Social Determinants of Health   Financial Resource Strain: Not on file  Food Insecurity: Not on file  Transportation Needs: Not on file  Physical Activity: Not on file  Stress: Not on file  Social Connections: Not on file  Intimate Partner Violence: Not on file    Outpatient Medications Prior to Visit  Medication Sig Dispense Refill  . dorzolamide-timolol (COSOPT) 22.3-6.8 MG/ML ophthalmic solution Place 1 drop into both eyes 2 (two) times daily.    Marland Kitchen doxazosin (CARDURA) 4 MG tablet Take 1 tablet (4 mg total) by mouth daily. 90 tablet 1  . escitalopram (LEXAPRO) 20 MG tablet Take 1 tablet (20 mg total) by mouth daily. 180 tablet 1  . fluticasone (FLONASE) 50 MCG/ACT nasal spray Place 1 spray into both nostrils daily. (Patient taking differently: Place 1 spray into both nostrils daily as needed for allergies.) 16 g 6  . furosemide (LASIX) 20 MG tablet Take 1 tablet (20 mg total) by mouth daily. 30 tablet 0  . ipratropium (ATROVENT) 0.06 % nasal spray Place 2 sprays into both nostrils 3 (three) times daily as needed (allergies).    . latanoprost (XALATAN) 0.005 % ophthalmic solution Place 1 drop into the left eye at bedtime.    Marland Kitchen losartan (COZAAR) 100 MG tablet Take 1 tablet (100 mg total) by mouth daily. 90 tablet 1  . naproxen sodium (ALEVE) 220 MG tablet Take 220 mg by mouth daily as needed (pain).    . simvastatin (ZOCOR) 20 MG tablet Take 1 tablet (20 mg total) by mouth every evening. 90 tablet 3  . levothyroxine (SYNTHROID) 50 MCG tablet Take 1 tablet (50 mcg total) by mouth daily. 90 tablet 1  . loratadine (ALAVERT) 10 MG dissolvable tablet Take 10 mg by mouth daily as needed for allergies. (Patient not taking: Reported on 09/05/2020)    . traMADol (ULTRAM) 50 MG tablet Take 1 tablet (50 mg total) by mouth every 6 (six) hours as needed. (Patient not taking: Reported on 09/05/2020) 20 tablet 0   No facility-administered medications  prior to visit.    Allergies  Allergen Reactions  . Amlodipine Other (See Comments)    Swollen gums    . Bimatoprost Other (See Comments)    Toxicity   . Brimonidine Tartrate-Timolol Other (See Comments)    Toxicity  . Codeine Nausea Only    ROS Review of Systems  Constitutional: Negative.   HENT: Negative.   Eyes: Negative for photophobia and visual disturbance.  Respiratory: Negative.   Cardiovascular: Negative.   Gastrointestinal: Negative.   Endocrine: Negative for cold intolerance and heat intolerance.  Genitourinary: Negative.  Musculoskeletal: Negative for gait problem and joint swelling.  Neurological: Negative for speech difficulty.   Depression screen [facility] 2/9 09/05/2020 09/05/2020 05/13/2020  Decreased Interest 0 0 0  Down, Depressed, Hopeless 0 0 0  PHQ - 2 Score 0 0 0  Altered sleeping 0 - -  Tired, decreased energy 0 - -  Change in appetite 0 - -  Feeling bad or failure about yourself  0 - -  Trouble concentrating 0 - -  Moving slowly or fidgety/restless 0 - -  Suicidal thoughts 0 - -  PHQ-9 Score 0 - -  Difficult doing work/chores Not difficult at all - -      Objective:    Physical Exam Vitals and nursing note reviewed.  Constitutional:      General: He is not in acute distress.    Appearance: Normal appearance. He is normal weight. He is not ill-appearing, toxic-appearing or diaphoretic.  HENT:     Head: Normocephalic and atraumatic.     Right Ear: Tympanic membrane, ear canal and external ear normal.     Left Ear: Tympanic membrane, ear canal and external ear normal.     Mouth/Throat:     Mouth: Mucous membranes are moist.     Pharynx: Oropharynx is clear. No oropharyngeal exudate.  Eyes:     General: No scleral icterus.       Right eye: No discharge.        Left eye: No discharge.     Extraocular Movements: Extraocular movements intact.     Conjunctiva/sclera: Conjunctivae normal.     Pupils: Pupils are equal, round, and reactive to  light.  Neck:   Cardiovascular:     Rate and Rhythm: Normal rate and regular rhythm.  Pulmonary:     Effort: Pulmonary effort is normal.     Breath sounds: Normal breath sounds.  Musculoskeletal:     Cervical back: No rigidity or tenderness.  Neurological:     Mental Status: He is alert and oriented to person, place, and time.  Psychiatric:        Mood and Affect: Mood normal.        Behavior: Behavior normal.     BP (!) 146/84   Pulse 61   Temp (!) 97.5 F (36.4 C) (Temporal)   Ht 5\' 9"  (1.753 m)   Wt 164 lb 6.4 oz (74.6 kg)   SpO2 97%   BMI 24.28 kg/m  Wt Readings from Last 3 Encounters:  09/05/20 164 lb 6.4 oz (74.6 kg)  08/06/20 170 lb (77.1 kg)  05/13/20 171 lb (77.6 kg)     There are no preventive care reminders to display for this patient.  There are no preventive care reminders to display for this patient.  Lab Results  Component Value Date   TSH 4.97 (H) 09/05/2020   Lab Results  Component Value Date   WBC 2.7 (L) 09/05/2020   HGB 11.9 (L) 09/05/2020   HCT 34.6 (L) 09/05/2020   MCV 97.7 09/05/2020   PLT 131.0 (L) 09/05/2020   Lab Results  Component Value Date   NA 142 09/05/2020   K 3.8 09/05/2020   CO2 32 09/05/2020   GLUCOSE 90 09/05/2020   BUN 13 09/05/2020   CREATININE 1.05 09/05/2020   BILITOT 0.8 05/13/2020   ALKPHOS 86 05/13/2020   AST 13 05/13/2020   ALT 9 05/13/2020   PROT 6.4 05/13/2020   ALBUMIN 4.5 05/13/2020   CALCIUM 9.0 09/05/2020   ANIONGAP 7 08/06/2020  GFR 67.35 09/05/2020   Lab Results  Component Value Date   CHOL 137 05/13/2020   Lab Results  Component Value Date   HDL 67 05/13/2020   Lab Results  Component Value Date   LDLCALC 54 05/13/2020   Lab Results  Component Value Date   TRIG 83 05/13/2020   Lab Results  Component Value Date   CHOLHDL 2.0 05/13/2020   No results found for: HGBA1C    Assessment & Plan:   Problem List Items Addressed This Visit   None   Visit Diagnoses    Hypokalemia     -  Primary   Relevant Medications   potassium chloride SA (KLOR-CON) 20 MEQ tablet   Other Relevant Orders   Basic metabolic panel (Completed)   Hypothyroidism, unspecified type       Relevant Medications   levothyroxine (SYNTHROID) 75 MCG tablet   Other Relevant Orders   CBC (Completed)   TSH (Completed)   Pancytopenia (HCC)       Relevant Orders   CBC w/Diff      Meds ordered this encounter  Medications  . potassium chloride SA (KLOR-CON) 20 MEQ tablet    Sig: Take 1 tablet (20 mEq total) by mouth daily.    Dispense:  30 tablet    Refill:  3  . levothyroxine (SYNTHROID) 75 MCG tablet    Sig: Take 1 tablet (75 mcg total) by mouth daily.    Dispense:  90 tablet    Refill:  1    Follow-up: Return in about 3 months (around 12/06/2020).  Will probably be increasing levothyroxine.  Advised patient that it is best taking on a fasting stomach first thing in the morning avoiding food for 30 minutes of coffee for 1 hour.  May be adding potassium supplementation.  Awaiting today's lab results.  Libby Maw, MD

## 2020-09-06 MED ORDER — POTASSIUM CHLORIDE CRYS ER 20 MEQ PO TBCR: 20.0000 meq | EXTENDED_RELEASE_TABLET | Freq: Every day | ORAL | 3 refills | Status: DC

## 2020-09-06 MED ORDER — LEVOTHYROXINE SODIUM 75 MCG PO TABS: 75.0000 ug | ORAL_TABLET | Freq: Every day | ORAL | 1 refills | Status: DC

## 2020-09-06 NOTE — Addendum Note (Signed)
Addended by: Jon Billings on: 09/06/2020 08:13 AM   Modules accepted: Orders

## 2020-09-18 ENCOUNTER — Telehealth: Payer: Self-pay

## 2020-09-18 ENCOUNTER — Other Ambulatory Visit: Payer: Self-pay

## 2020-09-18 DIAGNOSIS — I10 Essential (primary) hypertension: Secondary | ICD-10-CM

## 2020-09-18 MED ORDER — FUROSEMIDE 20 MG PO TABS: 20.0000 mg | ORAL_TABLET | Freq: Every day | ORAL | 0 refills | Status: DC

## 2020-09-18 NOTE — Telephone Encounter (Signed)
Done

## 2020-09-18 NOTE — Telephone Encounter (Signed)
Pt is requesting a refill for furosemide (LASIX) 20 MG tablet  Send to [facility] at [facility] you

## 2020-10-02 DIAGNOSIS — Z85810 Personal history of malignant neoplasm of tongue: Secondary | ICD-10-CM | POA: Diagnosis not present

## 2020-10-02 DIAGNOSIS — R1314 Dysphagia, pharyngoesophageal phase: Secondary | ICD-10-CM | POA: Insufficient documentation

## 2020-10-03 ENCOUNTER — Ambulatory Visit: Payer: Medicare HMO | Attending: Internal Medicine

## 2020-10-03 DIAGNOSIS — Z23 Encounter for immunization: Secondary | ICD-10-CM

## 2020-10-03 NOTE — Progress Notes (Signed)
   Covid-19 Vaccination Clinic  Name:  ISON WICHMANN    MRN: [id number] DOB: July 18, 1940  10/03/2020  Mr. Atallah was observed post Covid-19 immunization for 15 minutes without incident. He was provided with Vaccine Information Sheet and instruction to access the V-Safe system.   Mr. Sida was instructed to call 911 with any severe reactions post vaccine: Marland Kitchen Difficulty breathing  . Swelling of face and throat  . A fast heartbeat  . A bad rash all over body  . Dizziness and weakness   Immunizations Administered    Name Date Dose VIS Date Route   PFIZER Comrnaty(Gray TOP) Covid-19 Vaccine 10/03/2020  2:50 PM 0.3 mL 04/11/2020 Intramuscular   Manufacturer: [employer]   Lot: [id number]   NDC: [phone]

## 2020-10-07 ENCOUNTER — Other Ambulatory Visit (HOSPITAL_BASED_OUTPATIENT_CLINIC_OR_DEPARTMENT_OTHER): Payer: Self-pay

## 2020-10-07 MED ORDER — PFIZER-BIONT COVID-19 VAC-TRIS 30 MCG/0.3ML IM SUSP
INTRAMUSCULAR | 0 refills | Status: DC
  Filled 2020-10-07: qty 0.3, 1d supply, fill #0

## 2020-10-17 DIAGNOSIS — M79671 Pain in right foot: Secondary | ICD-10-CM | POA: Diagnosis not present

## 2020-10-17 DIAGNOSIS — L600 Ingrowing nail: Secondary | ICD-10-CM | POA: Diagnosis not present

## 2020-10-22 ENCOUNTER — Other Ambulatory Visit: Payer: Self-pay

## 2020-10-22 ENCOUNTER — Encounter: Payer: Self-pay | Admitting: Family Medicine

## 2020-10-22 ENCOUNTER — Ambulatory Visit (INDEPENDENT_AMBULATORY_CARE_PROVIDER_SITE_OTHER): Payer: Medicare HMO | Admitting: Family Medicine

## 2020-10-22 VITALS — BP 136/80 | HR 58 | Temp 97.5°F | Ht 69.0 in | Wt 159.8 lb

## 2020-10-22 DIAGNOSIS — C4441 Basal cell carcinoma of skin of scalp and neck: Secondary | ICD-10-CM

## 2020-10-22 DIAGNOSIS — R0989 Other specified symptoms and signs involving the circulatory and respiratory systems: Secondary | ICD-10-CM

## 2020-10-22 MED ORDER — FUROSEMIDE 20 MG PO TABS: 20.0000 mg | ORAL_TABLET | Freq: Every day | ORAL | 3 refills | Status: DC

## 2020-10-22 NOTE — Progress Notes (Signed)
[facility] [address] [place] [address] Dept: [phone] Dept Fax: [phone]  Office Visit  Subjective:    Patient ID: [name], male    DOB: 13-Aug-1940, 80 y.o..   MRN: [id number]  Chief Complaint  Patient presents with   Acute Visit    C/o having a cyst or boil on the back of head 2-3 weeks. Spot itches, and drains.   History of Present Illness:  Patient is in today for evaluation of a "cyst" on his occipital scalp. He notes the lesion has been present for about 3 weeks. He has seen a small amount of bleeding from the site. He thinks he had an issue with this same lesion several months ago. He admits to have had several other cancerous lesions removed by a dermatologist in the past. He called his dermatologist office, but they could not get him in until August.  Also, he request a refill of his Lasix. He notes he was started on this some years ago by a nephrologist.  Past Medical History: Patient Active Problem List   Diagnosis Date Noted   Pharyngoesophageal dysphagia 10/02/2020   GERD (gastroesophageal reflux disease) 02/07/2019   AR (allergic rhinitis) 02/07/2019   Anemia 01/11/2019   Lumbar radiculopathy 01/11/2018   Transient alteration of awareness 01/06/2018   Abnormal head CT 01/06/2018   Labile hypertension 09/18/2016   Primary open angle glaucoma (POAG) of right eye, moderate stage 05/06/2016   Acquired hypothyroidism 08/30/2015   History of laryngeal cancer 08/30/2015   Hypercholesteremia 08/30/2015   History of cancer of lingual tonsil 08/30/2015   Lumbar stenosis with neurogenic claudication 03/11/2012   Past Surgical History:  Procedure Laterality Date   ANTERIOR LAT LUMBAR FUSION  03/11/2012   Procedure: ANTERIOR LATERAL LUMBAR FUSION 1 LEVEL;  Surgeon: Charlie Pitter, MD;  Location: [facility];  Service: Neurosurgery;  Laterality: Left;  Left lumbar four-five extreme lumbar interbody  fusion with percutaneous pedicle screws    BACK SURGERY     ruptured disc repair   COLONOSCOPY W/ POLYPECTOMY     EYE SURGERY     left cataract   HERNIA REPAIR     INGUINAL HERNIA REPAIR Right 05/22/2014   Procedure: REPAIR RECURRENT RIGHT INGUINAL HERNIA;  Surgeon: Doreen Salvage, MD;  Location: [facility];  Service: General;  Laterality: Right;   INSERTION OF MESH Right 05/22/2014   Procedure: INSERTION OF MESH;  Surgeon: Doreen Salvage, MD;  Location: [facility];  Service: General;  Laterality: Right;   INSERTION OF MESH Left 08/06/2020   Procedure: INSERTION OF MESH;  Surgeon: [name], MD;  Location: [facility];  Service: General;  Laterality: Left;   LUMBAR LAMINECTOMY/DECOMPRESSION MICRODISCECTOMY Left 01/11/2018   Procedure: Laminectomy and Foraminotomy - Left - Lumbar Three-Lumbar Four - Lumbar Five-Sacral One;  Surgeon: Earnie Larsson, MD;  Location: [facility];  Service: Neurosurgery;  Laterality: Left;  Laminectomy and Foraminotomy - Left - Lumbar Three-Lumbar Four - Lumbar Five-Sacral One   LUMBAR PERCUTANEOUS PEDICLE SCREW 1 LEVEL  03/11/2012   Procedure: LUMBAR PERCUTANEOUS PEDICLE SCREW 1 LEVEL;  Surgeon: Charlie Pitter, MD;  Location: [facility];  Service: Neurosurgery;  Laterality: Left;  Left lumbar four-five extreme lumbar interbody fusion with percutaneous pedicle screws    RADICAL NECK DISSECTION  1989   ROTATOR CUFF REPAIR     right   TONSILLECTOMY     XI ROBOTIC ASSISTED VENTRAL HERNIA N/A 08/06/2020   Procedure: XI ROBOTIC  ASSISTED INCISIONAL FLANK  HERNIA REPAIR WITH MESH;  Surgeon: [name], MD;  Location: [facility];  Service: General;  Laterality: N/A;   No family history on file.  Outpatient Medications Prior to Visit  Medication Sig Dispense Refill   dorzolamide-timolol (COSOPT) 22.3-6.8 MG/ML ophthalmic solution Place 1 drop into both eyes 2 (two) times daily.     doxazosin (CARDURA) 4 MG tablet Take 1 tablet (4 mg total) by mouth daily. 90 tablet 1   escitalopram (LEXAPRO) 20 MG  tablet Take 1 tablet (20 mg total) by mouth daily. 180 tablet 1   fluticasone (FLONASE) 50 MCG/ACT nasal spray Place 1 spray into both nostrils daily. (Patient taking differently: Place 1 spray into both nostrils daily as needed for allergies.) 16 g 6   ipratropium (ATROVENT) 0.06 % nasal spray Place 2 sprays into both nostrils 3 (three) times daily as needed (allergies).     latanoprost (XALATAN) 0.005 % ophthalmic solution Place 1 drop into the left eye at bedtime.     levothyroxine (SYNTHROID) 75 MCG tablet Take 1 tablet (75 mcg total) by mouth daily. 90 tablet 1   loratadine (ALAVERT) 10 MG dissolvable tablet Take 10 mg by mouth daily as needed for allergies.     losartan (COZAAR) 100 MG tablet Take 1 tablet (100 mg total) by mouth daily. 90 tablet 1   naproxen sodium (ALEVE) 220 MG tablet Take 220 mg by mouth daily as needed (pain).     potassium chloride SA (KLOR-CON) 20 MEQ tablet Take 1 tablet (20 mEq total) by mouth daily. 30 tablet 3   simvastatin (ZOCOR) 20 MG tablet Take 1 tablet (20 mg total) by mouth every evening. 90 tablet 3   furosemide (LASIX) 20 MG tablet Take 1 tablet (20 mg total) by mouth daily. 30 tablet 0   COVID-19 mRNA Vac-TriS, Pfizer, (PFIZER-BIONT COVID-19 VAC-TRIS) SUSP injection Inject into the muscle. (Patient not taking: Reported on 10/22/2020) 0.3 mL 0   No facility-administered medications prior to visit.   Allergies  Allergen Reactions   Amlodipine Other (See Comments)    Swollen gums     Bimatoprost Other (See Comments)    Toxicity    Brimonidine Tartrate-Timolol Other (See Comments)    Toxicity   Codeine Nausea Only    Objective:   Today's Vitals   10/22/20 1112  BP: 136/80  Pulse: (!) 58  Temp: (!) 97.5 F (36.4 C)  TempSrc: Temporal  SpO2: 98%  Weight: 159 lb 12.8 oz (72.5 kg)  Height: 5\' 9"  (1.753 m)   Body mass index is 23.6 kg/m.   General: Well developed, well nourished. No acute distress. Skin: There is 5 mm raised lesion on the  occipital scalp with central eschar. The edge has a rolled   appearance and +/- some telangectasias. Psych: Alert and oriented. Normal mood and affect.  There are no preventive care reminders to display for this patient.    Assessment & Plan:   1. Basal cell carcinoma (BCC) of scalp The lesion on the scalp is consistent with a basal cell carcinoma. I discussed the need to excise this lesion with local anesthesia. He is agreeable. I will have him scheduled to return for this procedure.  2. Labile hypertension Blood pressure is adequately controlled at present. I reviewed old consult notes with nephrology and note he had been managed for labile hypertension. There is no evidence of him having any CHF. He is currently on doxazosin, losartan, and furosemide for his BP control.  -  furosemide (LASIX) 20 MG tablet; Take 1 tablet (20 mg total) by mouth daily.  Dispense: 90 tablet; Refill: 3  Haydee Salter, MD

## 2020-10-28 ENCOUNTER — Ambulatory Visit: Payer: Medicare HMO | Admitting: Family Medicine

## 2020-10-28 DIAGNOSIS — Z85828 Personal history of other malignant neoplasm of skin: Secondary | ICD-10-CM | POA: Diagnosis not present

## 2020-10-28 DIAGNOSIS — D225 Melanocytic nevi of trunk: Secondary | ICD-10-CM | POA: Diagnosis not present

## 2020-10-28 DIAGNOSIS — C4441 Basal cell carcinoma of skin of scalp and neck: Secondary | ICD-10-CM | POA: Diagnosis not present

## 2020-10-28 DIAGNOSIS — C44319 Basal cell carcinoma of skin of other parts of face: Secondary | ICD-10-CM | POA: Diagnosis not present

## 2020-10-28 DIAGNOSIS — D485 Neoplasm of uncertain behavior of skin: Secondary | ICD-10-CM | POA: Diagnosis not present

## 2020-10-28 DIAGNOSIS — L821 Other seborrheic keratosis: Secondary | ICD-10-CM | POA: Diagnosis not present

## 2020-10-28 DIAGNOSIS — L570 Actinic keratosis: Secondary | ICD-10-CM | POA: Diagnosis not present

## 2020-10-28 DIAGNOSIS — L723 Sebaceous cyst: Secondary | ICD-10-CM | POA: Diagnosis not present

## 2020-10-29 DIAGNOSIS — H401132 Primary open-angle glaucoma, bilateral, moderate stage: Secondary | ICD-10-CM | POA: Diagnosis not present

## 2020-10-29 DIAGNOSIS — H26492 Other secondary cataract, left eye: Secondary | ICD-10-CM | POA: Diagnosis not present

## 2020-10-29 DIAGNOSIS — H04123 Dry eye syndrome of bilateral lacrimal glands: Secondary | ICD-10-CM | POA: Diagnosis not present

## 2020-10-29 DIAGNOSIS — Z961 Presence of intraocular lens: Secondary | ICD-10-CM | POA: Diagnosis not present

## 2020-12-06 ENCOUNTER — Encounter: Payer: Self-pay | Admitting: Family Medicine

## 2020-12-06 ENCOUNTER — Ambulatory Visit (INDEPENDENT_AMBULATORY_CARE_PROVIDER_SITE_OTHER): Payer: Medicare HMO | Admitting: Family Medicine

## 2020-12-06 ENCOUNTER — Other Ambulatory Visit: Payer: Self-pay

## 2020-12-06 VITALS — BP 146/84 | HR 69 | Temp 97.3°F | Ht 69.0 in | Wt 160.2 lb

## 2020-12-06 DIAGNOSIS — I10 Essential (primary) hypertension: Secondary | ICD-10-CM

## 2020-12-06 DIAGNOSIS — R1314 Dysphagia, pharyngoesophageal phase: Secondary | ICD-10-CM | POA: Diagnosis not present

## 2020-12-06 DIAGNOSIS — E039 Hypothyroidism, unspecified: Secondary | ICD-10-CM | POA: Diagnosis not present

## 2020-12-06 DIAGNOSIS — E876 Hypokalemia: Secondary | ICD-10-CM

## 2020-12-06 DIAGNOSIS — J309 Allergic rhinitis, unspecified: Secondary | ICD-10-CM | POA: Diagnosis not present

## 2020-12-06 LAB — URINALYSIS, ROUTINE W REFLEX MICROSCOPIC
Bilirubin Urine: NEGATIVE
Hgb urine dipstick: NEGATIVE
Ketones, ur: NEGATIVE
Leukocytes,Ua: NEGATIVE
Nitrite: NEGATIVE
Specific Gravity, Urine: 1.01 (ref 1.000–1.030)
Total Protein, Urine: NEGATIVE
Urine Glucose: NEGATIVE
Urobilinogen, UA: 1 (ref 0.0–1.0)
pH: 6.5 (ref 5.0–8.0)

## 2020-12-06 LAB — BASIC METABOLIC PANEL
BUN: 15 mg/dL (ref 6–23)
CO2: 31 mEq/L (ref 19–32)
Calcium: 9 mg/dL (ref 8.4–10.5)
Chloride: 104 mEq/L (ref 96–112)
Creatinine, Ser: 1.02 mg/dL (ref 0.40–1.50)
GFR: 69.62 mL/min (ref >60.00)
Glucose, Bld: 96 mg/dL (ref 70–99)
Potassium: 3.6 mEq/L (ref 3.5–5.1)
Sodium: 140 mEq/L (ref 135–145)

## 2020-12-06 LAB — CBC
HCT: 36.3 % — ABNORMAL LOW (ref 39.0–52.0)
Hemoglobin: 12.5 g/dL — ABNORMAL LOW (ref 13.0–17.0)
MCHC: 34.4 g/dL (ref 30.0–36.0)
MCV: 95.6 fl (ref 78.0–100.0)
Platelets: 127 10*3/uL — ABNORMAL LOW (ref 150.0–400.0)
RBC: 3.8 Mil/uL — ABNORMAL LOW (ref 4.22–5.81)
RDW: 13.1 % (ref 11.5–15.5)
WBC: 3.4 10*3/uL — ABNORMAL LOW (ref 4.0–10.5)

## 2020-12-06 LAB — TSH: TSH: 0.9 u[IU]/mL (ref 0.35–5.50)

## 2020-12-06 MED ORDER — CHLORTHALIDONE 25 MG PO TABS
25.0000 mg | ORAL_TABLET | Freq: Every day | ORAL | 1 refills | Status: DC
Start: 2020-12-06 — End: 2021-05-07

## 2020-12-06 MED ORDER — LOSARTAN POTASSIUM 100 MG PO TABS: 100.0000 mg | ORAL_TABLET | Freq: Every day | ORAL | 1 refills | Status: DC

## 2020-12-06 MED ORDER — FLUTICASONE PROPIONATE 50 MCG/ACT NA SUSP: 1.0000 | Freq: Every day | NASAL | 6 refills | Status: DC

## 2020-12-06 NOTE — Progress Notes (Signed)
Established Patient Office Visit  Subjective:  Patient ID: [name], male    DOB: 16-Dec-1940  Age: 80 y.o. MRN: [id number]  CC:  Chief Complaint  Patient presents with   Follow-up    3 month follow up, patient has concerns about weight loss, feels like he is on to much fluid medications.     HPI [name] presents for follow-up of hypokalemia, hypertension, weight loss, postnasal drip with sore throat.  11 pound weight loss since last visit.  Patient has copious and ongoing postnasal drip.  He has only been using his Flonase as needed.  He is not certain as to why he had been placed on furosemide.  He has no history of congestive heart failure or lower extremity edema.  He thinks it may have had something to do with a kidney issue in his past.  Renal function has been normal more recently.  He is taking his supplemental potassium.  He remains quite active in his yard cutting down trees and moving gravel.  He is sure to go back inside that morning to avoid the heat.  Past Medical History:  Diagnosis Date   Arthritis    BPH (benign prostatic hyperplasia)    Bruises easily    Cancer (HCC)    throat   Complication of anesthesia    difficult urination after anesthesia   Dizziness    GERD (gastroesophageal reflux disease)    denies   Glaucoma    left worse than right   H/O hiatal hernia    Heart murmur    pt. reports  doctors can't hear it   Hyperlipemia    Hypertension    takes meds daily   Hypothyroidism    Transient alteration of awareness    ~ 12/27/17, evaluated by neurologist Dr. Felecia Shelling 01/06/18   Urinary retention     Past Surgical History:  Procedure Laterality Date   ANTERIOR LAT LUMBAR FUSION  03/11/2012   Procedure: ANTERIOR LATERAL LUMBAR FUSION 1 LEVEL;  Surgeon: Charlie Pitter, MD;  Location: [facility];  Service: Neurosurgery;  Laterality: Left;  Left lumbar four-five extreme lumbar interbody fusion with percutaneous pedicle screws    BACK SURGERY      ruptured disc repair   COLONOSCOPY W/ POLYPECTOMY     EYE SURGERY     left cataract   HERNIA REPAIR     INGUINAL HERNIA REPAIR Right 05/22/2014   Procedure: REPAIR RECURRENT RIGHT INGUINAL HERNIA;  Surgeon: Doreen Salvage, MD;  Location: [facility];  Service: General;  Laterality: Right;   INSERTION OF MESH Right 05/22/2014   Procedure: INSERTION OF MESH;  Surgeon: Doreen Salvage, MD;  Location: [facility];  Service: General;  Laterality: Right;   INSERTION OF MESH Left 08/06/2020   Procedure: INSERTION OF MESH;  Surgeon: [name], MD;  Location: [facility];  Service: General;  Laterality: Left;   LUMBAR LAMINECTOMY/DECOMPRESSION MICRODISCECTOMY Left 01/11/2018   Procedure: Laminectomy and Foraminotomy - Left - Lumbar Three-Lumbar Four - Lumbar Five-Sacral One;  Surgeon: Earnie Larsson, MD;  Location: [facility];  Service: Neurosurgery;  Laterality: Left;  Laminectomy and Foraminotomy - Left - Lumbar Three-Lumbar Four - Lumbar Five-Sacral One   LUMBAR PERCUTANEOUS PEDICLE SCREW 1 LEVEL  03/11/2012   Procedure: LUMBAR PERCUTANEOUS PEDICLE SCREW 1 LEVEL;  Surgeon: Charlie Pitter, MD;  Location: [facility];  Service: Neurosurgery;  Laterality: Left;  Left lumbar four-five extreme lumbar interbody fusion with percutaneous pedicle screws  RADICAL NECK DISSECTION  1989   ROTATOR CUFF REPAIR     right   TONSILLECTOMY     XI ROBOTIC ASSISTED VENTRAL HERNIA N/A 08/06/2020   Procedure: XI ROBOTIC ASSISTED INCISIONAL FLANK  HERNIA REPAIR WITH MESH;  Surgeon: [name], MD;  Location: [facility];  Service: General;  Laterality: N/A;    No family history on file.  Social History   Socioeconomic History   Marital status: Married    Spouse name: Not on file   Number of children: Not on file   Years of education: Not on file   Highest education level: Not on file  Occupational History   Not on file  Tobacco Use   Smoking status: Former   Smokeless tobacco: Never  Vaping Use   Vaping Use: Never used  Substance and  Sexual Activity   Alcohol use: Yes    Comment: social 1 or 2 beers a week   Drug use: No   Sexual activity: Not Currently  Other Topics Concern   Not on file  Social History Narrative   Not on file   Social Determinants of Health   Financial Resource Strain: Not on file  Food Insecurity: Not on file  Transportation Needs: Not on file  Physical Activity: Not on file  Stress: Not on file  Social Connections: Not on file  Intimate Partner Violence: Not on file    Outpatient Medications Prior to Visit  Medication Sig Dispense Refill   dorzolamide-timolol (COSOPT) 22.3-6.8 MG/ML ophthalmic solution Place 1 drop into both eyes 2 (two) times daily.     doxazosin (CARDURA) 4 MG tablet Take 1 tablet (4 mg total) by mouth daily. 90 tablet 1   escitalopram (LEXAPRO) 20 MG tablet Take 1 tablet (20 mg total) by mouth daily. 180 tablet 1   ipratropium (ATROVENT) 0.06 % nasal spray Place 2 sprays into both nostrils 3 (three) times daily as needed (allergies).     latanoprost (XALATAN) 0.005 % ophthalmic solution Place 1 drop into the left eye at bedtime.     levothyroxine (SYNTHROID) 75 MCG tablet Take 1 tablet (75 mcg total) by mouth daily. 90 tablet 1   loratadine (ALAVERT) 10 MG dissolvable tablet Take 10 mg by mouth daily as needed for allergies.     naproxen sodium (ALEVE) 220 MG tablet Take 220 mg by mouth daily as needed (pain).     potassium chloride SA (KLOR-CON) 20 MEQ tablet Take 1 tablet (20 mEq total) by mouth daily. 30 tablet 3   simvastatin (ZOCOR) 20 MG tablet Take 1 tablet (20 mg total) by mouth every evening. 90 tablet 3   fluticasone (FLONASE) 50 MCG/ACT nasal spray Place 1 spray into both nostrils daily. (Patient taking differently: Place 1 spray into both nostrils daily as needed for allergies.) 16 g 6   furosemide (LASIX) 20 MG tablet Take 1 tablet (20 mg total) by mouth daily. 90 tablet 3   losartan (COZAAR) 100 MG tablet Take 1 tablet (100 mg total) by mouth daily. 90  tablet 1   No facility-administered medications prior to visit.    Allergies  Allergen Reactions   Amlodipine Other (See Comments)    Swollen gums     Bimatoprost Other (See Comments)    Toxicity    Brimonidine Tartrate-Timolol Other (See Comments)    Toxicity   Codeine Nausea Only    ROS Review of Systems  Constitutional:  Positive for unexpected weight change. Negative for diaphoresis, fatigue and fever.  HENT:  Positive for postnasal drip and sore throat.   Eyes:  Negative for photophobia and visual disturbance.  Respiratory: Negative.    Cardiovascular: Negative.   Gastrointestinal:  Negative for anal bleeding, blood in stool, constipation, nausea and vomiting.  Genitourinary:  Negative for difficulty urinating, frequency, hematuria and urgency.  Musculoskeletal:  Negative for gait problem and joint swelling.  Skin:  Negative for pallor and rash.  Neurological:  Negative for weakness and headaches.  Hematological:  Does not bruise/bleed easily.  Psychiatric/Behavioral: Negative.    Depression screen [facility] 2/9 12/06/2020 09/05/2020 09/05/2020  Decreased Interest 0 0 0  Down, Depressed, Hopeless 0 0 0  PHQ - 2 Score 0 0 0  Altered sleeping - 0 -  Tired, decreased energy - 0 -  Change in appetite - 0 -  Feeling bad or failure about yourself  - 0 -  Trouble concentrating - 0 -  Moving slowly or fidgety/restless - 0 -  Suicidal thoughts - 0 -  PHQ-9 Score - 0 -  Difficult doing work/chores - Not difficult at all -       Objective:    Physical Exam Constitutional:      General: He is not in acute distress.    Appearance: Normal appearance. He is normal weight. He is not ill-appearing, toxic-appearing or diaphoretic.  HENT:     Head: Normocephalic and atraumatic.     Right Ear: Tympanic membrane, ear canal and external ear normal.     Left Ear: Tympanic membrane, ear canal and external ear normal.     Mouth/Throat:     Mouth: Mucous membranes are moist.     Pharynx:  Oropharynx is clear. No oropharyngeal exudate or posterior oropharyngeal erythema.  Eyes:     General: No scleral icterus.       Right eye: No discharge.        Left eye: No discharge.     Extraocular Movements: Extraocular movements intact.     Conjunctiva/sclera: Conjunctivae normal.     Pupils: Pupils are equal, round, and reactive to light.  Cardiovascular:     Rate and Rhythm: Normal rate and regular rhythm.  Pulmonary:     Effort: Pulmonary effort is normal.     Breath sounds: Normal breath sounds.  Abdominal:     General: Bowel sounds are normal.  Musculoskeletal:     Cervical back: No rigidity or tenderness.  Lymphadenopathy:     Cervical: No cervical adenopathy.  Skin:    General: Skin is warm and dry.  Neurological:     Mental Status: He is alert and oriented to person, place, and time.  Psychiatric:        Mood and Affect: Mood normal.        Behavior: Behavior normal.    BP (!) 146/84 (BP Location: Left Arm, Patient Position: Sitting, Cuff Size: Normal)   Pulse 69   Temp (!) 97.3 F (36.3 C) (Temporal)   Ht '5\' 9"'$  (1.753 m)   Wt 160 lb 3.2 oz (72.7 kg)   SpO2 98%   BMI 23.66 kg/m  Wt Readings from Last 3 Encounters:  12/06/20 160 lb 3.2 oz (72.7 kg)  10/22/20 159 lb 12.8 oz (72.5 kg)  09/05/20 164 lb 6.4 oz (74.6 kg)     Health Maintenance Due  Topic Date Due   INFLUENZA VACCINE  12/02/2020    There are no preventive care reminders to display for this patient.  Lab Results  Component Value Date  TSH 4.97 (H) 09/05/2020   Lab Results  Component Value Date   WBC 2.7 (L) 09/05/2020   HGB 11.9 (L) 09/05/2020   HCT 34.6 (L) 09/05/2020   MCV 97.7 09/05/2020   PLT 131.0 (L) 09/05/2020   Lab Results  Component Value Date   NA 142 09/05/2020   K 3.8 09/05/2020   CO2 32 09/05/2020   GLUCOSE 90 09/05/2020   BUN 13 09/05/2020   CREATININE 1.05 09/05/2020   BILITOT 0.8 05/13/2020   ALKPHOS 86 05/13/2020   AST 13 05/13/2020   ALT 9 05/13/2020    PROT 6.4 05/13/2020   ALBUMIN 4.5 05/13/2020   CALCIUM 9.0 09/05/2020   ANIONGAP 7 08/06/2020   GFR 67.35 09/05/2020   Lab Results  Component Value Date   CHOL 137 05/13/2020   Lab Results  Component Value Date   HDL 67 05/13/2020   Lab Results  Component Value Date   LDLCALC 54 05/13/2020   Lab Results  Component Value Date   TRIG 83 05/13/2020   Lab Results  Component Value Date   CHOLHDL 2.0 05/13/2020   No results found for: HGBA1C    Assessment & Plan:   Problem List Items Addressed This Visit       Respiratory   AR (allergic rhinitis)   Relevant Medications   fluticasone (FLONASE) 50 MCG/ACT nasal spray     Digestive   Pharyngoesophageal dysphagia   Other Visit Diagnoses     Hypothyroidism, unspecified type    -  Primary   Relevant Orders   TSH   Essential hypertension       Relevant Medications   chlorthalidone (HYGROTON) 25 MG tablet   losartan (COZAAR) 100 MG tablet   Other Relevant Orders   Basic metabolic panel   CBC   Urinalysis, Routine w reflex microscopic   Hypokalemia       Relevant Orders   Basic metabolic panel       Meds ordered this encounter  Medications   fluticasone (FLONASE) 50 MCG/ACT nasal spray    Sig: Place 1 spray into both nostrils daily.    Dispense:  16 g    Refill:  6   chlorthalidone (HYGROTON) 25 MG tablet    Sig: Take 1 tablet (25 mg total) by mouth daily.    Dispense:  90 tablet    Refill:  1   losartan (COZAAR) 100 MG tablet    Sig: Take 1 tablet (100 mg total) by mouth daily.    Dispense:  90 tablet    Refill:  1    Follow-up: Return in about 1 month (around 01/06/2021), or stop lasix and start chlorthalidone..  Discontinue furosemide and start chlorthalidone for better blood pressure control.  Continue potassium.  Continue losartan.  Urged patient to use Flonase every single day to help control his postnasal drip.  Believe this may be affecting his appetite and leading to the soreness in his throat  which could be responsible for his weight loss.  He will see ENT in 2 weeks for reevaluation of his oropharyngeal dysphagia.  Advised him to have an Ensure twice daily.  Libby Maw, MD

## 2020-12-25 DIAGNOSIS — H60542 Acute eczematoid otitis externa, left ear: Secondary | ICD-10-CM | POA: Diagnosis not present

## 2020-12-25 DIAGNOSIS — R1319 Other dysphagia: Secondary | ICD-10-CM | POA: Diagnosis not present

## 2020-12-25 DIAGNOSIS — J310 Chronic rhinitis: Secondary | ICD-10-CM | POA: Diagnosis not present

## 2020-12-27 ENCOUNTER — Telehealth: Payer: Self-pay | Admitting: Family Medicine

## 2020-12-27 NOTE — Telephone Encounter (Signed)
Pharmacy changed

## 2020-12-27 NOTE — Telephone Encounter (Signed)
Pt came into the office requesting we change his pharmacy to [facility] in [place].   [address] [place]

## 2021-01-07 ENCOUNTER — Encounter: Payer: Self-pay | Admitting: Family Medicine

## 2021-01-07 ENCOUNTER — Other Ambulatory Visit: Payer: Self-pay

## 2021-01-07 ENCOUNTER — Ambulatory Visit (INDEPENDENT_AMBULATORY_CARE_PROVIDER_SITE_OTHER): Payer: Medicare HMO | Admitting: Family Medicine

## 2021-01-07 VITALS — BP 136/74 | HR 59 | Temp 97.6°F | Ht 69.0 in | Wt 160.0 lb

## 2021-01-07 DIAGNOSIS — E039 Hypothyroidism, unspecified: Secondary | ICD-10-CM | POA: Diagnosis not present

## 2021-01-07 DIAGNOSIS — Z0000 Encounter for general adult medical examination without abnormal findings: Secondary | ICD-10-CM

## 2021-01-07 DIAGNOSIS — Z23 Encounter for immunization: Secondary | ICD-10-CM

## 2021-01-07 DIAGNOSIS — I10 Essential (primary) hypertension: Secondary | ICD-10-CM | POA: Diagnosis not present

## 2021-01-07 DIAGNOSIS — R42 Dizziness and giddiness: Secondary | ICD-10-CM | POA: Diagnosis not present

## 2021-01-07 LAB — URINALYSIS, ROUTINE W REFLEX MICROSCOPIC
Bilirubin Urine: NEGATIVE
Hgb urine dipstick: NEGATIVE
Ketones, ur: NEGATIVE
Leukocytes,Ua: NEGATIVE
Nitrite: NEGATIVE
RBC / HPF: NONE SEEN (ref 0–?)
Specific Gravity, Urine: 1.01 (ref 1.000–1.030)
Total Protein, Urine: NEGATIVE
Urine Glucose: NEGATIVE
Urobilinogen, UA: 0.2 (ref 0.0–1.0)
pH: 7 (ref 5.0–8.0)

## 2021-01-07 MED ORDER — DOXAZOSIN MESYLATE 2 MG PO TABS: 2.0000 mg | ORAL_TABLET | Freq: Every day | ORAL | 1 refills | Status: DC

## 2021-01-07 NOTE — Addendum Note (Signed)
Addended by: Jon Billings on: 01/07/2021 01:38 PM   Modules accepted: Orders

## 2021-01-07 NOTE — Progress Notes (Addendum)
Established Patient Office Visit  Subjective:  Patient ID: [name], male    DOB: 1941/01/12  Age: 80 y.o. MRN: [id number]  CC:  Chief Complaint  Patient presents with   Follow-up    1 month f/u, c/o having low BP last week but today it is back up to normal.     HPI [name] presents for follow-up of hypertension, lightheadedness and hypothyroidism.  Hypothyroidism is well adjusted on his current dose of levothyroxine at 75 mcg.  Blood pressure was running low last week.  He had episodes of lightheadedness as well.  He is tolerating the change over from Lasix to chlorthalidone.  He remembers that nephrology had started him on Lasix and doxazosin in the past.  Had seen them in the past for labile hypertension, no longer has that issue.  He has no problems with urine flow.  He did follow-up with ENT and they instructed him to discontinue Astelin and Flonase.  They suggested that he may need a surgical procedure to relieve his postnasal drip.  Past Medical History:  Diagnosis Date   Arthritis    BPH (benign prostatic hyperplasia)    Bruises easily    Cancer (HCC)    throat   Complication of anesthesia    difficult urination after anesthesia   Dizziness    GERD (gastroesophageal reflux disease)    denies   Glaucoma    left worse than right   H/O hiatal hernia    Heart murmur    pt. reports  doctors can't hear it   Hyperlipemia    Hypertension    takes meds daily   Hypothyroidism    Transient alteration of awareness    ~ 12/27/17, evaluated by neurologist Dr. Felecia Shelling 01/06/18   Urinary retention     Past Surgical History:  Procedure Laterality Date   ANTERIOR LAT LUMBAR FUSION  03/11/2012   Procedure: ANTERIOR LATERAL LUMBAR FUSION 1 LEVEL;  Surgeon: Charlie Pitter, MD;  Location: [place] [facility];  Service: Neurosurgery;  Laterality: Left;  Left lumbar four-five extreme lumbar interbody fusion with percutaneous pedicle screws    BACK SURGERY     ruptured disc repair    COLONOSCOPY W/ POLYPECTOMY     EYE SURGERY     left cataract   HERNIA REPAIR     INGUINAL HERNIA REPAIR Right 05/22/2014   Procedure: REPAIR RECURRENT RIGHT INGUINAL HERNIA;  Surgeon: Doreen Salvage, MD;  Location: [place];  Service: General;  Laterality: Right;   INSERTION OF MESH Right 05/22/2014   Procedure: INSERTION OF MESH;  Surgeon: Doreen Salvage, MD;  Location: [place];  Service: General;  Laterality: Right;   INSERTION OF MESH Left 08/06/2020   Procedure: INSERTION OF MESH;  Surgeon: [name], MD;  Location: [place];  Service: General;  Laterality: Left;   LUMBAR LAMINECTOMY/DECOMPRESSION MICRODISCECTOMY Left 01/11/2018   Procedure: Laminectomy and Foraminotomy - Left - Lumbar Three-Lumbar Four - Lumbar Five-Sacral One;  Surgeon: Earnie Larsson, MD;  Location: [place];  Service: Neurosurgery;  Laterality: Left;  Laminectomy and Foraminotomy - Left - Lumbar Three-Lumbar Four - Lumbar Five-Sacral One   LUMBAR PERCUTANEOUS PEDICLE SCREW 1 LEVEL  03/11/2012   Procedure: LUMBAR PERCUTANEOUS PEDICLE SCREW 1 LEVEL;  Surgeon: Charlie Pitter, MD;  Location: [place] [facility];  Service: Neurosurgery;  Laterality: Left;  Left lumbar four-five extreme lumbar interbody fusion with percutaneous pedicle screws    RADICAL NECK DISSECTION  1989   ROTATOR CUFF REPAIR  right   TONSILLECTOMY     XI ROBOTIC ASSISTED VENTRAL HERNIA N/A 08/06/2020   Procedure: XI ROBOTIC ASSISTED INCISIONAL FLANK  HERNIA REPAIR WITH MESH;  Surgeon: [name], MD;  Location: [place];  Service: General;  Laterality: N/A;    No family history on file.  Social History   Socioeconomic History   Marital status: Married    Spouse name: Not on file   Number of children: Not on file   Years of education: Not on file   Highest education level: Not on file  Occupational History   Not on file  Tobacco Use   Smoking status: Former   Smokeless tobacco: Never  Vaping Use   Vaping Use: Never used  Substance and Sexual Activity   Alcohol  use: Yes    Comment: social 1 or 2 beers a week   Drug use: No   Sexual activity: Not Currently  Other Topics Concern   Not on file  Social History Narrative   Not on file   Social Determinants of Health   Financial Resource Strain: Not on file  Food Insecurity: Not on file  Transportation Needs: Not on file  Physical Activity: Not on file  Stress: Not on file  Social Connections: Not on file  Intimate Partner Violence: Not on file    Outpatient Medications Prior to Visit  Medication Sig Dispense Refill   chlorthalidone (HYGROTON) 25 MG tablet Take 1 tablet (25 mg total) by mouth daily. 90 tablet 1   dorzolamide-timolol (COSOPT) 22.3-6.8 MG/ML ophthalmic solution Place 1 drop into both eyes 2 (two) times daily.     escitalopram (LEXAPRO) 20 MG tablet Take 1 tablet (20 mg total) by mouth daily. 180 tablet 1   latanoprost (XALATAN) 0.005 % ophthalmic solution Place 1 drop into the left eye at bedtime.     levothyroxine (SYNTHROID) 75 MCG tablet Take 1 tablet (75 mcg total) by mouth daily. 90 tablet 1   loratadine (ALAVERT) 10 MG dissolvable tablet Take 10 mg by mouth daily as needed for allergies.     losartan (COZAAR) 100 MG tablet Take 1 tablet (100 mg total) by mouth daily. 90 tablet 1   naproxen sodium (ALEVE) 220 MG tablet Take 220 mg by mouth daily as needed (pain).     potassium chloride SA (KLOR-CON) 20 MEQ tablet Take 1 tablet (20 mEq total) by mouth daily. 30 tablet 3   simvastatin (ZOCOR) 20 MG tablet Take 1 tablet (20 mg total) by mouth every evening. 90 tablet 3   doxazosin (CARDURA) 4 MG tablet Take 1 tablet (4 mg total) by mouth daily. 90 tablet 1   fluticasone (FLONASE) 50 MCG/ACT nasal spray Place 1 spray into both nostrils daily. (Patient not taking: Reported on 01/07/2021) 16 g 6   ipratropium (ATROVENT) 0.06 % nasal spray Place 2 sprays into both nostrils 3 (three) times daily as needed (allergies).     No facility-administered medications prior to visit.     Allergies  Allergen Reactions   Amlodipine Other (See Comments)    Swollen gums     Bimatoprost Other (See Comments)    Toxicity    Brimonidine Tartrate-Timolol Other (See Comments)    Toxicity   Codeine Nausea Only    ROS Review of Systems  Constitutional: Negative.   HENT:  Positive for postnasal drip.   Eyes:  Negative for photophobia and visual disturbance.  Respiratory: Negative.  Negative for shortness of breath.   Cardiovascular: Negative.  Negative for  chest pain and palpitations.  Gastrointestinal: Negative.   Genitourinary:  Negative for difficulty urinating, frequency and urgency.  Neurological:  Positive for light-headedness. Negative for dizziness and weakness.  Psychiatric/Behavioral: Negative.       Objective:    Physical Exam Vitals and nursing note reviewed.  Constitutional:      General: He is not in acute distress.    Appearance: Normal appearance. He is normal weight. He is not ill-appearing, toxic-appearing or diaphoretic.  HENT:     Head: Normocephalic and atraumatic.     Right Ear: Tympanic membrane, ear canal and external ear normal.     Left Ear: Tympanic membrane, ear canal and external ear normal.     Mouth/Throat:     Mouth: Mucous membranes are moist.     Pharynx: Oropharynx is clear. No oropharyngeal exudate or posterior oropharyngeal erythema.  Eyes:     Extraocular Movements: Extraocular movements intact.     Conjunctiva/sclera: Conjunctivae normal.     Pupils: Pupils are equal, round, and reactive to light.  Neck:     Vascular: No carotid bruit.  Cardiovascular:     Rate and Rhythm: Normal rate and regular rhythm.  Pulmonary:     Effort: Pulmonary effort is normal.     Breath sounds: Normal breath sounds.  Abdominal:     General: Bowel sounds are normal.  Musculoskeletal:     Cervical back: No rigidity or tenderness.     Right lower leg: No edema.     Left lower leg: No edema.  Lymphadenopathy:     Cervical: No cervical  adenopathy.  Skin:    General: Skin is warm and dry.  Neurological:     Mental Status: He is alert and oriented to person, place, and time.  Psychiatric:        Mood and Affect: Mood normal.        Behavior: Behavior normal.    BP 136/74 (BP Location: Left Arm, Patient Position: Sitting, Cuff Size: Normal)   Pulse (!) 59   Temp 97.6 F (36.4 C) (Temporal)   Ht '5\' 9"'$  (1.753 m)   Wt 160 lb (72.6 kg)   SpO2 98%   BMI 23.63 kg/m  Wt Readings from Last 3 Encounters:  01/07/21 160 lb (72.6 kg)  12/06/20 160 lb 3.2 oz (72.7 kg)  10/22/20 159 lb 12.8 oz (72.5 kg)     There are no preventive care reminders to display for this patient.   There are no preventive care reminders to display for this patient.  Lab Results  Component Value Date   TSH 0.90 12/06/2020   Lab Results  Component Value Date   WBC 3.4 (L) 12/06/2020   HGB 12.5 (L) 12/06/2020   HCT 36.3 (L) 12/06/2020   MCV 95.6 12/06/2020   PLT 127.0 (L) 12/06/2020   Lab Results  Component Value Date   NA 140 12/06/2020   K 3.6 12/06/2020   CO2 31 12/06/2020   GLUCOSE 96 12/06/2020   BUN 15 12/06/2020   CREATININE 1.02 12/06/2020   BILITOT 0.8 05/13/2020   ALKPHOS 86 05/13/2020   AST 13 05/13/2020   ALT 9 05/13/2020   PROT 6.4 05/13/2020   ALBUMIN 4.5 05/13/2020   CALCIUM 9.0 12/06/2020   ANIONGAP 7 08/06/2020   GFR 69.62 12/06/2020   Lab Results  Component Value Date   CHOL 137 05/13/2020   Lab Results  Component Value Date   HDL 67 05/13/2020   Lab Results  Component Value  Date   LDLCALC 54 05/13/2020   Lab Results  Component Value Date   TRIG 83 05/13/2020   Lab Results  Component Value Date   CHOLHDL 2.0 05/13/2020   No results found for: HGBA1C    Assessment & Plan:   Problem List Items Addressed This Visit       Cardiovascular and Mediastinum   Essential hypertension - Primary   Relevant Medications   doxazosin (CARDURA) 2 MG tablet   Other Relevant Orders   Urinalysis,  Routine w reflex microscopic     Endocrine   Acquired hypothyroidism     Other   Lightheadedness   Other Visit Diagnoses     Healthcare maintenance       Relevant Orders   Flu vaccine HIGH DOSE PF (Fluzone High dose) (Completed)       Meds ordered this encounter  Medications   doxazosin (CARDURA) 2 MG tablet    Sig: Take 1 tablet (2 mg total) by mouth daily.    Dispense:  30 tablet    Refill:  1    Follow-up: Return in about 4 weeks (around 02/04/2021).  Blood pressure control is improving.  Will decrease dose of Cardura.  He will follow-up in 1 month.  He will continue to monitor his blood pressure.  Libby Maw, MD

## 2021-01-13 ENCOUNTER — Other Ambulatory Visit (HOSPITAL_BASED_OUTPATIENT_CLINIC_OR_DEPARTMENT_OTHER): Payer: Self-pay

## 2021-02-04 ENCOUNTER — Other Ambulatory Visit: Payer: Self-pay

## 2021-02-04 ENCOUNTER — Ambulatory Visit (INDEPENDENT_AMBULATORY_CARE_PROVIDER_SITE_OTHER): Payer: Medicare HMO | Admitting: Family Medicine

## 2021-02-04 ENCOUNTER — Encounter: Payer: Self-pay | Admitting: Family Medicine

## 2021-02-04 VITALS — BP 144/78 | HR 68 | Temp 97.2°F | Ht 69.0 in | Wt 160.0 lb

## 2021-02-04 DIAGNOSIS — I10 Essential (primary) hypertension: Secondary | ICD-10-CM

## 2021-02-04 DIAGNOSIS — R42 Dizziness and giddiness: Secondary | ICD-10-CM | POA: Diagnosis not present

## 2021-02-04 DIAGNOSIS — M545 Low back pain, unspecified: Secondary | ICD-10-CM

## 2021-02-04 DIAGNOSIS — G8929 Other chronic pain: Secondary | ICD-10-CM | POA: Diagnosis not present

## 2021-02-04 NOTE — Progress Notes (Signed)
Established Patient Office Visit  Subjective:  Patient ID: [name], male    DOB: 07/15/1940  Age: 80 y.o. MRN: [id number]  CC:  Chief Complaint  Patient presents with   Follow-up    Follow up on BP, no concerns.     HPI [name] presents for follow-up of his blood pressure status post decreasing Cardura changing over to chlorthalidone.  Blood pressure at home has been running in the 130s over 80s.  Occasional pressures into the 140s over upper 80s.  Ongoing lumbago with history of osteoarthritis of lumbar spine and is status post surgery on his lower back.  Back tends to bother him when he is up on his feet for an extended period of time walking for an extended period of time bending and stooping.  He uses Tylenol as needed.  He takes Aleve maybe 3 times weekly.  For now this is working for him.  Has tried muscle relaxers in the past but had side effects.  He has been experiencing nasal congestion with the cooler weather.  Denies postnasal drip sore throat fever or chills or cough.  Past Medical History:  Diagnosis Date   Arthritis    BPH (benign prostatic hyperplasia)    Bruises easily    Cancer (HCC)    throat   Complication of anesthesia    difficult urination after anesthesia   Dizziness    GERD (gastroesophageal reflux disease)    denies   Glaucoma    left worse than right   H/O hiatal hernia    Heart murmur    pt. reports  doctors can't hear it   Hyperlipemia    Hypertension    takes meds daily   Hypothyroidism    Transient alteration of awareness    ~ 12/27/17, evaluated by neurologist Dr. Felecia Shelling 01/06/18   Urinary retention     Past Surgical History:  Procedure Laterality Date   ANTERIOR LAT LUMBAR FUSION  03/11/2012   Procedure: ANTERIOR LATERAL LUMBAR FUSION 1 LEVEL;  Surgeon: Charlie Pitter, MD;  Location: [place] [facility];  Service: Neurosurgery;  Laterality: Left;  Left lumbar four-five extreme lumbar interbody fusion with percutaneous pedicle  screws    BACK SURGERY     ruptured disc repair   COLONOSCOPY W/ POLYPECTOMY     EYE SURGERY     left cataract   HERNIA REPAIR     INGUINAL HERNIA REPAIR Right 05/22/2014   Procedure: REPAIR RECURRENT RIGHT INGUINAL HERNIA;  Surgeon: Doreen Salvage, MD;  Location: [place];  Service: General;  Laterality: Right;   INSERTION OF MESH Right 05/22/2014   Procedure: INSERTION OF MESH;  Surgeon: Doreen Salvage, MD;  Location: [place];  Service: General;  Laterality: Right;   INSERTION OF MESH Left 08/06/2020   Procedure: INSERTION OF MESH;  Surgeon: [name], MD;  Location: [place];  Service: General;  Laterality: Left;   LUMBAR LAMINECTOMY/DECOMPRESSION MICRODISCECTOMY Left 01/11/2018   Procedure: Laminectomy and Foraminotomy - Left - Lumbar Three-Lumbar Four - Lumbar Five-Sacral One;  Surgeon: Earnie Larsson, MD;  Location: [place];  Service: Neurosurgery;  Laterality: Left;  Laminectomy and Foraminotomy - Left - Lumbar Three-Lumbar Four - Lumbar Five-Sacral One   LUMBAR PERCUTANEOUS PEDICLE SCREW 1 LEVEL  03/11/2012   Procedure: LUMBAR PERCUTANEOUS PEDICLE SCREW 1 LEVEL;  Surgeon: Charlie Pitter, MD;  Location: [place] [facility];  Service: Neurosurgery;  Laterality: Left;  Left lumbar four-five extreme lumbar interbody fusion with percutaneous pedicle screws  RADICAL NECK DISSECTION  1989   ROTATOR CUFF REPAIR     right   TONSILLECTOMY     XI ROBOTIC ASSISTED VENTRAL HERNIA N/A 08/06/2020   Procedure: XI ROBOTIC ASSISTED INCISIONAL FLANK  HERNIA REPAIR WITH MESH;  Surgeon: [name], MD;  Location: [place];  Service: General;  Laterality: N/A;    No family history on file.  Social History   Socioeconomic History   Marital status: Married    Spouse name: Not on file   Number of children: Not on file   Years of education: Not on file   Highest education level: Not on file  Occupational History   Not on file  Tobacco Use   Smoking status: Former   Smokeless tobacco: Never  Vaping Use   Vaping Use:  Never used  Substance and Sexual Activity   Alcohol use: Yes    Comment: social 1 or 2 beers a week   Drug use: No   Sexual activity: Not Currently  Other Topics Concern   Not on file  Social History Narrative   Not on file   Social Determinants of Health   Financial Resource Strain: Not on file  Food Insecurity: Not on file  Transportation Needs: Not on file  Physical Activity: Not on file  Stress: Not on file  Social Connections: Not on file  Intimate Partner Violence: Not on file    Outpatient Medications Prior to Visit  Medication Sig Dispense Refill   chlorthalidone (HYGROTON) 25 MG tablet Take 1 tablet (25 mg total) by mouth daily. 90 tablet 1   dorzolamide-timolol (COSOPT) 22.3-6.8 MG/ML ophthalmic solution Place 1 drop into both eyes 2 (two) times daily.     doxazosin (CARDURA) 2 MG tablet Take 1 tablet (2 mg total) by mouth daily. 30 tablet 1   escitalopram (LEXAPRO) 20 MG tablet Take 1 tablet (20 mg total) by mouth daily. 180 tablet 1   latanoprost (XALATAN) 0.005 % ophthalmic solution Place 1 drop into the left eye at bedtime.     levothyroxine (SYNTHROID) 75 MCG tablet Take 1 tablet (75 mcg total) by mouth daily. 90 tablet 1   loratadine (ALAVERT) 10 MG dissolvable tablet Take 10 mg by mouth daily as needed for allergies.     losartan (COZAAR) 100 MG tablet Take 1 tablet (100 mg total) by mouth daily. 90 tablet 1   naproxen sodium (ALEVE) 220 MG tablet Take 220 mg by mouth daily as needed (pain).     potassium chloride SA (KLOR-CON) 20 MEQ tablet Take 1 tablet (20 mEq total) by mouth daily. 30 tablet 3   simvastatin (ZOCOR) 20 MG tablet Take 1 tablet (20 mg total) by mouth every evening. 90 tablet 3   ipratropium (ATROVENT) 0.06 % nasal spray Place into both nostrils.     No facility-administered medications prior to visit.    Allergies  Allergen Reactions   Amlodipine Other (See Comments)    Swollen gums     Bimatoprost Other (See Comments)    Toxicity     Brimonidine Tartrate-Timolol Other (See Comments)    Toxicity   Codeine Nausea Only    ROS Review of Systems  Constitutional: Negative.  Negative for diaphoresis, fatigue, fever and unexpected weight change.  HENT:  Positive for congestion. Negative for postnasal drip, sore throat and voice change.   Eyes:  Negative for photophobia and visual disturbance.  Respiratory: Negative.  Negative for cough and wheezing.   Cardiovascular: Negative.   Gastrointestinal: Negative.  Musculoskeletal:  Positive for back pain.  Neurological:  Negative for dizziness, weakness and light-headedness.  Psychiatric/Behavioral: Negative.       Objective:    Physical Exam Vitals reviewed.  Constitutional:      General: He is not in acute distress.    Appearance: Normal appearance. He is normal weight. He is not ill-appearing, toxic-appearing or diaphoretic.  HENT:     Head: Normocephalic and atraumatic.     Right Ear: External ear normal.     Left Ear: External ear normal.  Eyes:     Extraocular Movements: Extraocular movements intact.     Conjunctiva/sclera: Conjunctivae normal.     Pupils: Pupils are equal, round, and reactive to light.  Cardiovascular:     Rate and Rhythm: Normal rate and regular rhythm.  Pulmonary:     Effort: Pulmonary effort is normal.     Breath sounds: Normal breath sounds.  Skin:    General: Skin is warm and dry.  Neurological:     Mental Status: He is alert and oriented to person, place, and time.  Psychiatric:        Mood and Affect: Mood normal.        Behavior: Behavior normal.    BP (!) 144/78 (BP Location: Left Arm, Patient Position: Sitting, Cuff Size: Normal)   Pulse 68   Temp (!) 97.2 F (36.2 C) (Temporal)   Ht 5\' 9"  (1.753 m)   Wt 160 lb (72.6 kg)   SpO2 94%   BMI 23.63 kg/m  Wt Readings from Last 3 Encounters:  02/04/21 160 lb (72.6 kg)  01/07/21 160 lb (72.6 kg)  12/06/20 160 lb 3.2 oz (72.7 kg)     Health Maintenance Due  Topic Date Due    TETANUS/TDAP  12/13/2018    There are no preventive care reminders to display for this patient.  Lab Results  Component Value Date   TSH 0.90 12/06/2020   Lab Results  Component Value Date   WBC 3.4 (L) 12/06/2020   HGB 12.5 (L) 12/06/2020   HCT 36.3 (L) 12/06/2020   MCV 95.6 12/06/2020   PLT 127.0 (L) 12/06/2020   Lab Results  Component Value Date   NA 140 12/06/2020   K 3.6 12/06/2020   CO2 31 12/06/2020   GLUCOSE 96 12/06/2020   BUN 15 12/06/2020   CREATININE 1.02 12/06/2020   BILITOT 0.8 05/13/2020   ALKPHOS 86 05/13/2020   AST 13 05/13/2020   ALT 9 05/13/2020   PROT 6.4 05/13/2020   ALBUMIN 4.5 05/13/2020   CALCIUM 9.0 12/06/2020   ANIONGAP 7 08/06/2020   GFR 69.62 12/06/2020   Lab Results  Component Value Date   CHOL 137 05/13/2020   Lab Results  Component Value Date   HDL 67 05/13/2020   Lab Results  Component Value Date   LDLCALC 54 05/13/2020   Lab Results  Component Value Date   TRIG 83 05/13/2020   Lab Results  Component Value Date   CHOLHDL 2.0 05/13/2020   No results found for: HGBA1C    Assessment & Plan:   Problem List Items Addressed This Visit       Cardiovascular and Mediastinum   Essential hypertension - Primary     Other   Lightheadedness   Chronic midline low back pain without sciatica    No orders of the defined types were placed in this encounter.   Follow-up: Return in about 3 months (around 05/07/2021).  I measured his blood pressure at  122/82.  Continue current medication therapy for htn. .  We discussed the possibility of using Pamelor at night to help with his chronic lower back pain.  For now he feels as though the as needed Tylenol and Aleve are working for him.  For his congestion I suggested a nasal steroid such as Flonase or Aleve.  Libby Maw, MD

## 2021-02-11 ENCOUNTER — Ambulatory Visit: Payer: Medicare HMO | Attending: Internal Medicine

## 2021-02-11 DIAGNOSIS — Z23 Encounter for immunization: Secondary | ICD-10-CM

## 2021-02-11 NOTE — Progress Notes (Signed)
   Covid-19 Vaccination [facility]  Name:  [name]    MRN: [id number] DOB: 1940-08-30  02/11/2021  Mr. [name] was observed post Covid-19 immunization for 15 minutes without incident. He was provided with Vaccine Information Sheet and instruction to access the V-Safe system.   Mr. [name] was instructed to call 911 with any severe reactions post vaccine: Difficulty breathing  Swelling of face and throat  A fast heartbeat  A bad rash all over body  Dizziness and weakness

## 2021-02-21 ENCOUNTER — Other Ambulatory Visit (HOSPITAL_BASED_OUTPATIENT_CLINIC_OR_DEPARTMENT_OTHER): Payer: Self-pay

## 2021-02-26 ENCOUNTER — Other Ambulatory Visit: Payer: Self-pay | Admitting: Family Medicine

## 2021-02-26 DIAGNOSIS — E039 Hypothyroidism, unspecified: Secondary | ICD-10-CM

## 2021-02-28 DIAGNOSIS — H52223 Regular astigmatism, bilateral: Secondary | ICD-10-CM | POA: Diagnosis not present

## 2021-02-28 DIAGNOSIS — H43813 Vitreous degeneration, bilateral: Secondary | ICD-10-CM | POA: Diagnosis not present

## 2021-02-28 DIAGNOSIS — H5213 Myopia, bilateral: Secondary | ICD-10-CM | POA: Diagnosis not present

## 2021-02-28 DIAGNOSIS — Z961 Presence of intraocular lens: Secondary | ICD-10-CM | POA: Diagnosis not present

## 2021-02-28 DIAGNOSIS — H04123 Dry eye syndrome of bilateral lacrimal glands: Secondary | ICD-10-CM | POA: Diagnosis not present

## 2021-02-28 DIAGNOSIS — H26492 Other secondary cataract, left eye: Secondary | ICD-10-CM | POA: Diagnosis not present

## 2021-02-28 DIAGNOSIS — H401132 Primary open-angle glaucoma, bilateral, moderate stage: Secondary | ICD-10-CM | POA: Diagnosis not present

## 2021-03-10 ENCOUNTER — Other Ambulatory Visit: Payer: Self-pay | Admitting: Family Medicine

## 2021-03-10 DIAGNOSIS — I10 Essential (primary) hypertension: Secondary | ICD-10-CM

## 2021-05-01 DIAGNOSIS — Z85828 Personal history of other malignant neoplasm of skin: Secondary | ICD-10-CM | POA: Diagnosis not present

## 2021-05-01 DIAGNOSIS — L820 Inflamed seborrheic keratosis: Secondary | ICD-10-CM | POA: Diagnosis not present

## 2021-05-01 DIAGNOSIS — C44329 Squamous cell carcinoma of skin of other parts of face: Secondary | ICD-10-CM | POA: Diagnosis not present

## 2021-05-01 DIAGNOSIS — L570 Actinic keratosis: Secondary | ICD-10-CM | POA: Diagnosis not present

## 2021-05-01 DIAGNOSIS — L119 Acantholytic disorder, unspecified: Secondary | ICD-10-CM | POA: Diagnosis not present

## 2021-05-01 DIAGNOSIS — D1801 Hemangioma of skin and subcutaneous tissue: Secondary | ICD-10-CM | POA: Diagnosis not present

## 2021-05-01 DIAGNOSIS — D485 Neoplasm of uncertain behavior of skin: Secondary | ICD-10-CM | POA: Diagnosis not present

## 2021-05-01 DIAGNOSIS — D0439 Carcinoma in situ of skin of other parts of face: Secondary | ICD-10-CM | POA: Diagnosis not present

## 2021-05-01 DIAGNOSIS — C4440 Unspecified malignant neoplasm of skin of scalp and neck: Secondary | ICD-10-CM | POA: Diagnosis not present

## 2021-05-01 DIAGNOSIS — D492 Neoplasm of unspecified behavior of bone, soft tissue, and skin: Secondary | ICD-10-CM | POA: Diagnosis not present

## 2021-05-01 DIAGNOSIS — L821 Other seborrheic keratosis: Secondary | ICD-10-CM | POA: Diagnosis not present

## 2021-05-05 DIAGNOSIS — I10 Essential (primary) hypertension: Secondary | ICD-10-CM | POA: Diagnosis not present

## 2021-05-05 DIAGNOSIS — N529 Male erectile dysfunction, unspecified: Secondary | ICD-10-CM | POA: Diagnosis not present

## 2021-05-05 DIAGNOSIS — F329 Major depressive disorder, single episode, unspecified: Secondary | ICD-10-CM | POA: Diagnosis not present

## 2021-05-05 DIAGNOSIS — Z008 Encounter for other general examination: Secondary | ICD-10-CM | POA: Diagnosis not present

## 2021-05-05 DIAGNOSIS — Z720 Tobacco use: Secondary | ICD-10-CM | POA: Diagnosis not present

## 2021-05-05 DIAGNOSIS — Z8521 Personal history of malignant neoplasm of larynx: Secondary | ICD-10-CM | POA: Diagnosis not present

## 2021-05-05 DIAGNOSIS — E785 Hyperlipidemia, unspecified: Secondary | ICD-10-CM | POA: Diagnosis not present

## 2021-05-05 DIAGNOSIS — Z8249 Family history of ischemic heart disease and other diseases of the circulatory system: Secondary | ICD-10-CM | POA: Diagnosis not present

## 2021-05-05 DIAGNOSIS — Z85828 Personal history of other malignant neoplasm of skin: Secondary | ICD-10-CM | POA: Diagnosis not present

## 2021-05-05 DIAGNOSIS — M1990 Unspecified osteoarthritis, unspecified site: Secondary | ICD-10-CM | POA: Diagnosis not present

## 2021-05-05 DIAGNOSIS — H409 Unspecified glaucoma: Secondary | ICD-10-CM | POA: Diagnosis not present

## 2021-05-05 DIAGNOSIS — J302 Other seasonal allergic rhinitis: Secondary | ICD-10-CM | POA: Diagnosis not present

## 2021-05-05 DIAGNOSIS — R69 Illness, unspecified: Secondary | ICD-10-CM | POA: Diagnosis not present

## 2021-05-05 DIAGNOSIS — F411 Generalized anxiety disorder: Secondary | ICD-10-CM | POA: Diagnosis not present

## 2021-05-07 ENCOUNTER — Encounter: Payer: Self-pay | Admitting: Family Medicine

## 2021-05-07 ENCOUNTER — Other Ambulatory Visit: Payer: Self-pay

## 2021-05-07 ENCOUNTER — Ambulatory Visit (INDEPENDENT_AMBULATORY_CARE_PROVIDER_SITE_OTHER): Payer: Medicare HMO | Admitting: Family Medicine

## 2021-05-07 VITALS — BP 136/80 | HR 73 | Temp 97.1°F | Ht 69.0 in | Wt 160.0 lb

## 2021-05-07 DIAGNOSIS — I10 Essential (primary) hypertension: Secondary | ICD-10-CM

## 2021-05-07 DIAGNOSIS — G8929 Other chronic pain: Secondary | ICD-10-CM | POA: Diagnosis not present

## 2021-05-07 DIAGNOSIS — R1314 Dysphagia, pharyngoesophageal phase: Secondary | ICD-10-CM | POA: Diagnosis not present

## 2021-05-07 DIAGNOSIS — M545 Low back pain, unspecified: Secondary | ICD-10-CM

## 2021-05-07 DIAGNOSIS — T887XXA Unspecified adverse effect of drug or medicament, initial encounter: Secondary | ICD-10-CM | POA: Diagnosis not present

## 2021-05-07 DIAGNOSIS — R42 Dizziness and giddiness: Secondary | ICD-10-CM | POA: Diagnosis not present

## 2021-05-07 NOTE — Progress Notes (Signed)
Established Patient Office Visit  Subjective:  Patient ID: [name], male    DOB: Jun 13, 1940  Age: 81 y.o. MRN: [id number]  CC:  Chief Complaint  Patient presents with   Follow-up    3 month follow up, discuss medication patient stopped taking Chlorthalidone 1 month ago due to side effects.     HPI [name] presents for follow-up of hypertension after the addition of chlorthalidone and potassium.  Developed lightheadedness while taking chlorthalidone.  Patient discontinued it along with the potassium that he had difficulty swallowing.  Blood pressure has been since running in the 130 range over 70 range.  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  May need a PEG tube in the future.  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  Past Medical History:  Diagnosis Date   Arthritis    BPH (benign prostatic hyperplasia)    Bruises easily    Cancer (HCC)    throat   Complication of anesthesia    difficult urination after anesthesia   Dizziness    GERD (gastroesophageal reflux disease)    denies   Glaucoma    left worse than right   H/O hiatal hernia    Heart murmur    pt. reports  doctors can't hear it   Hyperlipemia    Hypertension    takes meds daily   Hypothyroidism    Transient alteration of awareness    ~ 12/27/17, evaluated by neurologist Dr. Felecia Shelling 01/06/18   Urinary retention     Past Surgical History:  Procedure Laterality Date   ANTERIOR LAT LUMBAR FUSION  03/11/2012   Procedure: ANTERIOR LATERAL LUMBAR FUSION 1 LEVEL;  Surgeon: Charlie Pitter, MD;  Location: [place];  Service: Neurosurgery;  Laterality: Left;  Left lumbar four-five extreme lumbar interbody fusion with percutaneous pedicle screws    BACK SURGERY     ruptured disc repair   COLONOSCOPY W/ POLYPECTOMY     EYE SURGERY     left cataract   HERNIA  REPAIR     INGUINAL HERNIA REPAIR Right 05/22/2014   Procedure: REPAIR RECURRENT RIGHT INGUINAL HERNIA;  Surgeon: Doreen Salvage, MD;  Location: [place];  Service: General;  Laterality: Right;   INSERTION OF MESH Right 05/22/2014   Procedure: INSERTION OF MESH;  Surgeon: Doreen Salvage, MD;  Location: [place];  Service: General;  Laterality: Right;   INSERTION OF MESH Left 08/06/2020   Procedure: INSERTION OF MESH;  Surgeon: [name], MD;  Location: [place];  Service: General;  Laterality: Left;   LUMBAR LAMINECTOMY/DECOMPRESSION MICRODISCECTOMY Left 01/11/2018   Procedure: Laminectomy and Foraminotomy - Left - Lumbar Three-Lumbar Four - Lumbar Five-Sacral One;  Surgeon: Earnie Larsson, MD;  Location: [place];  Service: Neurosurgery;  Laterality: Left;  Laminectomy and Foraminotomy - Left - Lumbar Three-Lumbar Four - Lumbar Five-Sacral One   LUMBAR PERCUTANEOUS PEDICLE SCREW 1 LEVEL  03/11/2012   Procedure: LUMBAR PERCUTANEOUS PEDICLE SCREW 1 LEVEL;  Surgeon: Charlie Pitter, MD;  Location: [facility] [facility];  Service: Neurosurgery;  Laterality: Left;  Left lumbar four-five extreme lumbar interbody fusion with percutaneous pedicle screws    RADICAL NECK DISSECTION  1989   ROTATOR CUFF REPAIR     right   TONSILLECTOMY     XI ROBOTIC ASSISTED VENTRAL HERNIA N/A 08/06/2020   Procedure: XI ROBOTIC ASSISTED INCISIONAL  FLANK  HERNIA REPAIR WITH MESH;  Surgeon: [name], MD;  Location: [place];  Service: General;  Laterality: N/A;    History reviewed. No pertinent family history.  Social History   Socioeconomic History   Marital status: Married    Spouse name: Not on file   Number of children: Not on file   Years of education: Not on file   Highest education level: Not on file  Occupational History   Not on file  Tobacco Use   Smoking status: Former   Smokeless tobacco: Never  Vaping Use   Vaping Use: Never used  Substance and Sexual Activity   Alcohol use: Yes    Comment: social 1 or 2 beers a week   Drug  use: No   Sexual activity: Not Currently  Other Topics Concern   Not on file  Social History Narrative   Not on file   Social Determinants of Health   Financial Resource Strain: Not on file  Food Insecurity: Not on file  Transportation Needs: Not on file  Physical Activity: Not on file  Stress: Not on file  Social Connections: Not on file  Intimate Partner Violence: Not on file    Outpatient Medications Prior to Visit  Medication Sig Dispense Refill   dorzolamide-timolol (COSOPT) 22.3-6.8 MG/ML ophthalmic solution Place 1 drop into both eyes 2 (two) times daily.     doxazosin (CARDURA) 2 MG tablet TAKE ONE TABLET BY MOUTH DAILY 30 tablet 1   escitalopram (LEXAPRO) 20 MG tablet Take 1 tablet (20 mg total) by mouth daily. 180 tablet 1   ipratropium (ATROVENT) 0.06 % nasal spray Place into both nostrils.     latanoprost (XALATAN) 0.005 % ophthalmic solution Place 1 drop into the left eye at bedtime.     levothyroxine (SYNTHROID) 75 MCG tablet TAKE ONE TABLET BY MOUTH DAILY 90 tablet 1   loratadine (ALAVERT) 10 MG dissolvable tablet Take 10 mg by mouth daily as needed for allergies.     losartan (COZAAR) 100 MG tablet Take 1 tablet (100 mg total) by mouth daily. 90 tablet 1   naproxen sodium (ALEVE) 220 MG tablet Take 220 mg by mouth daily as needed (pain).     simvastatin (ZOCOR) 20 MG tablet Take 1 tablet (20 mg total) by mouth every evening. 90 tablet 3   potassium chloride SA (KLOR-CON) 20 MEQ tablet Take 1 tablet (20 mEq total) by mouth daily. 30 tablet 3   chlorthalidone (HYGROTON) 25 MG tablet Take 1 tablet (25 mg total) by mouth daily. (Patient not taking: Reported on 05/07/2021) 90 tablet 1   No facility-administered medications prior to visit.    Allergies  Allergen Reactions   Amlodipine Other (See Comments)    Swollen gums     Bimatoprost Other (See Comments)    Toxicity    Brimonidine Tartrate-Timolol Other (See Comments)    Toxicity   Codeine Nausea Only     ROS Review of Systems  Constitutional:  Negative for chills, diaphoresis, fatigue, fever and unexpected weight change.  HENT: Negative.    Eyes:  Negative for photophobia and visual disturbance.  Respiratory: Negative.    Cardiovascular: Negative.   Gastrointestinal: Negative.  Negative for nausea and vomiting.  Endocrine: Negative for polyphagia and polyuria.  Genitourinary: Negative.   Musculoskeletal:  Positive for back pain.  Neurological:  Negative for dizziness, light-headedness and headaches.     Objective:    Physical Exam Vitals and nursing note reviewed.  Constitutional:  General: He is not in acute distress.    Appearance: Normal appearance. He is not ill-appearing, toxic-appearing or diaphoretic.  HENT:     Head: Normocephalic and atraumatic.     Right Ear: External ear normal.     Left Ear: External ear normal.     Mouth/Throat:     Mouth: Mucous membranes are moist.     Pharynx: Oropharynx is clear. No oropharyngeal exudate or posterior oropharyngeal erythema.  Eyes:     General:        Right eye: No discharge.        Left eye: No discharge.     Extraocular Movements: Extraocular movements intact.     Conjunctiva/sclera: Conjunctivae normal.     Pupils: Pupils are equal, round, and reactive to light.  Neck:     Vascular: No carotid bruit.   Cardiovascular:     Rate and Rhythm: Normal rate and regular rhythm.  Pulmonary:     Effort: Pulmonary effort is normal.     Breath sounds: Normal breath sounds.  Musculoskeletal:     Cervical back: No rigidity or tenderness.  Lymphadenopathy:     Cervical: No cervical adenopathy.  Skin:    General: Skin is warm and dry.  Neurological:     General: No focal deficit present.     Mental Status: He is alert and oriented to person, place, and time.  Psychiatric:        Mood and Affect: Mood normal.        Behavior: Behavior normal.    BP 136/80 (BP Location: Left Arm, Patient Position: Sitting, Cuff  Size: Normal)    Pulse 73    Temp (!) 97.1 F (36.2 C) (Temporal)    Ht 5\' 9"  (1.753 m)    Wt 160 lb (72.6 kg)    SpO2 97%    BMI 23.63 kg/m  Wt Readings from Last 3 Encounters:  05/07/21 160 lb (72.6 kg)  02/04/21 160 lb (72.6 kg)  01/07/21 160 lb (72.6 kg)     Health Maintenance Due  Topic Date Due   TETANUS/TDAP  12/13/2018    There are no preventive care reminders to display for this patient.  Lab Results  Component Value Date   TSH 0.90 12/06/2020   Lab Results  Component Value Date   WBC 3.4 (L) 12/06/2020   HGB 12.5 (L) 12/06/2020   HCT 36.3 (L) 12/06/2020   MCV 95.6 12/06/2020   PLT 127.0 (L) 12/06/2020   Lab Results  Component Value Date   NA 140 12/06/2020   K 3.6 12/06/2020   CO2 31 12/06/2020   GLUCOSE 96 12/06/2020   BUN 15 12/06/2020   CREATININE 1.02 12/06/2020   BILITOT 0.8 05/13/2020   ALKPHOS 86 05/13/2020   AST 13 05/13/2020   ALT 9 05/13/2020   PROT 6.4 05/13/2020   ALBUMIN 4.5 05/13/2020   CALCIUM 9.0 12/06/2020   ANIONGAP 7 08/06/2020   GFR 69.62 12/06/2020   Lab Results  Component Value Date   CHOL 137 05/13/2020   Lab Results  Component Value Date   HDL 67 05/13/2020   Lab Results  Component Value Date   LDLCALC 54 05/13/2020   Lab Results  Component Value Date   TRIG 83 05/13/2020   Lab Results  Component Value Date   CHOLHDL 2.0 05/13/2020   No results found for: HGBA1C    Assessment & Plan:   Problem List Items Addressed This Visit  Cardiovascular and Mediastinum   Essential hypertension - Primary     Other   Lightheadedness   Chronic midline low back pain without sciatica   Other Visit Diagnoses     Medication side effect           No orders of the defined types were placed in this encounter.   Follow-up: Return in about 3 months (around 08/05/2021).  Have discontinued chlorthalidone and potassium supplementation.  Blood pressure is controlled with losartan alone.  Information was given on  managing hypertension and low back pain.  He will continue Tylenol and Advil as needed.  Libby Maw, MD

## 2021-05-22 ENCOUNTER — Telehealth: Payer: Self-pay | Admitting: Family Medicine

## 2021-05-22 DIAGNOSIS — I10 Essential (primary) hypertension: Secondary | ICD-10-CM

## 2021-05-22 DIAGNOSIS — E039 Hypothyroidism, unspecified: Secondary | ICD-10-CM

## 2021-05-22 NOTE — Telephone Encounter (Signed)
Pt is wanting all of his scripts to now be a 100 day supply. He got a form from his insurance company. He stated he has refills coming up soon. I have placed it in Dr. Bebe Shaggy folder.

## 2021-05-23 MED ORDER — LOSARTAN POTASSIUM 100 MG PO TABS: 100.0000 mg | ORAL_TABLET | Freq: Every day | ORAL | 0 refills | Status: DC

## 2021-05-23 MED ORDER — DOXAZOSIN MESYLATE 2 MG PO TABS: 2.0000 mg | ORAL_TABLET | Freq: Every day | ORAL | 0 refills | Status: DC

## 2021-05-23 MED ORDER — LEVOTHYROXINE SODIUM 75 MCG PO TABS: 75.0000 ug | ORAL_TABLET | Freq: Every day | ORAL | 0 refills | Status: DC

## 2021-05-23 NOTE — Telephone Encounter (Signed)
Refills sent in, patient aware.  

## 2021-06-02 ENCOUNTER — Other Ambulatory Visit: Payer: Self-pay | Admitting: Family Medicine

## 2021-06-02 DIAGNOSIS — E785 Hyperlipidemia, unspecified: Secondary | ICD-10-CM

## 2021-06-06 ENCOUNTER — Other Ambulatory Visit: Payer: Self-pay

## 2021-06-06 DIAGNOSIS — E785 Hyperlipidemia, unspecified: Secondary | ICD-10-CM

## 2021-06-06 MED ORDER — SIMVASTATIN 20 MG PO TABS: 20.0000 mg | ORAL_TABLET | Freq: Every evening | ORAL | 3 refills | Status: DC

## 2021-06-10 DIAGNOSIS — H524 Presbyopia: Secondary | ICD-10-CM | POA: Diagnosis not present

## 2021-06-10 DIAGNOSIS — H52223 Regular astigmatism, bilateral: Secondary | ICD-10-CM | POA: Diagnosis not present

## 2021-06-11 DIAGNOSIS — D219 Benign neoplasm of connective and other soft tissue, unspecified: Secondary | ICD-10-CM | POA: Diagnosis not present

## 2021-06-11 DIAGNOSIS — L821 Other seborrheic keratosis: Secondary | ICD-10-CM | POA: Diagnosis not present

## 2021-06-11 DIAGNOSIS — C4442 Squamous cell carcinoma of skin of scalp and neck: Secondary | ICD-10-CM | POA: Diagnosis not present

## 2021-06-20 DIAGNOSIS — R490 Dysphonia: Secondary | ICD-10-CM | POA: Diagnosis not present

## 2021-06-20 DIAGNOSIS — Z85819 Personal history of malignant neoplasm of unspecified site of lip, oral cavity, and pharynx: Secondary | ICD-10-CM | POA: Diagnosis not present

## 2021-06-20 DIAGNOSIS — R1314 Dysphagia, pharyngoesophageal phase: Secondary | ICD-10-CM | POA: Diagnosis not present

## 2021-06-24 ENCOUNTER — Other Ambulatory Visit: Payer: Self-pay | Admitting: Family Medicine

## 2021-06-24 DIAGNOSIS — F411 Generalized anxiety disorder: Secondary | ICD-10-CM

## 2021-06-24 MED ORDER — ESCITALOPRAM OXALATE 20 MG PO TABS: 20.0000 mg | ORAL_TABLET | Freq: Every day | ORAL | 0 refills | Status: DC

## 2021-06-24 NOTE — Telephone Encounter (Signed)
Caller Name: Cassey Hurrell Call back phone #: [phone]  MEDICATION(S): Lexapro    Has the patient contacted their pharmacy?   ___ YES           ___ NO IF YES, when and what did the pharmacy advise? Dr Ethelene Hal is his new PCP, his name should be on the Rx.   Requesting 23mo supply, ins covers that for the same price as 30 days.  Preferred Pharmacy: [facility]  ~~~Please advise patient/caregiver to allow 2-3 business days to process RX refills.

## 2021-06-24 NOTE — Telephone Encounter (Signed)
Refill sent in

## 2021-06-24 NOTE — Telephone Encounter (Signed)
Refill on pending Rx last refill 05/15/20 last OV 05/07/21. Please advise

## 2021-08-04 DIAGNOSIS — Z85828 Personal history of other malignant neoplasm of skin: Secondary | ICD-10-CM | POA: Diagnosis not present

## 2021-08-04 DIAGNOSIS — L821 Other seborrheic keratosis: Secondary | ICD-10-CM | POA: Diagnosis not present

## 2021-08-04 DIAGNOSIS — D0439 Carcinoma in situ of skin of other parts of face: Secondary | ICD-10-CM | POA: Diagnosis not present

## 2021-08-04 DIAGNOSIS — L820 Inflamed seborrheic keratosis: Secondary | ICD-10-CM | POA: Diagnosis not present

## 2021-08-04 DIAGNOSIS — D225 Melanocytic nevi of trunk: Secondary | ICD-10-CM | POA: Diagnosis not present

## 2021-08-04 DIAGNOSIS — L570 Actinic keratosis: Secondary | ICD-10-CM | POA: Diagnosis not present

## 2021-08-04 DIAGNOSIS — D485 Neoplasm of uncertain behavior of skin: Secondary | ICD-10-CM | POA: Diagnosis not present

## 2021-08-05 ENCOUNTER — Ambulatory Visit (INDEPENDENT_AMBULATORY_CARE_PROVIDER_SITE_OTHER): Payer: Medicare HMO | Admitting: Family Medicine

## 2021-08-05 ENCOUNTER — Encounter: Payer: Self-pay | Admitting: Family Medicine

## 2021-08-05 VITALS — BP 138/78 | HR 65 | Temp 97.9°F | Ht 69.0 in | Wt 155.8 lb

## 2021-08-05 DIAGNOSIS — E785 Hyperlipidemia, unspecified: Secondary | ICD-10-CM | POA: Diagnosis not present

## 2021-08-05 DIAGNOSIS — R634 Abnormal weight loss: Secondary | ICD-10-CM

## 2021-08-05 DIAGNOSIS — F411 Generalized anxiety disorder: Secondary | ICD-10-CM | POA: Diagnosis not present

## 2021-08-05 DIAGNOSIS — M545 Low back pain, unspecified: Secondary | ICD-10-CM

## 2021-08-05 DIAGNOSIS — G8929 Other chronic pain: Secondary | ICD-10-CM

## 2021-08-05 DIAGNOSIS — D539 Nutritional anemia, unspecified: Secondary | ICD-10-CM | POA: Diagnosis not present

## 2021-08-05 DIAGNOSIS — E039 Hypothyroidism, unspecified: Secondary | ICD-10-CM

## 2021-08-05 DIAGNOSIS — Z85810 Personal history of malignant neoplasm of tongue: Secondary | ICD-10-CM | POA: Diagnosis not present

## 2021-08-05 DIAGNOSIS — R1314 Dysphagia, pharyngoesophageal phase: Secondary | ICD-10-CM | POA: Diagnosis not present

## 2021-08-05 DIAGNOSIS — I10 Essential (primary) hypertension: Secondary | ICD-10-CM

## 2021-08-05 DIAGNOSIS — R69 Illness, unspecified: Secondary | ICD-10-CM | POA: Diagnosis not present

## 2021-08-05 HISTORY — DX: Generalized anxiety disorder: F41.1

## 2021-08-05 LAB — URINALYSIS, ROUTINE W REFLEX MICROSCOPIC
Bilirubin Urine: NEGATIVE
Hgb urine dipstick: NEGATIVE
Ketones, ur: NEGATIVE
Leukocytes,Ua: NEGATIVE
Nitrite: NEGATIVE
RBC / HPF: NONE SEEN (ref 0–?)
Specific Gravity, Urine: 1.01 (ref 1.000–1.030)
Total Protein, Urine: NEGATIVE
Urine Glucose: NEGATIVE
Urobilinogen, UA: 1 (ref 0.0–1.0)
pH: 6.5 (ref 5.0–8.0)

## 2021-08-05 LAB — LIPID PANEL
Cholesterol: 111 mg/dL (ref 0–200)
HDL: 59.7 mg/dL (ref >39.00)
LDL Cholesterol: 41 mg/dL (ref 0–99)
NonHDL: 51.58
Total CHOL/HDL Ratio: 2
Triglycerides: 54 mg/dL (ref 0.0–149.0)
VLDL: 10.8 mg/dL (ref 0.0–40.0)

## 2021-08-05 LAB — CBC WITH DIFFERENTIAL/PLATELET
Basophils Absolute: 0.1 10*3/uL (ref 0.0–0.1)
Basophils Relative: 1.9 % (ref 0.0–3.0)
Eosinophils Absolute: 0.1 10*3/uL (ref 0.0–0.7)
Eosinophils Relative: 1.6 % (ref 0.0–5.0)
HCT: 36.6 % — ABNORMAL LOW (ref 39.0–52.0)
Hemoglobin: 12.6 g/dL — ABNORMAL LOW (ref 13.0–17.0)
Lymphocytes Relative: 24.3 % (ref 12.0–46.0)
Lymphs Abs: 0.8 10*3/uL (ref 0.7–4.0)
MCHC: 34.5 g/dL (ref 30.0–36.0)
MCV: 96.3 fl (ref 78.0–100.0)
Monocytes Absolute: 0.3 10*3/uL (ref 0.1–1.0)
Monocytes Relative: 9.2 % (ref 3.0–12.0)
Neutro Abs: 2.2 10*3/uL (ref 1.4–7.7)
Neutrophils Relative %: 63 % (ref 43.0–77.0)
Platelets: 124 10*3/uL — ABNORMAL LOW (ref 150.0–400.0)
RBC: 3.8 Mil/uL — ABNORMAL LOW (ref 4.22–5.81)
RDW: 13.4 % (ref 11.5–15.5)
WBC: 3.4 10*3/uL — ABNORMAL LOW (ref 4.0–10.5)

## 2021-08-05 LAB — BASIC METABOLIC PANEL
BUN: 16 mg/dL (ref 6–23)
CO2: 32 mEq/L (ref 19–32)
Calcium: 9.5 mg/dL (ref 8.4–10.5)
Chloride: 105 mEq/L (ref 96–112)
Creatinine, Ser: 1.01 mg/dL (ref 0.40–1.50)
GFR: 70.12 mL/min (ref >60.00)
Glucose, Bld: 72 mg/dL (ref 70–99)
Potassium: 4 mEq/L (ref 3.5–5.1)
Sodium: 141 mEq/L (ref 135–145)

## 2021-08-05 LAB — HEPATIC FUNCTION PANEL
ALT: 8 U/L (ref 0–53)
AST: 15 U/L (ref 0–37)
Albumin: 4.2 g/dL (ref 3.5–5.2)
Alkaline Phosphatase: 64 U/L (ref 39–117)
Bilirubin, Direct: 0.2 mg/dL (ref 0.0–0.3)
Total Bilirubin: 0.9 mg/dL (ref 0.2–1.2)
Total Protein: 6.3 g/dL (ref 6.0–8.3)

## 2021-08-05 LAB — B12 AND FOLATE PANEL
Folate: 9.1 ng/mL (ref >5.9)
Vitamin B-12: 368 pg/mL (ref 211–911)

## 2021-08-05 LAB — TSH: TSH: 1.14 u[IU]/mL (ref 0.35–5.50)

## 2021-08-05 NOTE — Progress Notes (Signed)
? ?Established Patient Office Visit ? ?Subjective:  ?Patient ID: [name], male    DOB: 04/30/1941  Age: 81 y.o. MRN: [id number] ? ?CC:  ?Chief Complaint  ?Patient presents with  ?? Follow-up  ?  3 month follow up on thyroid, concerns about weight loss. Not fasting.   ? ? ?HPI ?[name] presents for follow-up of hypertension, hypothyroidism, mixed hyperlipidemia, anxiety, pharyngeal esophageal dysphagia and weight loss.  Becoming more more difficult for him to chew and swallow his foods without experiencing dysphagia and/or choking.  He has to be careful.  He is supplementing with Ensure up to twice daily.  He feels well.  Appetite is normal.  Denies headaches, URI symptoms, reflux, abdominal pain, constipation, Peezy or melena.  Urine flow is excellent frequency urgency hematuria.  Seeing dermatology every 4 months for skin surveillance.  Quit smoking 35 years ago.  Ongoing history of mild back pain status post history of 3 surgeries.  Pain is relieved with Tylenol and an occasional Aleve. ? ?Past Medical History:  ?Diagnosis Date  ?? Anxiety state 08/05/2021  ?? Arthritis   ?? BPH (benign prostatic hyperplasia)   ?? Bruises easily   ?? Cancer [facility])   ? throat  ?? Complication of anesthesia   ? difficult urination after anesthesia  ?? Dizziness   ?? GERD (gastroesophageal reflux disease)   ? denies  ?? Glaucoma   ? left worse than right  ?? H/O hiatal hernia   ?? Heart murmur   ? pt. reports  doctors can't hear it  ?? Hyperlipemia   ?? Hypertension   ? takes meds daily  ?? Hypothyroidism   ?? Transient alteration of awareness   ? ~ 12/27/17, evaluated by neurologist Dr. Felecia Shelling 01/06/18  ?? Urinary retention   ? ? ?Past Surgical History:  ?Procedure Laterality Date  ?? ANTERIOR LAT LUMBAR FUSION  03/11/2012  ? Procedure: ANTERIOR LATERAL LUMBAR FUSION 1 LEVEL;  Surgeon: Charlie Pitter, MD;  Location: [place] [facility];  Service: Neurosurgery;  Laterality: Left;  Left lumbar four-five extreme lumbar interbody  fusion with percutaneous pedicle screws ?  ?? BACK SURGERY    ? ruptured disc repair  ?? COLONOSCOPY W/ POLYPECTOMY    ?? EYE SURGERY    ? left cataract  ?? HERNIA REPAIR    ?? INGUINAL HERNIA REPAIR Right 05/22/2014  ? Procedure: REPAIR RECURRENT RIGHT INGUINAL HERNIA;  Surgeon: Doreen Salvage, MD;  Location: [place];  Service: General;  Laterality: Right;  ?? INSERTION OF MESH Right 05/22/2014  ? Procedure: INSERTION OF MESH;  Surgeon: Doreen Salvage, MD;  Location: [place];  Service: General;  Laterality: Right;  ?? INSERTION OF MESH Left 08/06/2020  ? Procedure: INSERTION OF MESH;  Surgeon: [name], MD;  Location: [place];  Service: General;  Laterality: Left;  ?? LUMBAR LAMINECTOMY/DECOMPRESSION MICRODISCECTOMY Left 01/11/2018  ? Procedure: Laminectomy and Foraminotomy - Left - Lumbar Three-Lumbar Four - Lumbar Five-Sacral One;  Surgeon: Earnie Larsson, MD;  Location: [place];  Service: Neurosurgery;  Laterality: Left;  Laminectomy and Foraminotomy - Left - Lumbar Three-Lumbar Four - Lumbar Five-Sacral One  ?? LUMBAR PERCUTANEOUS PEDICLE SCREW 1 LEVEL  03/11/2012  ? Procedure: LUMBAR PERCUTANEOUS PEDICLE SCREW 1 LEVEL;  Surgeon: Charlie Pitter, MD;  Location: [place] [facility];  Service: Neurosurgery;  Laterality: Left;  Left lumbar four-five extreme lumbar interbody fusion with percutaneous pedicle screws ?  ?? RADICAL NECK DISSECTION  1989  ?? ROTATOR CUFF REPAIR    ?  right  ?? TONSILLECTOMY    ?? XI ROBOTIC ASSISTED VENTRAL HERNIA N/A 08/06/2020  ? Procedure: XI ROBOTIC ASSISTED INCISIONAL FLANK  HERNIA REPAIR WITH MESH;  Surgeon: [name], MD;  Location: [place];  Service: General;  Laterality: N/A;  ? ? ?History reviewed. No pertinent family history. ? ?Social History  ? ?Socioeconomic History  ?? Marital status: Married  ?  Spouse name: Not on file  ?? Number of children: Not on file  ?? Years of education: Not on file  ?? Highest education level: Not on file  ?Occupational History  ?? Not on file  ?Tobacco Use  ?? Smoking  status: Former  ?? Smokeless tobacco: Never  ?Vaping Use  ?? Vaping Use: Never used  ?Substance and Sexual Activity  ?? Alcohol use: Yes  ?  Comment: social 1 or 2 beers a week  ?? Drug use: No  ?? Sexual activity: Not Currently  ?Other Topics Concern  ?? Not on file  ?Social History Narrative  ?? Not on file  ? ?Social Determinants of Health  ? ?Financial Resource Strain: Not on file  ?Food Insecurity: Not on file  ?Transportation Needs: Not on file  ?Physical Activity: Not on file  ?Stress: Not on file  ?Social Connections: Not on file  ?Intimate Partner Violence: Not on file  ? ? ?Outpatient Medications Prior to Visit  ?Medication Sig Dispense Refill  ?? dorzolamide-timolol (COSOPT) 22.3-6.8 MG/ML ophthalmic solution Place 1 drop into both eyes 2 (two) times daily.    ?? doxazosin (CARDURA) 2 MG tablet Take 1 tablet (2 mg total) by mouth daily. 100 tablet 0  ?? escitalopram (LEXAPRO) 20 MG tablet Take 1 tablet (20 mg total) by mouth daily. 90 tablet 0  ?? ipratropium (ATROVENT) 0.06 % nasal spray Place into both nostrils.    ?? latanoprost (XALATAN) 0.005 % ophthalmic solution Place 1 drop into the left eye at bedtime.    ?? levothyroxine (SYNTHROID) 75 MCG tablet Take 1 tablet (75 mcg total) by mouth daily. 100 tablet 0  ?? loratadine (ALAVERT) 10 MG dissolvable tablet Take 10 mg by mouth daily as needed for allergies.    ?? losartan (COZAAR) 100 MG tablet Take 1 tablet (100 mg total) by mouth daily. 100 tablet 0  ?? naproxen sodium (ALEVE) 220 MG tablet Take 220 mg by mouth daily as needed (pain).    ?? simvastatin (ZOCOR) 20 MG tablet Take 1 tablet (20 mg total) by mouth every evening. 90 tablet 3  ? ?No facility-administered medications prior to visit.  ? ? ?Allergies  ?Allergen Reactions  ?? Amlodipine Other (See Comments)  ?  Swollen gums ? ?  ?? Bimatoprost Other (See Comments)  ?  Toxicity ?  ?? Brimonidine Tartrate-Timolol Other (See Comments)  ?  Toxicity  ?? Codeine Nausea Only  ? ? ?ROS ?Review of  Systems  ?Constitutional:  Positive for unexpected weight change. Negative for chills, diaphoresis, fatigue and fever.  ?HENT: Negative.    ?Eyes:  Negative for photophobia and visual disturbance.  ?Respiratory:  Negative for cough, chest tightness, shortness of breath and wheezing.   ?Cardiovascular:  Negative for chest pain and palpitations.  ?Gastrointestinal:  Negative for abdominal distention, abdominal pain, anal bleeding, blood in stool, constipation, diarrhea, nausea and vomiting.  ?Endocrine: Negative for polyphagia and polyuria.  ?Genitourinary:  Negative for difficulty urinating, frequency, hematuria and urgency.  ?Musculoskeletal:  Positive for back pain. Negative for gait problem.  ?Neurological:  Negative for syncope, weakness, light-headedness, numbness  and headaches.  ? ?  ?Objective:  ?  ?Physical Exam ?Vitals and nursing note reviewed.  ?Constitutional:   ?   General: He is not in acute distress. ?   Appearance: Normal appearance. He is not ill-appearing, toxic-appearing or diaphoretic.  ?HENT:  ?   Head: Normocephalic and atraumatic.  ?   Right Ear: Tympanic membrane, ear canal and external ear normal.  ?   Left Ear: External ear normal.  ?   Mouth/Throat:  ?   Mouth: Mucous membranes are moist.  ?   Pharynx: Oropharynx is clear. No oropharyngeal exudate or posterior oropharyngeal erythema.  ?Eyes:  ?   General: No scleral icterus.    ?   Right eye: No discharge.     ?   Left eye: No discharge.  ?   Extraocular Movements: Extraocular movements intact.  ?   Conjunctiva/sclera: Conjunctivae normal.  ?   Pupils: Pupils are equal, round, and reactive to light.  ?Neck:  ?   Vascular: No carotid bruit.  ?Cardiovascular:  ?   Rate and Rhythm: Normal rate and regular rhythm.  ?Pulmonary:  ?   Effort: Pulmonary effort is normal. No respiratory distress.  ?   Breath sounds: Normal breath sounds. No wheezing, rhonchi or rales.  ?Abdominal:  ?   General: Abdomen is flat. Bowel sounds are normal. There is no  distension.  ?   Palpations: There is no mass.  ?   Tenderness: There is no abdominal tenderness. There is no guarding or rebound.  ?   Hernia: No hernia is present.  ?Musculoskeletal:  ?   Cervical back: Normal

## 2021-08-06 LAB — HIV ANTIBODY (ROUTINE TESTING W REFLEX): HIV 1&2 Ab, 4th Generation: NONREACTIVE

## 2021-08-22 ENCOUNTER — Ambulatory Visit: Payer: Self-pay | Admitting: General Surgery

## 2021-08-22 ENCOUNTER — Telehealth: Payer: Self-pay | Admitting: Family Medicine

## 2021-08-22 DIAGNOSIS — I10 Essential (primary) hypertension: Secondary | ICD-10-CM

## 2021-08-22 DIAGNOSIS — K4091 Unilateral inguinal hernia, without obstruction or gangrene, recurrent: Secondary | ICD-10-CM | POA: Diagnosis not present

## 2021-08-22 NOTE — Telephone Encounter (Signed)
Please advise message below  °

## 2021-08-22 NOTE — Telephone Encounter (Signed)
Pt brought a form In that he got in the mail stating his doxazosin (CARDURA) 2 MG tablet [[id number]] has been recalled. He still has 3 weeks left of this medication. He is wanting another script comparable to this one sent to  ? ?[name] [id number] - [address]  ?[place], [place] [address]  ?Phone:  [phone]  Fax:  [phone]  ? ?I have put the Recall letter in Dr. Bebe Shaggy folder up front.  ?

## 2021-08-22 NOTE — H&P (Signed)
Chief Complaint: Follow-up (Pain at hernia repair site) ?  ?  ?  ?History of Present Illness: ?[name] is a 81 y.o. male who is seen today as an office consultation at the request of Dr. Pasty Arch for evaluation of Follow-up (Pain at hernia repair site) ?Marland Kitchen   ?Patient is an 81 year old male, with a history of hypothyroidism, hyperlipidemia, hypertension, comes in with a really recurrent right inguinal hernia. ?Patient previously had a hernia fixed by Dr. Hulen Skains in 2016.  This was done in an open fashion. ?  ?Patient states that recently he has noticed some pain discomfort to the right inguinal area that radiates down to the groin and his right flank area.  He has noticed a bulge.  He states that it is gotten larger.  He does not describe any signs or symptoms of incarceration or strangulation. ?  ?Patient's had previous umbilical and left inguinal hernia as well as a left flank hernia. ?  ?  ?Review of Systems: ?A complete review of systems was obtained from the patient.  I have reviewed this information and discussed as appropriate with the patient.  See HPI as well for other ROS. ?  ?Review of Systems  ?Constitutional: Negative for fever.  ?HENT: Negative for congestion.   ?Eyes: Negative for blurred vision.  ?Respiratory: Negative for cough, shortness of breath and wheezing.   ?Cardiovascular: Negative for chest pain and palpitations.  ?Gastrointestinal: Negative for heartburn.  ?Genitourinary: Negative for dysuria.  ?Musculoskeletal: Negative for myalgias.  ?Skin: Negative for rash.  ?Neurological: Negative for dizziness and headaches.  ?Psychiatric/Behavioral: Negative for depression and suicidal ideas.  ?All other systems reviewed and are negative. ?  ?  ?  ?Medical History: ?Past Medical History ?Past Medical History: ?Diagnosis Date ? Basal cell carcinoma   ? Glaucoma (increased eye pressure)   ? History of throat cancer   ? Hypertension   ? Hyperthyroidism   ? Vision abnormalities   ? ?  ?  ?Patient  Active Problem List ?Diagnosis ? Pseudophakia of left eye ? Primary open angle glaucoma of both eyes, moderate stage ? Dry eye syndrome ? Dysphagia, oropharyngeal ?  ?  ?Past Surgical History ?Past Surgical History: ?Procedure Laterality Date ? GLAUCOMA EYE SURGERY Right 03-27-2008 ?  LPI-OD ? GLAUCOMA EYE SURGERY Left 09/08/12 ?  slt os dr ziel ? GLAUCOMA EYE SURGERY Right 11/03/12 ?  slt od dr ziel ? Back Surgery     ? EXTRACTION CATARACT EXTRACAPSULAR W/INSERTION INTRAOCULAR PROSTHESIS Left   ?  CEOD ? EYELID SURGERY Bilateral 12 years ?  blepharoplasty ? laser left eye     ? throat cancer     ? ?  ?  ?Allergies ?Allergies ?Allergen Reactions ? Amlodipine Other (See Comments) ?    Swollen gums ?Swollen gums ?Swollen gums ?  ? Bimatoprost Other (See Comments) ?    Toxicity ?Toxicity ?Toxicity ?  ? Brimonidine-Timolol Other (See Comments) ?    Toxicity ?Toxicity ?  ? Latanoprost Other (See Comments) ?    Toxicity ?Toxicity ?  ? Travoprost Other (See Comments) ?    Toxicity ? Codeine Nausea ? Travoprost (Benzalkonium) Other (See Comments) ?    Toxicity ? Unable To Assess Vomiting ?    Tylenol # 3 ? ?  ?  ?Current Outpatient Medications on File Prior to Visit ?Medication Sig Dispense Refill ? dorzolamide-timolol (COSOPT) 22.3-6.8 mg/mL ophthalmic solution Place into both eyes 2 (two) times daily.  ?    4 ? doxazosin (  CARDURA) 4 MG tablet       ? escitalopram oxalate (LEXAPRO) 20 MG tablet Take by mouth.     ? fluticasone (FLONASE) 50 mcg/actuation nasal spray       ? FUROsemide (LASIX) 20 MG tablet       ? levothyroxine (SYNTHROID, LEVOTHROID) 50 MCG tablet Reported on 04/24/2015 ?      ? mometasone (ELOCON) 0.1 % lotion       ? omeprazole (PRILOSEC) 20 MG DR capsule       ? simvastatin (ZOCOR) 20 MG tablet       ? tafluprost, PF, (ZIOPTAN PF) 0.0015 % opthalmic solution Place into both eyes nightly. Reported on 04/24/2015 ?      ?  ?No current facility-administered medications on file prior to visit. ?  ?  ?Family  History ?Family History ?Problem Relation Age of Onset ? Cataracts Mother   ? Vision loss Mother   ? Cataracts Father   ? Allergic rhinitis Neg Hx   ? Amblyopia Neg Hx   ? Ankylosing spondylitis Neg Hx   ? Atrial fibrillation (Abnormal heart rhythm sometimes requiring treatment with blood thinners) Neg Hx   ? Basal cell carcinoma Neg Hx   ? Blindness Neg Hx   ? Coronary Artery Disease (Blocked arteries around heart) Neg Hx   ? Diabetes Neg Hx   ? Diabetes type I Neg Hx   ? Diabetes type II Neg Hx   ? Glaucoma Neg Hx   ? Graves' disease Neg Hx   ? Hashimoto's thyroiditis Neg Hx   ? High blood pressure (Hypertension) Neg Hx   ? Hyperthyroidism Neg Hx   ? Hypothyroidism Neg Hx   ? Macular degeneration Neg Hx   ? Melanoma Neg Hx   ? Neuropathy Neg Hx   ? Renal Insufficiency Neg Hx   ? Retinal degeneration Neg Hx   ? Retinopathy of prematurity Neg Hx   ? Rheum arthritis Neg Hx   ? Sarcoidosis Neg Hx   ? Sinusitis Neg Hx   ? Sleep apnea Neg Hx   ? Strabismus Neg Hx   ? Thyroid disease Neg Hx   ? ?  ?  ?Social History ?  ?Tobacco Use ?Smoking Status Never ?Smokeless Tobacco Not on file ?  ?  ?Social History ?Social History ?  ? ?Socioeconomic History ? Marital status: Married ?Tobacco Use ? Smoking status: Never ? ?  ?  ?Objective: ?  ?  ?Vitals: ?  08/22/21 1010 ?BP: 132/82 ?Pulse: 77 ?Temp: 36.7 ?C (98 ?F) ?SpO2: 97% ?Weight: 70.8 kg (156 lb) ?Height: 175.3 cm ('5\' 9"'$ ) ?  ?Body mass index is 23.04 kg/m?Marland Kitchen ?Physical Exam ?Constitutional:   ?   Appearance: Normal appearance.  ?HENT:  ?   Head: Normocephalic and atraumatic.  ?   Nose: Nose normal. No congestion.  ?   Mouth/Throat:  ?   Mouth: Mucous membranes are moist.  ?   Pharynx: Oropharynx is clear.  ?Eyes:  ?   Pupils: Pupils are equal, round, and reactive to light.  ?Cardiovascular:  ?   Rate and Rhythm: Normal rate and regular rhythm.  ?   Pulses: Normal pulses.  ?   Heart sounds: Normal heart sounds. No murmur heard. ?  No friction rub. No gallop.  ?Pulmonary:  ?    Effort: Pulmonary effort is normal. No respiratory distress.  ?   Breath sounds: Normal breath sounds. No stridor. No wheezing, rhonchi or rales.  ?Abdominal:  ?  General: Abdomen is flat.  ?   Hernia: A hernia is present. Hernia is present in the right inguinal area.  ?Musculoskeletal:     ?   General: Normal range of motion.  ?   Cervical back: Normal range of motion.  ?Skin: ?   General: Skin is warm and dry.  ?Neurological:  ?   General: No focal deficit present.  ?   Mental Status: He is alert and oriented to person, place, and time.  ?Psychiatric:     ?   Mood and Affect: Mood normal.     ?   Thought Content: Thought content normal.  ?  ?  ?  ?  ?Assessment and Plan: ?Diagnoses and all orders for this visit: ?  ?Recurrent right inguinal hernia ?  ?  ?[name] is a 81 y.o. male  ?  ?1.  We will proceed to the OR for a laparoscopic right inguinal hernia repair with mesh. ?2. All risks and benefits were discussed with the patient, to generally include infection, bleeding, damage to surrounding structures, acute and chronic nerve pain, and recurrence. Alternatives were offered and described.  All questions were answered and the patient voiced understanding of the procedure and wishes to proceed at this point. ?  ?  ?  ?  ?  ?  ?No follow-ups on file. ?  ?[name], MD, FACS ?[facility], [place] ?General & Minimally Invasive Surgery ? ? ?

## 2021-08-24 ENCOUNTER — Other Ambulatory Visit: Payer: Self-pay | Admitting: Family Medicine

## 2021-08-24 DIAGNOSIS — I10 Essential (primary) hypertension: Secondary | ICD-10-CM

## 2021-08-25 ENCOUNTER — Telehealth: Payer: Self-pay | Admitting: Family Medicine

## 2021-08-25 MED ORDER — HYDROCHLOROTHIAZIDE 25 MG PO TABS: 25.0000 mg | ORAL_TABLET | Freq: Every day | ORAL | 1 refills | Status: DC

## 2021-08-25 NOTE — Telephone Encounter (Signed)
Patient aware that Rx was changed to preferred pharmacy and it should be ready today.  ?

## 2021-08-25 NOTE — Addendum Note (Signed)
Addended by: Lynda Rainwater on: 08/25/2021 01:38 PM ? ? Modules accepted: Orders ? ?

## 2021-08-25 NOTE — Telephone Encounter (Signed)
Pt's hydrochlorothiazide (HYDRODIURIL) 25 MG tablet [[id number]] needs to be sent to [name] at [facility]. This message goes with the telephone encounter from 08/22/21. Please advise pt what Dr. Ethelene Hal says about changing his med. [phone] ?

## 2021-09-05 DIAGNOSIS — H401132 Primary open-angle glaucoma, bilateral, moderate stage: Secondary | ICD-10-CM | POA: Diagnosis not present

## 2021-09-08 ENCOUNTER — Telehealth: Payer: Self-pay

## 2021-09-08 DIAGNOSIS — I10 Essential (primary) hypertension: Secondary | ICD-10-CM

## 2021-09-08 NOTE — Telephone Encounter (Signed)
Pt asked to speak with [place] concerning his ?Medication hydrochlorothiazide '25mg'$  tablet. ? ?He asked to be called back at [phone] ? ?Thanks ? ?

## 2021-09-09 NOTE — Telephone Encounter (Signed)
Spoke with patient who states that he have stopped taking Hydrochlorthiazide due to low BP readings 80/44 and causing urinary issues patient states that he would like to start back on Doxazosin since he really did not have any issues with this. Please advise patient would also like a refill on Doxazosin  ?

## 2021-09-10 NOTE — Telephone Encounter (Signed)
Called patient to inform about medication no answer LMTCB  ?

## 2021-09-11 MED ORDER — DOXAZOSIN MESYLATE 2 MG PO TABS: 2.0000 mg | ORAL_TABLET | Freq: Every day | ORAL | 0 refills | Status: DC

## 2021-09-11 NOTE — Addendum Note (Signed)
Addended by: Lynda Rainwater on: 09/11/2021 09:49 AM ? ? Modules accepted: Orders ? ?

## 2021-09-11 NOTE — Telephone Encounter (Signed)
Spoke with patient who states that he have started back on Doxazosin with no issues with hypotension or urine issues per patient he will not take Hydrochlorothiazide no longer, will monitor BP reading and give [place] a call with any concerns.  ?

## 2021-09-22 NOTE — Pre-Procedure Instructions (Signed)
Surgical Instructions    Your procedure is scheduled on Wednesday, May 31st.  Report to [facility] Main Entrance "A" at 5:30 A.M., then check in with the Admitting office.  Call this number if you have problems the morning of surgery:  [phone]   If you have any questions prior to your surgery date call [phone]: Open Monday-Friday 8am-4pm    Remember:  Do not eat after midnight the night before your surgery  You may drink clear liquids until 4:30 a.m. the morning of your surgery.   Clear liquids allowed are: Water, Non-Citrus Juices (without pulp), Carbonated Beverages, Clear Tea, Black Coffee Only (NO MILK, CREAM OR POWDERED CREAMER of any kind), and Gatorade.  Patient Instructions  The day of surgery (if you do NOT have diabetes):  Drink ONE (1) Pre-Surgery Clear Ensure by 4:30 am the morning of surgery   This drink was given to you during your hospital  pre-op appointment visit. Nothing else to drink after completing the  Pre-Surgery Clear Ensure.         If you have questions, please contact your surgeon's office.     Take these medicines the morning of surgery with A SIP OF WATER  doxazosin (CARDURA) escitalopram (LEXAPRO) levothyroxine (SYNTHROID) dorzolamide-timolol (COSOPT)  As needed: fluticasone (FLONASE)   As of today, STOP taking any Aspirin (unless otherwise instructed by your surgeon) Aleve, Naproxen, Ibuprofen, Motrin, Advil, Goody's, BC's, all herbal medications, fish oil, and all vitamins.                     Do NOT Smoke (Tobacco/Vaping) for 24 hours prior to your procedure.  If you use a CPAP at night, you may bring your mask/headgear for your overnight stay.   Contacts, glasses, piercing's, hearing aid's, dentures or partials may not be worn into surgery, please bring cases for these belongings.    For patients admitted to the hospital, discharge time will be determined by your treatment team.   Patients discharged the day of surgery will  not be allowed to drive home, and someone needs to stay with them for 24 hours.  SURGICAL WAITING ROOM VISITATION Patients having surgery or a procedure may have two support people in the waiting room. These visitors may be switched out with other visitors if needed. Children under the age of 61 must have an adult accompany them who is not the patient. If the patient needs to stay at the hospital during part of their recovery, the visitor guidelines for inpatient rooms apply.  Please refer to the [facility] website for the visitor guidelines for Inpatients (after your surgery is over and you are in a regular room).    Special instructions:   [facility]- Preparing For Surgery  Before surgery, you can play an important role. Because skin is not sterile, your skin needs to be as free of germs as possible. You can reduce the number of germs on your skin by washing with CHG (chlorahexidine gluconate) Soap before surgery.  CHG is an antiseptic cleaner which kills germs and bonds with the skin to continue killing germs even after washing.    Oral Hygiene is also important to reduce your risk of infection.  Remember - BRUSH YOUR TEETH THE MORNING OF SURGERY WITH YOUR REGULAR TOOTHPASTE  Please do not use if you have an allergy to CHG or antibacterial soaps. If your skin becomes reddened/irritated stop using the CHG.  Do not shave (including legs and underarms) for at least 48  hours prior to first CHG shower. It is OK to shave your face.  Please follow these instructions carefully.   Shower the NIGHT BEFORE SURGERY and the MORNING OF SURGERY  If you chose to wash your hair, wash your hair first as usual with your normal shampoo.  After you shampoo, rinse your hair and body thoroughly to remove the shampoo.  Use CHG Soap as you would any other liquid soap. You can apply CHG directly to the skin and wash gently with a scrungie or a clean washcloth.   Apply the CHG Soap to your body ONLY FROM THE  NECK DOWN.  Do not use on open wounds or open sores. Avoid contact with your eyes, ears, mouth and genitals (private parts). Wash Face and genitals (private parts)  with your normal soap.   Wash thoroughly, paying special attention to the area where your surgery will be performed.  Thoroughly rinse your body with warm water from the neck down.  DO NOT shower/wash with your normal soap after using and rinsing off the CHG Soap.  Pat yourself dry with a CLEAN TOWEL.  Wear CLEAN PAJAMAS to bed the night before surgery  Place CLEAN SHEETS on your bed the night before your surgery  DO NOT SLEEP WITH PETS.   Day of Surgery: Take a shower with CHG soap. Do not wear jewelry. Do not wear lotions, powders, colognes, or deodorant. Men may shave face and neck. Do not bring valuables to the hospital.  [facility] is not responsible for any belongings or valuables. Wear Clean/Comfortable clothing the morning of surgery Remember to brush your teeth WITH YOUR REGULAR TOOTHPASTE.   Please read over the following fact sheets that you were given.    If you received a COVID test during your pre-op visit  it is requested that you wear a mask when out in public, stay away from anyone that may not be feeling well and notify your surgeon if you develop symptoms. If you have been in contact with anyone that has tested positive in the last 10 days please notify you surgeon.

## 2021-09-23 ENCOUNTER — Encounter (HOSPITAL_COMMUNITY)
Admission: RE | Admit: 2021-09-23 | Discharge: 2021-09-23 | Disposition: A | Discharge status: Home / Self Care | Payer: Medicare HMO | Source: Ambulatory Visit | Attending: General Surgery | Admitting: General Surgery

## 2021-09-23 ENCOUNTER — Encounter (HOSPITAL_COMMUNITY): Payer: Self-pay

## 2021-09-23 ENCOUNTER — Other Ambulatory Visit: Payer: Self-pay

## 2021-09-23 VITALS — BP 171/89 | HR 63 | Temp 97.8°F | Resp 17 | Ht 69.0 in | Wt 154.2 lb

## 2021-09-23 DIAGNOSIS — Z01818 Encounter for other preprocedural examination: Secondary | ICD-10-CM | POA: Insufficient documentation

## 2021-09-23 DIAGNOSIS — I2510 Atherosclerotic heart disease of native coronary artery without angina pectoris: Secondary | ICD-10-CM | POA: Diagnosis not present

## 2021-09-23 LAB — CBC
HCT: 36.6 % — ABNORMAL LOW (ref 39.0–52.0)
Hemoglobin: 12.6 g/dL — ABNORMAL LOW (ref 13.0–17.0)
MCH: 33.5 pg (ref 26.0–34.0)
MCHC: 34.4 g/dL (ref 30.0–36.0)
MCV: 97.3 fL (ref 80.0–100.0)
Platelets: 139 10*3/uL — ABNORMAL LOW (ref 150–400)
RBC: 3.76 MIL/uL — ABNORMAL LOW (ref 4.22–5.81)
RDW: 13.2 % (ref 11.5–15.5)
WBC: 3.1 10*3/uL — ABNORMAL LOW (ref 4.0–10.5)
nRBC: 0 % (ref 0.0–0.2)

## 2021-09-23 LAB — BASIC METABOLIC PANEL
Anion gap: 3 — ABNORMAL LOW (ref 5–15)
BUN: 10 mg/dL (ref 8–23)
CO2: 31 mmol/L (ref 22–32)
Calcium: 9.1 mg/dL (ref 8.9–10.3)
Chloride: 108 mmol/L (ref 98–111)
Creatinine, Ser: 1.02 mg/dL (ref 0.61–1.24)
GFR, Estimated: >60 mL/min (ref >60)
Glucose, Bld: 95 mg/dL (ref 70–99)
Potassium: 3.6 mmol/L (ref 3.5–5.1)
Sodium: 142 mmol/L (ref 135–145)

## 2021-09-23 NOTE — Progress Notes (Signed)
PCP - Dr. Abelino Derrick Cardiologist - denies  PPM/ICD - n/a   Chest x-ray - n/a EKG - 09/23/21 Stress Test - denies ECHO - denies Cardiac Cath - denies  Sleep Study - denies CPAP - denies  Blood Thinner Instructions: n/a Aspirin Instructions: n/a  ERAS Protcol -Clear liquids until 0430 DOS PRE-SURGERY Ensure or G2- Ensure provided  COVID TEST- n/a   Anesthesia review: No  Patient denies shortness of breath, fever, cough and chest pain at PAT appointment   All instructions explained to the patient, with a verbal understanding of the material. Patient agrees to go over the instructions while at home for a better understanding. Patient also instructed to self quarantine after being tested for COVID-19. The opportunity to ask questions was provided.

## 2021-09-30 ENCOUNTER — Telehealth: Payer: Self-pay | Admitting: Family Medicine

## 2021-09-30 DIAGNOSIS — F411 Generalized anxiety disorder: Secondary | ICD-10-CM

## 2021-09-30 MED ORDER — ESCITALOPRAM OXALATE 20 MG PO TABS: 20.0000 mg | ORAL_TABLET | Freq: Every day | ORAL | 0 refills | Status: DC

## 2021-09-30 NOTE — Telephone Encounter (Signed)
Please advise message below refill request for pending Rx 

## 2021-09-30 NOTE — H&P (Signed)
Chief Complaint: Follow-up (Pain at hernia repair site)       History of Present Illness: [name] is a 81 y.o. male who is seen today as an office consultation at the request of Dr. Pasty Arch for evaluation of Follow-up (Pain at hernia repair site) .   Patient is an 81 year old male, with a history of hypothyroidism, hyperlipidemia, hypertension, comes in with a really recurrent right inguinal hernia. Patient previously had a hernia fixed by Dr. Hulen Skains in 2016.  This was done in an open fashion.   Patient states that recently he has noticed some pain discomfort to the right inguinal area that radiates down to the groin and his right flank area.  He has noticed a bulge.  He states that it is gotten larger.  He does not describe any signs or symptoms of incarceration or strangulation.   Patient's had previous umbilical and left inguinal hernia as well as a left flank hernia.     Review of Systems: A complete review of systems was obtained from the patient.  I have reviewed this information and discussed as appropriate with the patient.  See HPI as well for other ROS.   Review of Systems  Constitutional: Negative for fever.  HENT: Negative for congestion.   Eyes: Negative for blurred vision.  Respiratory: Negative for cough, shortness of breath and wheezing.   Cardiovascular: Negative for chest pain and palpitations.  Gastrointestinal: Negative for heartburn.  Genitourinary: Negative for dysuria.  Musculoskeletal: Negative for myalgias.  Skin: Negative for rash.  Neurological: Negative for dizziness and headaches.  Psychiatric/Behavioral: Negative for depression and suicidal ideas.  All other systems reviewed and are negative.       Medical History: Past Medical History Past Medical History: Diagnosis        Date            Basal cell carcinoma                Glaucoma (increased eye pressure)               History of throat cancer                       Hypertension                 Hyperthyroidism                       Vision abnormalities            Patient Active Problem List Diagnosis            Pseudophakia of left eye            Primary open angle glaucoma of both eyes, moderate stage            Dry eye syndrome            Dysphagia, oropharyngeal     Past Surgical History Past Surgical History: Procedure       Laterality         Date            GLAUCOMA EYE SURGERY            Right    03-27-2008              LPI-OD            GLAUCOMA EYE SURGERY            Left  09/08/12              slt os dr ziel            GLAUCOMA EYE SURGERY            Right    11/03/12              slt od dr ziel            Back Surgery                            EXTRACTION CATARACT EXTRACAPSULAR W/INSERTION INTRAOCULAR PROSTHESIS            Left                     CEOD            EYELID SURGERY    Bilateral           12 years              blepharoplasty            laser left eye                              throat cancer                        Allergies Allergies Allergen           Reactions            Amlodipine      Other (See Comments)                           Swollen gums Swollen gums Swollen gums              Bimatoprost     Other (See Comments)                           Toxicity Toxicity Toxicity              Brimonidine-Timolol    Other (See Comments)                           Toxicity Toxicity              Latanoprost     Other (See Comments)                           Toxicity Toxicity              Travoprost       Other (See Comments)                           Toxicity            Codeine           Nausea            Travoprost (Benzalkonium)    Other (See Comments)                           Toxicity            Unable To  Assess       Vomiting                           Tylenol # 3       Current Outpatient Medications on File Prior to Visit Medication       Sig       Dispense         Refill            dorzolamide-timolol (COSOPT)  22.3-6.8 mg/mL ophthalmic solution          Place into both eyes 2 (two) times daily.                            4            doxazosin (CARDURA) 4 MG tablet                                         escitalopram oxalate (LEXAPRO) 20 MG tablet        Take by mouth.                                     fluticasone (FLONASE) 50 mcg/actuation nasal spray                                                 FUROsemide (LASIX) 20 MG tablet                                         levothyroxine (SYNTHROID, LEVOTHROID) 50 MCG tablet           Reported on 04/24/2015                                        mometasone (ELOCON) 0.1 % lotion                                                   omeprazole (PRILOSEC) 20 MG DR capsule                                                  simvastatin (ZOCOR) 20 MG tablet                                          tafluprost, PF, (ZIOPTAN PF) 0.0015 % opthalmic solution  Place into both eyes nightly. Reported on 04/24/2015  No current facility-administered medications on file prior to visit.     Family History Family History Problem           Relation           Age of Onset            Cataracts         Mother              Vision loss       Mother              Cataracts         Father               Allergic rhinitis Neg Hx                         Amblyopia       Neg Hx                         Ankylosing spondylitis Neg Hx                         Atrial fibrillation (Abnormal heart rhythm sometimes requiring treatment with blood thinners)           Neg Hx                         Basal cell carcinoma   Neg Hx                         Blindness        Neg Hx                         Coronary Artery Disease (Blocked arteries around heart)     Neg Hx                         Diabetes          Neg Hx                         Diabetes type I            Neg Hx                         Diabetes type II           Neg Hx                         Glaucoma         Neg Hx                         Graves' disease          Neg Hx                         Hashimoto's thyroiditis            Neg Hx                         High blood pressure (Hypertension)   Neg Hx  Hyperthyroidism          Neg Hx                         Hypothyroidism           Neg Hx                         Macular degeneration Neg Hx                         Melanoma       Neg Hx                         Neuropathy     Neg Hx                         Renal Insufficiency      Neg Hx                         Retinal degeneration   Neg Hx                         Retinopathy of prematurity     Neg Hx                         Rheum arthritis           Neg Hx                         Sarcoidosis     Neg Hx                         Sinusitis           Neg Hx                         Sleep apnea    Neg Hx                         Strabismus      Neg Hx                         Thyroid disease          Neg Hx                    Social History   Tobacco Use Smoking Status           Never Smokeless Tobacco   Not on file     Social History Social History     Socioeconomic History            Marital status:  Married Tobacco Use            Smoking status:          Never       Objective:     Vitals:              08/22/21 1010 BP:      132/82 Pulse:  77 Temp:  36.7 C (98 F) SpO2:  97% Weight:  70.8 kg (156 lb) Height: 175.3 cm ('5\' 9"'$ )   Body mass index is 23.04 kg/m. Physical Exam Constitutional:      Appearance: Normal appearance.  HENT:     Head: Normocephalic and atraumatic.     Nose: Nose normal. No congestion.     Mouth/Throat:     Mouth: Mucous membranes are moist.     Pharynx: Oropharynx is clear.  Eyes:     Pupils: Pupils are equal, round, and reactive to light.  Cardiovascular:     Rate and Rhythm: Normal rate and regular rhythm.     Pulses: Normal pulses.     Heart sounds: Normal heart sounds. No murmur heard.   No friction  rub. No gallop.  Pulmonary:     Effort: Pulmonary effort is normal. No respiratory distress.     Breath sounds: Normal breath sounds. No stridor. No wheezing, rhonchi or rales.  Abdominal:     General: Abdomen is flat.     Hernia: A hernia is present. Hernia is present in the right inguinal area.  Musculoskeletal:        General: Normal range of motion.     Cervical back: Normal range of motion.  Skin:    General: Skin is warm and dry.  Neurological:     General: No focal deficit present.     Mental Status: He is alert and oriented to person, place, and time.  Psychiatric:        Mood and Affect: Mood normal.        Thought Content: Thought content normal.          Assessment and Plan: Diagnoses and all orders for this visit:   Recurrent right inguinal hernia     Sosaia MATTHE SLOANE is a 81 y.o. male    1.          We will proceed to the OR for a laparoscopic right inguinal hernia repair with mesh. 2.         All risks and benefits were discussed with the patient, to generally include infection, bleeding, damage to surrounding structures, acute and chronic nerve pain, and recurrence. Alternatives were offered and described.  All questions were answered and the patient voiced understanding of the procedure and wishes to proceed at this point.             No follow-ups on file.   [name], MD, [facility], [facility]

## 2021-09-30 NOTE — Telephone Encounter (Signed)
Caller Name: Roni, self Call back phone #: [phone]  MEDICATION(S): escitalopram (LEXAPRO) 20 MG tablet  - pt states he needs 6 month supply sent in  Preferred Pharmacy: [facility]

## 2021-09-30 NOTE — Anesthesia Preprocedure Evaluation (Signed)
Anesthesia Evaluation  Patient identified by MRN, date of birth, ID band Patient awake    Reviewed: Allergy & Precautions, NPO status , Patient's Chart, lab work & pertinent test results  History of Anesthesia Complications Negative for: history of anesthetic complications  Airway Mallampati: I  TM Distance: >3 FB Neck ROM: Full    Dental  (+) Teeth Intact, Caps, Dental Advisory Given   Pulmonary neg pulmonary ROS, former smoker,    Pulmonary exam normal        Cardiovascular hypertension, Normal cardiovascular exam     Neuro/Psych negative neurological ROS     GI/Hepatic Neg liver ROS, hiatal hernia, GERD  ,  Endo/Other  Hypothyroidism   Renal/GU negative Renal ROS  negative genitourinary   Musculoskeletal negative musculoskeletal ROS (+)   Abdominal   Peds  Hematology  (+) Blood dyscrasia, anemia ,   Anesthesia Other Findings  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 Vocal folds are mobile for AB-duction and AD-duction. Bilateral vocal fold atrophy."  Airway 08/06/20: easy MV, Mac 4 grade 2, 1 attempt  Reproductive/Obstetrics                            Anesthesia Physical Anesthesia Plan  ASA: 3  Anesthesia Plan: General   Post-op Pain Management: Tylenol PO (pre-op)* and Toradol IV (intra-op)*   Induction: Intravenous  PONV Risk Score and Plan: 2 and Ondansetron, Dexamethasone, Treatment may vary due to age or medical condition and Midazolam  Airway Management Planned: Oral ETT and Video Laryngoscope Planned  Additional Equipment: None  Intra-op Plan:   Post-operative Plan: Extubation in OR  Informed Consent: I have reviewed the patients History and Physical, chart, labs and discussed the procedure including the risks, benefits and  alternatives for the proposed anesthesia with the patient or authorized representative who has indicated his/her understanding and acceptance.     Dental advisory given  Plan Discussed with:   Anesthesia Plan Comments:        Anesthesia Quick Evaluation

## 2021-09-30 NOTE — Telephone Encounter (Signed)
Patient aware that Rx sent in left on VM

## 2021-10-01 ENCOUNTER — Ambulatory Visit (HOSPITAL_COMMUNITY)
Admission: RE | Admit: 2021-10-01 | Discharge: 2021-10-01 | Disposition: A | Discharge status: Home / Self Care | Payer: Medicare HMO | Attending: General Surgery | Admitting: General Surgery

## 2021-10-01 ENCOUNTER — Other Ambulatory Visit: Payer: Self-pay

## 2021-10-01 ENCOUNTER — Ambulatory Visit (HOSPITAL_BASED_OUTPATIENT_CLINIC_OR_DEPARTMENT_OTHER): Payer: Medicare HMO | Admitting: Anesthesiology

## 2021-10-01 ENCOUNTER — Encounter (HOSPITAL_COMMUNITY): Payer: Self-pay | Admitting: General Surgery

## 2021-10-01 ENCOUNTER — Encounter (HOSPITAL_COMMUNITY)
Admission: RE | Disposition: A | Discharge status: Home / Self Care | Payer: Self-pay | Source: Home / Self Care | Attending: General Surgery

## 2021-10-01 ENCOUNTER — Ambulatory Visit (HOSPITAL_COMMUNITY): Payer: Medicare HMO | Admitting: Anesthesiology

## 2021-10-01 DIAGNOSIS — D638 Anemia in other chronic diseases classified elsewhere: Secondary | ICD-10-CM | POA: Diagnosis not present

## 2021-10-01 DIAGNOSIS — E785 Hyperlipidemia, unspecified: Secondary | ICD-10-CM | POA: Insufficient documentation

## 2021-10-01 DIAGNOSIS — I10 Essential (primary) hypertension: Secondary | ICD-10-CM

## 2021-10-01 DIAGNOSIS — E039 Hypothyroidism, unspecified: Secondary | ICD-10-CM | POA: Diagnosis not present

## 2021-10-01 DIAGNOSIS — D649 Anemia, unspecified: Secondary | ICD-10-CM

## 2021-10-01 DIAGNOSIS — K4091 Unilateral inguinal hernia, without obstruction or gangrene, recurrent: Secondary | ICD-10-CM | POA: Insufficient documentation

## 2021-10-01 DIAGNOSIS — K4090 Unilateral inguinal hernia, without obstruction or gangrene, not specified as recurrent: Secondary | ICD-10-CM

## 2021-10-01 HISTORY — PX: INSERTION OF MESH: SHX5868

## 2021-10-01 HISTORY — PX: INGUINAL HERNIA REPAIR: SHX194

## 2021-10-01 SURGERY — REPAIR, HERNIA, INGUINAL, LAPAROSCOPIC
Anesthesia: General | Site: Inguinal | Laterality: Right

## 2021-10-01 MED ORDER — EPHEDRINE 5 MG/ML INJ
INTRAVENOUS | Status: AC
  Filled 2021-10-01: qty 5

## 2021-10-01 MED ORDER — ONDANSETRON HCL 4 MG/2ML IJ SOLN
INTRAMUSCULAR | Status: DC | PRN
  Administered 2021-10-01: 4 mg via INTRAVENOUS

## 2021-10-01 MED ORDER — TRAMADOL HCL 50 MG PO TABS
ORAL_TABLET | ORAL | Status: AC
  Filled 2021-10-01: qty 1

## 2021-10-01 MED ORDER — ROCURONIUM BROMIDE 10 MG/ML (PF) SYRINGE
PREFILLED_SYRINGE | INTRAVENOUS | Status: AC
  Filled 2021-10-01: qty 10

## 2021-10-01 MED ORDER — EPHEDRINE SULFATE-NACL 50-0.9 MG/10ML-% IV SOSY
PREFILLED_SYRINGE | INTRAVENOUS | Status: DC | PRN
  Administered 2021-10-01 (×2): 10 mg via INTRAVENOUS
  Administered 2021-10-01: 5 mg via INTRAVENOUS

## 2021-10-01 MED ORDER — AMISULPRIDE (ANTIEMETIC) 5 MG/2ML IV SOLN: 10.0000 mg | Freq: Once | INTRAVENOUS | Status: DC | PRN

## 2021-10-01 MED ORDER — FENTANYL CITRATE (PF) 100 MCG/2ML IJ SOLN: 25.0000 ug | INTRAMUSCULAR | Status: DC | PRN

## 2021-10-01 MED ORDER — ONDANSETRON HCL 4 MG/2ML IJ SOLN
INTRAMUSCULAR | Status: AC
  Filled 2021-10-01: qty 2

## 2021-10-01 MED ORDER — ENSURE PRE-SURGERY PO LIQD: 296.0000 mL | Freq: Once | ORAL | Status: DC

## 2021-10-01 MED ORDER — FENTANYL CITRATE (PF) 250 MCG/5ML IJ SOLN
INTRAMUSCULAR | Status: AC
  Filled 2021-10-01: qty 5

## 2021-10-01 MED ORDER — LIDOCAINE 2% (20 MG/ML) 5 ML SYRINGE
INTRAMUSCULAR | Status: AC
  Filled 2021-10-01: qty 5

## 2021-10-01 MED ORDER — ACETAMINOPHEN 500 MG PO TABS
1000.0000 mg | ORAL_TABLET | Freq: Once | ORAL | Status: AC
  Administered 2021-10-01: 1000 mg via ORAL
  Filled 2021-10-01: qty 2

## 2021-10-01 MED ORDER — OXYCODONE HCL 5 MG PO TABS: 5.0000 mg | ORAL_TABLET | Freq: Once | ORAL | Status: DC | PRN

## 2021-10-01 MED ORDER — DEXAMETHASONE SODIUM PHOSPHATE 10 MG/ML IJ SOLN
INTRAMUSCULAR | Status: AC
  Filled 2021-10-01: qty 1

## 2021-10-01 MED ORDER — CHLORHEXIDINE GLUCONATE 0.12 % MT SOLN
15.0000 mL | Freq: Once | OROMUCOSAL | Status: AC
  Administered 2021-10-01: 15 mL via OROMUCOSAL
  Filled 2021-10-01: qty 15

## 2021-10-01 MED ORDER — BUPIVACAINE-EPINEPHRINE (PF) 0.25% -1:200000 IJ SOLN
INTRAMUSCULAR | Status: AC
  Filled 2021-10-01: qty 30

## 2021-10-01 MED ORDER — CEFAZOLIN SODIUM-DEXTROSE 2-4 GM/100ML-% IV SOLN
2.0000 g | INTRAVENOUS | Status: AC
  Administered 2021-10-01: 2 g via INTRAVENOUS
  Filled 2021-10-01: qty 100

## 2021-10-01 MED ORDER — ONDANSETRON HCL 4 MG/2ML IJ SOLN: 4.0000 mg | Freq: Once | INTRAMUSCULAR | Status: DC | PRN

## 2021-10-01 MED ORDER — CHLORHEXIDINE GLUCONATE CLOTH 2 % EX PADS: 6.0000 | MEDICATED_PAD | Freq: Once | CUTANEOUS | Status: DC

## 2021-10-01 MED ORDER — MIDAZOLAM HCL 2 MG/2ML IJ SOLN
INTRAMUSCULAR | Status: DC | PRN
  Administered 2021-10-01: 1 mg via INTRAVENOUS

## 2021-10-01 MED ORDER — BUPIVACAINE HCL 0.25 % IJ SOLN
INTRAMUSCULAR | Status: DC | PRN
  Administered 2021-10-01: 7 mL

## 2021-10-01 MED ORDER — TRAMADOL HCL 50 MG PO TABS
50.0000 mg | ORAL_TABLET | Freq: Four times a day (QID) | ORAL | Status: DC | PRN
  Administered 2021-10-01: 50 mg via ORAL

## 2021-10-01 MED ORDER — ORAL CARE MOUTH RINSE: 15.0000 mL | Freq: Once | OROMUCOSAL | Status: AC

## 2021-10-01 MED ORDER — PHENYLEPHRINE 80 MCG/ML (10ML) SYRINGE FOR IV PUSH (FOR BLOOD PRESSURE SUPPORT)
PREFILLED_SYRINGE | INTRAVENOUS | Status: DC | PRN
  Administered 2021-10-01: 80 ug via INTRAVENOUS

## 2021-10-01 MED ORDER — PROPOFOL 10 MG/ML IV BOLUS
INTRAVENOUS | Status: DC | PRN
  Administered 2021-10-01: 100 mg via INTRAVENOUS

## 2021-10-01 MED ORDER — DEXAMETHASONE SODIUM PHOSPHATE 10 MG/ML IJ SOLN
INTRAMUSCULAR | Status: DC | PRN
  Administered 2021-10-01: 4 mg via INTRAVENOUS

## 2021-10-01 MED ORDER — 0.9 % SODIUM CHLORIDE (POUR BTL) OPTIME
TOPICAL | Status: DC | PRN
  Administered 2021-10-01: 1000 mL

## 2021-10-01 MED ORDER — MIDAZOLAM HCL 2 MG/2ML IJ SOLN
INTRAMUSCULAR | Status: AC
  Filled 2021-10-01: qty 2

## 2021-10-01 MED ORDER — SUGAMMADEX SODIUM 200 MG/2ML IV SOLN
INTRAVENOUS | Status: DC | PRN
  Administered 2021-10-01: 200 mg via INTRAVENOUS

## 2021-10-01 MED ORDER — FENTANYL CITRATE (PF) 250 MCG/5ML IJ SOLN
INTRAMUSCULAR | Status: DC | PRN
Start: 2021-10-01 — End: 2021-10-01
  Administered 2021-10-01: 100 ug via INTRAVENOUS

## 2021-10-01 MED ORDER — OXYCODONE HCL 5 MG/5ML PO SOLN: 5.0000 mg | Freq: Once | ORAL | Status: DC | PRN

## 2021-10-01 MED ORDER — LACTATED RINGERS IV SOLN: INTRAVENOUS | Status: DC

## 2021-10-01 MED ORDER — ROCURONIUM BROMIDE 10 MG/ML (PF) SYRINGE
PREFILLED_SYRINGE | INTRAVENOUS | Status: DC | PRN
  Administered 2021-10-01: 70 mg via INTRAVENOUS

## 2021-10-01 MED ORDER — TRAMADOL HCL 50 MG PO TABS: 50.0000 mg | ORAL_TABLET | Freq: Four times a day (QID) | ORAL | 0 refills | Status: DC | PRN

## 2021-10-01 MED ORDER — LIDOCAINE 2% (20 MG/ML) 5 ML SYRINGE
INTRAMUSCULAR | Status: DC | PRN
  Administered 2021-10-01: 60 mg via INTRAVENOUS

## 2021-10-01 MED ORDER — GLYCOPYRROLATE 0.2 MG/ML IJ SOLN
INTRAMUSCULAR | Status: DC | PRN
  Administered 2021-10-01: .2 mg via INTRAVENOUS

## 2021-10-01 MED ORDER — PROPOFOL 10 MG/ML IV BOLUS
INTRAVENOUS | Status: AC
  Filled 2021-10-01: qty 20

## 2021-10-01 SURGICAL SUPPLY — 45 items
ADH SKN CLS APL DERMABOND .7 (GAUZE/BANDAGES/DRESSINGS) ×4
BAG COUNTER SPONGE SURGICOUNT (BAG) ×3 IMPLANT
BAG SPNG CNTER NS LX DISP (BAG) ×2
CANISTER SUCT 3000ML PPV (MISCELLANEOUS) IMPLANT
COVER SURGICAL LIGHT HANDLE (MISCELLANEOUS) ×3 IMPLANT
DERMABOND ADVANCED (GAUZE/BANDAGES/DRESSINGS) ×2
DERMABOND ADVANCED .7 DNX12 (GAUZE/BANDAGES/DRESSINGS) ×2 IMPLANT
DISSECTOR BLUNT TIP ENDO 5MM (MISCELLANEOUS) IMPLANT
ELECT REM PT RETURN 9FT ADLT (ELECTROSURGICAL) ×3
ELECTRODE REM PT RTRN 9FT ADLT (ELECTROSURGICAL) ×2 IMPLANT
GLOVE BIO SURGEON STRL SZ7.5 (GLOVE) ×3 IMPLANT
GLOVE SURG SYN 7.5  E (GLOVE) ×3
GLOVE SURG SYN 7.5 E (GLOVE) ×2 IMPLANT
GLOVE SURG SYN 7.5 PF PI (GLOVE) ×2 IMPLANT
GOWN STRL REUS W/ TWL LRG LVL3 (GOWN DISPOSABLE) ×4 IMPLANT
GOWN STRL REUS W/ TWL XL LVL3 (GOWN DISPOSABLE) ×2 IMPLANT
GOWN STRL REUS W/TWL LRG LVL3 (GOWN DISPOSABLE) ×6
GOWN STRL REUS W/TWL XL LVL3 (GOWN DISPOSABLE) ×3
KIT BASIN OR (CUSTOM PROCEDURE TRAY) ×3 IMPLANT
KIT TURNOVER KIT B (KITS) ×3 IMPLANT
MESH 3DMAX 5X7 RT XLRG (Mesh General) ×1 IMPLANT
NDL INSUFFLATION 14GA 120MM (NEEDLE) IMPLANT
NEEDLE INSUFFLATION 14GA 120MM (NEEDLE) ×3 IMPLANT
NS IRRIG 1000ML POUR BTL (IV SOLUTION) ×3 IMPLANT
PAD ARMBOARD 7.5X6 YLW CONV (MISCELLANEOUS) ×6 IMPLANT
RELOAD STAPLE 4.0 BLU F/HERNIA (INSTRUMENTS) IMPLANT
RELOAD STAPLE 4.8 BLK F/HERNIA (STAPLE) IMPLANT
RELOAD STAPLE HERNIA 4.0 BLUE (INSTRUMENTS) ×3 IMPLANT
RELOAD STAPLE HERNIA 4.8 BLK (STAPLE) IMPLANT
SCISSORS LAP 5X35 DISP (ENDOMECHANICALS) ×3 IMPLANT
SET IRRIG TUBING LAPAROSCOPIC (IRRIGATION / IRRIGATOR) IMPLANT
SET TUBE SMOKE EVAC HIGH FLOW (TUBING) ×3 IMPLANT
STAPLER HERNIA 12 8.5 360D (INSTRUMENTS) ×1 IMPLANT
SUT MNCRL AB 4-0 PS2 18 (SUTURE) ×4 IMPLANT
SUT VIC AB 1 CT1 27 (SUTURE)
SUT VIC AB 1 CT1 27XBRD ANBCTR (SUTURE) IMPLANT
SYRINGE TOOMEY DISP (SYRINGE) ×3 IMPLANT
TOWEL GREEN STERILE (TOWEL DISPOSABLE) ×3 IMPLANT
TOWEL GREEN STERILE FF (TOWEL DISPOSABLE) ×2 IMPLANT
TRAY LAPAROSCOPIC MC (CUSTOM PROCEDURE TRAY) ×3 IMPLANT
TROCAR OPTICAL SHORT 5MM (TROCAR) ×3 IMPLANT
TROCAR OPTICAL SLV SHORT 5MM (TROCAR) ×3 IMPLANT
TROCAR XCEL 12X100 BLDLESS (ENDOMECHANICALS) ×3 IMPLANT
WARMER LAPAROSCOPE (MISCELLANEOUS) ×3 IMPLANT
WATER STERILE IRR 1000ML POUR (IV SOLUTION) ×2 IMPLANT

## 2021-10-01 NOTE — Transfer of Care (Signed)
Immediate Anesthesia Transfer of Care Note  Patient: [name]  Procedure(s) Performed: LAPAROSCOPIC INGUINAL HERNIA REPAIR (Right: Abdomen) INSERTION OF MESH (Right: Inguinal)  Patient Location: PACU  Anesthesia Type:General  Level of Consciousness: awake, alert  and oriented  Airway & Oxygen Therapy: Patient Spontanous Breathing and Patient connected to nasal cannula oxygen  Post-op Assessment: Report given to RN and Post -op Vital signs reviewed and stable  Post vital signs: Reviewed and stable  Last Vitals:  Vitals Value Taken Time  BP 160/82 10/01/21 0856  Temp    Pulse 89 10/01/21 0858  Resp 21 10/01/21 0858  SpO2 100 % 10/01/21 0858  Vitals shown include unvalidated device data.  Last Pain:  Vitals:   10/01/21 0615  TempSrc:   PainSc: 0-No pain      Patients Stated Pain Goal: 1 (02/58/52 7782)  Complications: No notable events documented.

## 2021-10-01 NOTE — Anesthesia Procedure Notes (Addendum)
Procedure Name: Intubation Date/Time: 10/01/2021 7:45 AM Performed by: Anastasio Auerbach, CRNA Pre-anesthesia Checklist: Patient identified, Emergency Drugs available, Suction available and Patient being monitored Patient Re-evaluated:Patient Re-evaluated prior to induction Oxygen Delivery Method: Circle system utilized Preoxygenation: Pre-oxygenation with 100% oxygen Induction Type: IV induction Ventilation: Mask ventilation without difficulty Laryngoscope Size: Mac and 3 Grade View: Grade I Tube type: Oral Tube size: 8.0 mm Number of attempts: 1 Airway Equipment and Method: Stylet and Oral airway Placement Confirmation: ETT inserted through vocal cords under direct vision, positive ETCO2 and breath sounds checked- equal and bilateral Secured at: 23 cm Tube secured with: Tape Dental Injury: Teeth and Oropharynx as per pre-operative assessment

## 2021-10-01 NOTE — Discharge Instructions (Signed)
[facility] _______[facility]  UMBILICAL OR INGUINAL HERNIA REPAIR: POST OP INSTRUCTIONS  Always review your discharge instruction sheet given to you by the facility where your surgery was performed. IF YOU HAVE DISABILITY OR FAMILY LEAVE FORMS, YOU MUST BRING THEM TO THE OFFICE FOR PROCESSING.   DO NOT GIVE THEM TO YOUR DOCTOR.  1. A  prescription for pain medication may be given to you upon discharge.  Take your pain medication as prescribed, if needed.  If narcotic pain medicine is not needed, then you may take acetaminophen (Tylenol) or ibuprofen (Advil) as needed. 2. Take your usually prescribed medications unless otherwise directed. If you need a refill on your pain medication, please contact your pharmacy.  They will contact our office to request authorization. Prescriptions will not be filled after 5 pm or on week-ends. 3. You should follow a light diet the first 24 hours after arrival home, such as soup and crackers, etc.  Be sure to include lots of fluids daily.  Resume your normal diet the day after surgery. 4.Most patients will experience some swelling and bruising around the umbilicus or in the groin and scrotum.  Ice packs and reclining will help.  Swelling and bruising can take several days to resolve.  6. It is common to experience some constipation if taking pain medication after surgery.  Increasing fluid intake and taking a stool softener (such as Colace) will usually help or prevent this problem from occurring.  A mild laxative (Milk of Magnesia or Miralax) should be taken according to package directions if there are no bowel movements after 48 hours. 7. Unless discharge instructions indicate otherwise, you may remove your bandages 24-48 hours after surgery, and you may shower at that time.  You may have steri-strips (small skin tapes) in place directly over the incision.  These strips should be left on the skin for 7-10 days.  If your surgeon used skin glue on the  incision, you may shower in 24 hours.  The glue will flake off over the next 2-3 weeks.  Any sutures or staples will be removed at the office during your follow-up visit. 8. ACTIVITIES:  You may resume regular (light) daily activities beginning the next day--such as daily self-care, walking, climbing stairs--gradually increasing activities as tolerated.  You may have sexual intercourse when it is comfortable.  Refrain from any heavy lifting or straining until approved by your doctor.  a.You may drive when you are no longer taking prescription pain medication, you can comfortably wear a seatbelt, and you can safely maneuver your car and apply brakes. b.RETURN TO WORK:   _____________________________________________  9.You should see your doctor in the office for a follow-up appointment approximately 2-3 weeks after your surgery.  Make sure that you call for this appointment within a day or two after you arrive home to insure a convenient appointment time. 10.OTHER INSTRUCTIONS: _________________________    _____________________________________  WHEN TO CALL YOUR DOCTOR: Fever over 101.0 Inability to urinate Nausea and/or vomiting Extreme swelling or bruising Continued bleeding from incision. Increased pain, redness, or drainage from the incision  The clinic staff is available to answer your questions during regular business hours.  Please don't hesitate to call and ask to speak to one of the nurses for clinical concerns.  If you have a medical emergency, go to the nearest emergency room or call 911.  A surgeon from [facility] is always on call at the hospital   [address], [address],   [place], [place]  [address] ?  [address] [address], [place],    [address] [phone] ? [phone] ? FAX [phone] Web site: [email or web address]  

## 2021-10-01 NOTE — Anesthesia Postprocedure Evaluation (Signed)
Anesthesia Post Note  Patient: LONDELL NOLL  Procedure(s) Performed: LAPAROSCOPIC INGUINAL HERNIA REPAIR (Right: Abdomen) INSERTION OF MESH (Right: Inguinal)     Patient location during evaluation: PACU Anesthesia Type: General Level of consciousness: awake and alert Pain management: pain level controlled Vital Signs Assessment: post-procedure vital signs reviewed and stable Respiratory status: spontaneous breathing, nonlabored ventilation and respiratory function stable Cardiovascular status: blood pressure returned to baseline and stable Postop Assessment: no apparent nausea or vomiting Anesthetic complications: no   No notable events documented.  Last Vitals:  Vitals:   10/01/21 0910 10/01/21 0925  BP: (!) 177/94 (!) 171/88  Pulse: 79 79  Resp: 15 19  Temp:  36.6 C  SpO2: 99% 100%    Last Pain:  Vitals:   10/01/21 0925  TempSrc:   PainSc: 6                  Lidia Collum

## 2021-10-01 NOTE — Op Note (Signed)
10/01/2021  8:26 AM  PATIENT:  [name]  81 y.o. male  PRE-OPERATIVE DIAGNOSIS:  RECURRENT RIGHT INGUINAL HERNIA  POST-OPERATIVE DIAGNOSIS:  RECURRENT RIGHT INGUINAL HERNIA  PROCEDURE:  Procedure(s): LAPAROSCOPIC RIGHT INGUINAL HERNIA REPAIR WITH MESH (Right)  SURGEON:  Surgeon(s) and Role:    * [name], MD - Primary  ASSISTANTS: none   ANESTHESIA:   local and general  EBL:  minimal   BLOOD ADMINISTERED:none  DRAINS: none   LOCAL MEDICATIONS USED:  BUPIVICAINE   SPECIMEN:  No Specimen  DISPOSITION OF SPECIMEN:  N/A  COUNTS:  YES  TOURNIQUET:  * No tourniquets in log *  DICTATION: .Dragon Dictation  Counts: reported as correct x 2  Findings:  The patient had a moderate right indirect hernia and a moderated sized cord lipoma  Indications for procedure:  The patient is a 81 year old male with a recurrent right inguinal hernia for several months after an open hernia repair. Patient complained of symptomatology to his right inguinal area. The patient was taken back for elective inguinal hernia repair.  Details of the procedure:  The patient was taken back to the operating room. The patient was placed in supine position with bilateral SCDs in place.  The patient was prepped and draped in the usual sterile fashion.  After appropriate anitbiotics were confirmed, a time-out was confirmed and all facts were verified.  0.25% Marcaine was used to infiltrate the umbilical area. A 11-blade was used to cut down the skin and blunt dissection was used to get the anterior fashion.  The anterior fascia was incised approximately 1 cm and the muscles were retracted laterally. Blunt dissection was then used to create a space in the preperitoneal area. At this time a 10 mm camera was then introduced into the space and advanced the pubic tubercle and a 12 mm trocar was placed over this and insufflation was started.  At this time and space was created from medial to laterally  the preperitoneal space.  Cooper's ligament was initially cleaned off.  There was some adherent tissue that was seen in the direct space area, but there was no direct hernia seen. The hernia sac was identified in the indirect space. Dissection of the hernia sac and cord structures was undertaken the vas deferens was identified and protected in all parts of the case.    Once the hernia sac was taken down to approximately the umbilicus a Bard 3D Max mesh, size: Rachelle Hora, was  introduced into the preperitoneal space.  The mesh was brought over to cover the direct and indirect hernia spaces.  This was anchored into place and secured to Cooper's ligament with 4.88m staples from a Cov[place] hernia stapler. It was anchored to the anterior abdominal wall with 4.8 mm staples. The hernia sac was seen lying posterior to the mesh. There was no staples placed laterally. The insufflation was evacuated and the peritoneum was seen posterior to the mesh. The trochars were removed. The anterior fascia was reapproximated using #1 Vicryl on a UR- 6.  Intra-abdominal air was evacuated and the Veress needle removed. The skin was reapproximated using 4-0 Monocryl subcuticular fashion and Dermabond. The patient was awakened from general anesthesia and taken to recovery in stable condition.   PLAN OF CARE: Discharge to home after PACU  PATIENT DISPOSITION:  PACU - hemodynamically stable.   Delay start of Pharmacological VTE agent (>24hrs) due to surgical blood loss or risk of bleeding: not applicable

## 2021-10-01 NOTE — Interval H&P Note (Signed)
History and Physical Interval Note:  10/01/2021 7:16 AM  [name]  has presented today for surgery, with the diagnosis of RECURRENT RIGHT INGUINAL HERNIA.  The various methods of treatment have been discussed with the patient and family. After consideration of risks, benefits and other options for treatment, the patient has consented to  Procedure(s): LAPAROSCOPIC VS OPEN RIGHT INGUINAL HERNIA REPAIR WITH MESH (Right) as a surgical intervention.  The patient's history has been reviewed, patient examined, no change in status, stable for surgery.  I have reviewed the patient's chart and labs.  Questions were answered to the patient's satisfaction.     [name]

## 2021-10-02 ENCOUNTER — Encounter (HOSPITAL_COMMUNITY): Payer: Self-pay | Admitting: General Surgery

## 2021-10-06 ENCOUNTER — Other Ambulatory Visit: Payer: Self-pay | Admitting: Family Medicine

## 2021-10-06 DIAGNOSIS — I10 Essential (primary) hypertension: Secondary | ICD-10-CM

## 2021-10-22 ENCOUNTER — Telehealth: Payer: Self-pay

## 2021-10-22 ENCOUNTER — Ambulatory Visit: Payer: Medicare HMO

## 2021-10-22 NOTE — Telephone Encounter (Signed)
Called [facility]ient x 3 , no answer ,no vm , patient may reschedule for the next available appointment .  L.Stace Peace,Lpn

## 2021-10-23 NOTE — Telephone Encounter (Signed)
I called patient.  Patient said [employer] did AWV on 10/21/21. Patient said he didn't realize [facility] had called.

## 2021-11-05 ENCOUNTER — Encounter: Payer: Self-pay | Admitting: Family Medicine

## 2021-11-05 ENCOUNTER — Other Ambulatory Visit: Payer: Self-pay | Admitting: Family

## 2021-11-05 ENCOUNTER — Ambulatory Visit (INDEPENDENT_AMBULATORY_CARE_PROVIDER_SITE_OTHER): Payer: Medicare HMO | Admitting: Family Medicine

## 2021-11-05 VITALS — BP 160/102 | HR 58 | Temp 97.2°F | Ht 69.0 in | Wt 152.2 lb

## 2021-11-05 DIAGNOSIS — R1314 Dysphagia, pharyngoesophageal phase: Secondary | ICD-10-CM | POA: Diagnosis not present

## 2021-11-05 DIAGNOSIS — D61818 Other pancytopenia: Secondary | ICD-10-CM

## 2021-11-05 DIAGNOSIS — R634 Abnormal weight loss: Secondary | ICD-10-CM

## 2021-11-05 DIAGNOSIS — E440 Moderate protein-calorie malnutrition: Secondary | ICD-10-CM | POA: Insufficient documentation

## 2021-11-05 DIAGNOSIS — I10 Essential (primary) hypertension: Secondary | ICD-10-CM | POA: Diagnosis not present

## 2021-11-05 LAB — COMPREHENSIVE METABOLIC PANEL
ALT: 8 U/L (ref 0–53)
AST: 14 U/L (ref 0–37)
Albumin: 4.3 g/dL (ref 3.5–5.2)
Alkaline Phosphatase: 66 U/L (ref 39–117)
BUN: 14 mg/dL (ref 6–23)
CO2: 31 mEq/L (ref 19–32)
Calcium: 9.2 mg/dL (ref 8.4–10.5)
Chloride: 103 mEq/L (ref 96–112)
Creatinine, Ser: 0.97 mg/dL (ref 0.40–1.50)
GFR: 73.47 mL/min (ref >60.00)
Glucose, Bld: 88 mg/dL (ref 70–99)
Potassium: 3.8 mEq/L (ref 3.5–5.1)
Sodium: 140 mEq/L (ref 135–145)
Total Bilirubin: 0.8 mg/dL (ref 0.2–1.2)
Total Protein: 6.4 g/dL (ref 6.0–8.3)

## 2021-11-05 LAB — CBC WITH DIFFERENTIAL/PLATELET
Basophils Absolute: 0 10*3/uL (ref 0.0–0.1)
Basophils Relative: 1.2 % (ref 0.0–3.0)
Eosinophils Absolute: 0.1 10*3/uL (ref 0.0–0.7)
Eosinophils Relative: 2.3 % (ref 0.0–5.0)
HCT: 36.2 % — ABNORMAL LOW (ref 39.0–52.0)
Hemoglobin: 12.4 g/dL — ABNORMAL LOW (ref 13.0–17.0)
Lymphocytes Relative: 35.2 % (ref 12.0–46.0)
Lymphs Abs: 1 10*3/uL (ref 0.7–4.0)
MCHC: 34.2 g/dL (ref 30.0–36.0)
MCV: 97.4 fl (ref 78.0–100.0)
Monocytes Absolute: 0.3 10*3/uL (ref 0.1–1.0)
Monocytes Relative: 9.1 % (ref 3.0–12.0)
Neutro Abs: 1.5 10*3/uL (ref 1.4–7.7)
Neutrophils Relative %: 52.2 % (ref 43.0–77.0)
Platelets: 118 10*3/uL — ABNORMAL LOW (ref 150.0–400.0)
RBC: 3.72 Mil/uL — ABNORMAL LOW (ref 4.22–5.81)
RDW: 14.1 % (ref 11.5–15.5)
WBC: 3 10*3/uL — ABNORMAL LOW (ref 4.0–10.5)

## 2021-11-05 NOTE — Progress Notes (Signed)
Established Patient Office Visit  Subjective   Patient ID: [name], male    DOB: 03-22-1941  Age: 80 y.o. MRN: [id number]  Chief Complaint  Patient presents with   Office Visit    3 month f/u hypothyroid  No concerns today     HPI follow-up of hypertension, weight loss secondary to malnourishment dysphagia, and pancytopenia.  Blood pressure at home consistently runs in the 120s to 130/60-70 range.  We have confirmed the accuracy of his home cuff.  Not certain that he is actually taking HCTZ.  Continues to lose weight.  Continues to have trouble with dysphagia.  Status post revision of hernia repair last month.  Things seem to be going well.    Review of Systems  Constitutional:  Positive for weight loss. Negative for chills, fever and malaise/fatigue.  HENT: Negative.    Eyes:  Negative for blurred vision, discharge and redness.  Respiratory: Negative.    Cardiovascular: Negative.   Gastrointestinal:  Negative for abdominal pain, nausea and vomiting.  Genitourinary: Negative.   Musculoskeletal: Negative.  Negative for myalgias.  Skin:  Negative for rash.  Neurological:  Negative for tingling, loss of consciousness, weakness and headaches.  Endo/Heme/Allergies:  Negative for polydipsia.  Psychiatric/Behavioral: Negative.        Objective:     BP (!) 160/102 (BP Location: Right Arm, Patient Position: Sitting, Cuff Size: Small)   Pulse (!) 58   Temp (!) 97.2 F (36.2 C) (Temporal)   Ht '5\' 9"'$  (1.753 m)   Wt 152 lb 3.2 oz (69 kg)   SpO2 98%   BMI 22.48 kg/m  BP Readings from Last 3 Encounters:  11/05/21 (!) 160/102  10/01/21 (!) 171/88  09/23/21 (!) 171/89   Wt Readings from Last 3 Encounters:  11/05/21 152 lb 3.2 oz (69 kg)  10/01/21 154 lb (69.9 kg)  09/23/21 154 lb 3.2 oz (69.9 kg)      Physical Exam Constitutional:      General: He is not in acute distress.    Appearance: Normal appearance. He is underweight. He is not ill-appearing,  toxic-appearing or diaphoretic.  HENT:     Head: Normocephalic and atraumatic.     Right Ear: External ear normal.     Left Ear: External ear normal.     Mouth/Throat:     Mouth: Mucous membranes are moist.     Pharynx: Oropharynx is clear. No oropharyngeal exudate or posterior oropharyngeal erythema.  Eyes:     General: No scleral icterus.       Right eye: No discharge.        Left eye: No discharge.     Extraocular Movements: Extraocular movements intact.     Conjunctiva/sclera: Conjunctivae normal.     Pupils: Pupils are equal, round, and reactive to light.  Cardiovascular:     Rate and Rhythm: Normal rate and regular rhythm.  Pulmonary:     Effort: Pulmonary effort is normal. No respiratory distress.     Breath sounds: Normal breath sounds.  Abdominal:     General: Bowel sounds are normal.     Tenderness: There is no abdominal tenderness.  Musculoskeletal:     Cervical back: No rigidity or tenderness.  Skin:    General: Skin is warm and dry.  Neurological:     Mental Status: He is alert and oriented to person, place, and time.  Psychiatric:        Mood and Affect: Mood normal.  Behavior: Behavior normal.      No results found for any visits on 11/05/21.    The ASCVD Risk score (Arnett DK, et al., 2019) failed to calculate for the following reasons:   The 2019 ASCVD risk score is only valid for ages 71 to 26    Assessment & Plan:   Problem List Items Addressed This Visit       Cardiovascular and Mediastinum   White coat syndrome with diagnosis of hypertension     Digestive   Pharyngoesophageal dysphagia     Hematopoietic and Hemostatic   Pancytopenia ([place])   Relevant Orders   CBC w/Diff   Ambulatory referral to Hematology / Oncology     Other   Weight loss - Primary   Moderate protein-calorie malnutrition ([place])   Relevant Orders   Comprehensive metabolic panel   CBC w/Diff   Prealbumin    Return in about 3 months (around 02/05/2022), or  bring all meds with you next visit..  Believe that weight loss is related to malnourishment secondary to dysphagia.  We will continue supplementing with Ensure.  Pancytopenia probably secondary to malnourishment.  Will send to hematology for second opinion.  Confusion about his current blood pressure regimen.  Not sure that he is taking HCTZ but will go home and check.  [facility].  Libby Maw, MD

## 2021-11-06 LAB — PREALBUMIN: Prealbumin: 24 mg/dL (ref 21–43)

## 2021-11-07 ENCOUNTER — Encounter: Payer: Self-pay | Admitting: Family

## 2021-11-07 ENCOUNTER — Inpatient Hospital Stay (HOSPITAL_BASED_OUTPATIENT_CLINIC_OR_DEPARTMENT_OTHER): Payer: Medicare HMO | Admitting: Family

## 2021-11-07 ENCOUNTER — Telehealth: Payer: Self-pay

## 2021-11-07 ENCOUNTER — Inpatient Hospital Stay: Payer: Medicare HMO | Attending: Hematology & Oncology

## 2021-11-07 VITALS — BP 185/75 | HR 60 | Temp 98.1°F | Resp 17 | Wt 149.0 lb

## 2021-11-07 DIAGNOSIS — Z9221 Personal history of antineoplastic chemotherapy: Secondary | ICD-10-CM | POA: Insufficient documentation

## 2021-11-07 DIAGNOSIS — Z885 Allergy status to narcotic agent status: Secondary | ICD-10-CM

## 2021-11-07 DIAGNOSIS — E785 Hyperlipidemia, unspecified: Secondary | ICD-10-CM

## 2021-11-07 DIAGNOSIS — Z79899 Other long term (current) drug therapy: Secondary | ICD-10-CM | POA: Diagnosis not present

## 2021-11-07 DIAGNOSIS — E039 Hypothyroidism, unspecified: Secondary | ICD-10-CM

## 2021-11-07 DIAGNOSIS — R42 Dizziness and giddiness: Secondary | ICD-10-CM | POA: Diagnosis not present

## 2021-11-07 DIAGNOSIS — Z8501 Personal history of malignant neoplasm of esophagus: Secondary | ICD-10-CM

## 2021-11-07 DIAGNOSIS — Z87891 Personal history of nicotine dependence: Secondary | ICD-10-CM

## 2021-11-07 DIAGNOSIS — D61818 Other pancytopenia: Secondary | ICD-10-CM

## 2021-11-07 DIAGNOSIS — I10 Essential (primary) hypertension: Secondary | ICD-10-CM | POA: Diagnosis not present

## 2021-11-07 DIAGNOSIS — Z923 Personal history of irradiation: Secondary | ICD-10-CM

## 2021-11-07 DIAGNOSIS — Z79890 Hormone replacement therapy (postmenopausal): Secondary | ICD-10-CM | POA: Diagnosis not present

## 2021-11-07 DIAGNOSIS — Z888 Allergy status to other drugs, medicaments and biological substances status: Secondary | ICD-10-CM | POA: Diagnosis not present

## 2021-11-07 LAB — CMP (CANCER CENTER ONLY)
ALT: 7 U/L (ref 0–44)
AST: 13 U/L — ABNORMAL LOW (ref 15–41)
Albumin: 4.6 g/dL (ref 3.5–5.0)
Alkaline Phosphatase: 72 U/L (ref 38–126)
Anion gap: 6 (ref 5–15)
BUN: 13 mg/dL (ref 8–23)
CO2: 33 mmol/L — ABNORMAL HIGH (ref 22–32)
Calcium: 9.4 mg/dL (ref 8.9–10.3)
Chloride: 102 mmol/L (ref 98–111)
Creatinine: 1.06 mg/dL (ref 0.61–1.24)
GFR, Estimated: >60 mL/min (ref >60)
Glucose, Bld: 110 mg/dL — ABNORMAL HIGH (ref 70–99)
Potassium: 3.4 mmol/L — ABNORMAL LOW (ref 3.5–5.1)
Sodium: 141 mmol/L (ref 135–145)
Total Bilirubin: 1 mg/dL (ref 0.3–1.2)
Total Protein: 6.9 g/dL (ref 6.5–8.1)

## 2021-11-07 LAB — CBC WITH DIFFERENTIAL (CANCER CENTER ONLY)
Abs Immature Granulocytes: 0.13 10*3/uL — ABNORMAL HIGH (ref 0.00–0.07)
Basophils Absolute: 0 10*3/uL (ref 0.0–0.1)
Basophils Relative: 1 %
Eosinophils Absolute: 0.1 10*3/uL (ref 0.0–0.5)
Eosinophils Relative: 2 %
HCT: 38.2 % — ABNORMAL LOW (ref 39.0–52.0)
Hemoglobin: 13.1 g/dL (ref 13.0–17.0)
Immature Granulocytes: 4 %
Lymphocytes Relative: 28 %
Lymphs Abs: 0.9 10*3/uL (ref 0.7–4.0)
MCH: 33 pg (ref 26.0–34.0)
MCHC: 34.3 g/dL (ref 30.0–36.0)
MCV: 96.2 fL (ref 80.0–100.0)
Monocytes Absolute: 0.3 10*3/uL (ref 0.1–1.0)
Monocytes Relative: 9 %
Neutro Abs: 1.8 10*3/uL (ref 1.7–7.7)
Neutrophils Relative %: 56 %
Platelet Count: 134 10*3/uL — ABNORMAL LOW (ref 150–400)
RBC: 3.97 MIL/uL — ABNORMAL LOW (ref 4.22–5.81)
RDW: 12.9 % (ref 11.5–15.5)
WBC Count: 3.1 10*3/uL — ABNORMAL LOW (ref 4.0–10.5)
nRBC: 0 % (ref 0.0–0.2)

## 2021-11-07 LAB — LACTATE DEHYDROGENASE: LDH: 139 U/L (ref 98–192)

## 2021-11-07 LAB — SAVE SMEAR(SSMR), FOR PROVIDER SLIDE REVIEW

## 2021-11-07 NOTE — Telephone Encounter (Signed)
Pt viewed results on My Chart 

## 2021-11-07 NOTE — Telephone Encounter (Signed)
-----   Message from Libby Maw, MD sent at 11/07/2021  1:04 PM EDT ----- Lab work reflective of nutritional status is normal.   Blood counts continues slow decline.  Would like for you to see the hematologist or blood specialist and have ordered a consultation.

## 2021-11-07 NOTE — Progress Notes (Signed)
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  He is doing well and has no complaints at this time.  He has had no issue with infections.  No bleeding, bruising or petechiae.  WBC count 3.1, Hgb 13.1, MCV 96 and platelets 134.  No known family history of pancytopenia.  No c/o fatigue.  He was previously a smoker 1/2 ppd but quite 40 years ago. 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  He now has trouble swallowing secondary to the radiation and has lost a significant amount of weight. He had a hernia repair 3 weeks ago and will also be having a feeding tube placed sometime in the next month or so.  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 He is scheduled to have another one removed soon with [facility] dermatology.  No family history of cancer.  He has HTN and hypothyroidism affective treated on his current medication regimen. He notes some lightheadedness with his antihypertensives.  No history of diabetes.  No fever, chills, n/v, cough, rash, SOB, chest pain, palpitations, abdominal pain or changes in bowel or bladder habits.  No swelling, tenderness, numbness or tingling in his extremities.  No falls or syncope reported.  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  He served in the [employer]. 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 They called him "[employer]".  He enjoys being outside  working in his yard and spending time with his sweet family.   ROS: All other 10 point review of systems is negative.   PAST MEDICAL HISTORY:   Past Medical History:  Diagnosis Date   Anxiety state 08/05/2021   Arthritis    BPH (benign prostatic hyperplasia)    Bruises easily    Cancer (HCC)    throat   Complication of anesthesia    difficult urination after anesthesia   Dizziness    GERD (gastroesophageal reflux disease)    denies   Glaucoma    left worse than right   H/O hiatal hernia    Heart murmur    pt. reports  doctors can't hear it   Hyperlipemia    Hypertension    takes meds daily   Hypothyroidism    Transient alteration of awareness    ~ 12/27/17, evaluated by neurologist Dr. Felecia Shelling 01/06/18   Urinary retention     ALLERGIES: Allergies  Allergen Reactions   Travoprost Anaphylaxis    Toxicity   Amlodipine Other (See Comments)    Swollen gums     Lumigan [Bimatoprost] Other (See Comments)    Toxicity    Combigan [Brimonidine Tartrate-Timolol] Other (See Comments)    Toxicity   Codeine Nausea Only      MEDICATIONS:  Current Outpatient Medications on File Prior to Visit  Medication Sig Dispense Refill   dorzolamide-timolol (COSOPT) 22.3-6.8 MG/ML ophthalmic solution Place 1 drop into both  eyes 2 (two) times daily.     doxazosin (CARDURA) 2 MG tablet TAKE ONE TABLET BY MOUTH DAILY 90 tablet 1   escitalopram (LEXAPRO) 20 MG tablet Take 1 tablet (20 mg total) by mouth daily. 180 tablet 0   fluticasone (FLONASE) 50 MCG/ACT nasal spray Place 1 spray into both nostrils daily as needed for allergies or rhinitis.     latanoprost (XALATAN) 0.005 % ophthalmic solution Place 1 drop into both eyes at bedtime.     levothyroxine (SYNTHROID) 75 MCG tablet Take 1 tablet (75 mcg total) by mouth daily. 100 tablet 0   losartan (COZAAR) 100 MG tablet TAKE ONE TABLET BY MOUTH DAILY 100 tablet 3   simvastatin (ZOCOR) 20 MG tablet Take 1 tablet (20 mg total) by mouth every  evening. 90 tablet 3   traMADol (ULTRAM) 50 MG tablet Take 1 tablet (50 mg total) by mouth every 6 (six) hours as needed. (Patient not taking: Reported on 11/05/2021) 20 tablet 0   No current facility-administered medications on file prior to visit.     PAST SURGICAL HISTORY Past Surgical History:  Procedure Laterality Date   ANTERIOR LAT LUMBAR FUSION  03/11/2012   Procedure: ANTERIOR LATERAL LUMBAR FUSION 1 LEVEL;  Surgeon: Charlie Pitter, MD;  Location: [place] [facility];  Service: Neurosurgery;  Laterality: Left;  Left lumbar four-five extreme lumbar interbody fusion with percutaneous pedicle screws    BACK SURGERY     ruptured disc repair   COLONOSCOPY W/ POLYPECTOMY     EYE SURGERY     left cataract   HERNIA REPAIR     INGUINAL HERNIA REPAIR Right 05/22/2014   Procedure: REPAIR RECURRENT RIGHT INGUINAL HERNIA;  Surgeon: Doreen Salvage, MD;  Location: [place];  Service: General;  Laterality: Right;   INGUINAL HERNIA REPAIR Right 10/01/2021   Procedure: LAPAROSCOPIC INGUINAL HERNIA REPAIR;  Surgeon: [name], MD;  Location: [place];  Service: General;  Laterality: Right;   INSERTION OF MESH Right 05/22/2014   Procedure: INSERTION OF MESH;  Surgeon: Doreen Salvage, MD;  Location: [place];  Service: General;  Laterality: Right;   INSERTION OF MESH Left 08/06/2020   Procedure: INSERTION OF MESH;  Surgeon: [name], MD;  Location: [place];  Service: General;  Laterality: Left;   INSERTION OF MESH Right 10/01/2021   Procedure: INSERTION OF MESH;  Surgeon: [name], MD;  Location: [place];  Service: General;  Laterality: Right;   LUMBAR LAMINECTOMY/DECOMPRESSION MICRODISCECTOMY Left 01/11/2018   Procedure: Laminectomy and Foraminotomy - Left - Lumbar Three-Lumbar Four - Lumbar Five-Sacral One;  Surgeon: Earnie Larsson, MD;  Location: [place];  Service: Neurosurgery;  Laterality: Left;  Laminectomy and Foraminotomy - Left - Lumbar Three-Lumbar Four - Lumbar Five-Sacral One   LUMBAR PERCUTANEOUS PEDICLE  SCREW 1 LEVEL  03/11/2012   Procedure: LUMBAR PERCUTANEOUS PEDICLE SCREW 1 LEVEL;  Surgeon: Charlie Pitter, MD;  Location: [place] [facility];  Service: Neurosurgery;  Laterality: Left;  Left lumbar four-five extreme lumbar interbody fusion with percutaneous pedicle screws    RADICAL NECK DISSECTION  1989   ROTATOR CUFF REPAIR     right   TONSILLECTOMY     XI ROBOTIC ASSISTED VENTRAL HERNIA N/A 08/06/2020   Procedure: XI ROBOTIC ASSISTED INCISIONAL FLANK  HERNIA REPAIR WITH MESH;  Surgeon: [name], MD;  Location: [place];  Service: General;  Laterality: N/A;    FAMILY HISTORY: No family history on file.  SOCIAL HISTORY:  reports that he has quit smoking. He  has never used smokeless tobacco. He reports current alcohol use. He reports that he does not use drugs.  PERFORMANCE STATUS: The patient's performance status is 0 - Asymptomatic  PHYSICAL EXAM: Most Recent Vital Signs: There were no vitals taken for this visit. There were no vitals taken for this visit.  General Appearance:    Alert, cooperative, no distress, appears stated age  Head:    Normocephalic, without obvious abnormality, atraumatic  Eyes:    PERRL, conjunctiva/corneas clear, EOM's intact, fundi    benign, both eyes             Throat:   Lips, mucosa, and tongue normal; teeth and gums normal  Neck:   Supple, symmetrical, trachea midline, no adenopathy;       thyroid:  No enlargement/tenderness/nodules; no carotid   bruit or JVD  Back:     Symmetric, no curvature, ROM normal, no CVA tenderness  Lungs:     Clear to auscultation bilaterally, respirations unlabored  Chest wall:    No tenderness or deformity  Heart:    Regular rate and rhythm, S1 and S2 normal, no murmur, rub   or gallop  Abdomen:     Soft, non-tender, bowel sounds active all four quadrants,    no masses, no organomegaly        Extremities:   Extremities normal, atraumatic, no cyanosis or edema  Pulses:   2+ and symmetric all extremities  Skin:   Skin  color, texture, turgor normal, no rashes or lesions  Lymph nodes:   Cervical, supraclavicular, and axillary nodes normal  Neurologic:   CNII-XII intact. Normal strength, sensation and reflexes      throughout    LABORATORY DATA:  Results for orders placed or performed in visit on 11/07/21 (from the past 48 hour(s))  CBC with Differential ([place] Only)     Status: Abnormal (Preliminary result)   Collection Time: 11/07/21  8:33 AM  Result Value Ref Range   WBC Count 3.1 (L) 4.0 - 10.5 K/uL   RBC 3.97 (L) 4.22 - 5.81 MIL/uL   Hemoglobin 13.1 13.0 - 17.0 g/dL   HCT 38.2 (L) 39.0 - 52.0 %   MCV 96.2 80.0 - 100.0 fL   MCH 33.0 26.0 - 34.0 pg   MCHC 34.3 30.0 - 36.0 g/dL   RDW 12.9 11.5 - 15.5 %   Platelet Count 134 (L) 150 - 400 K/uL   nRBC 0.0 0.0 - 0.2 %    Comment: Performed at [facility] at [facility], [address], [place], [place] [address]   Neutrophils Relative % PENDING %   Neutro Abs PENDING 1.7 - 7.7 K/uL   Band Neutrophils PENDING %   Lymphocytes Relative PENDING %   Lymphs Abs PENDING 0.7 - 4.0 K/uL   Monocytes Relative PENDING %   Monocytes Absolute PENDING 0.1 - 1.0 K/uL   Eosinophils Relative PENDING %   Eosinophils Absolute PENDING 0.0 - 0.5 K/uL   Basophils Relative PENDING %   Basophils Absolute PENDING 0.0 - 0.1 K/uL   WBC Morphology PENDING    RBC Morphology PENDING    Smear Review PENDING    Other PENDING %   nRBC PENDING 0 /100 WBC   Metamyelocytes Relative PENDING %   Myelocytes PENDING %   Promyelocytes Relative PENDING %   Blasts PENDING %   Immature Granulocytes PENDING %   Abs Immature Granulocytes PENDING 0.00 - 0.07 K/uL  RADIOGRAPHY: No results found.     PATHOLOGY: None  ASSESSMENT/PLAN: Mr. 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  He has remained asymptomatic and has no complaints at this time.  His counts have  remained stable.  Blood smear and CBC with diff reviewed with Dr. Marin Olp. Cells appear well developed. No abnormality or evidence of malignancy noted.  At this time, we will release the patient back to PCP routine follow-up. No intervention needed.   All questions were answered. The patient knows to call the clinic with any problems, questions or concerns. We can certainly see him again for ant heme/onc issues that may arise. If counts decrease significantly.   The patient was discussed with Dr. Marin Olp and he is in agreement with the aforementioned.   Lottie Dawson, NP

## 2021-11-12 DIAGNOSIS — L570 Actinic keratosis: Secondary | ICD-10-CM | POA: Diagnosis not present

## 2021-11-12 DIAGNOSIS — C4442 Squamous cell carcinoma of skin of scalp and neck: Secondary | ICD-10-CM | POA: Diagnosis not present

## 2021-11-12 DIAGNOSIS — D485 Neoplasm of uncertain behavior of skin: Secondary | ICD-10-CM | POA: Diagnosis not present

## 2021-11-12 DIAGNOSIS — C44629 Squamous cell carcinoma of skin of left upper limb, including shoulder: Secondary | ICD-10-CM | POA: Diagnosis not present

## 2021-11-12 DIAGNOSIS — L820 Inflamed seborrheic keratosis: Secondary | ICD-10-CM | POA: Diagnosis not present

## 2021-11-26 ENCOUNTER — Other Ambulatory Visit: Payer: Self-pay | Admitting: Family Medicine

## 2021-11-26 DIAGNOSIS — E039 Hypothyroidism, unspecified: Secondary | ICD-10-CM

## 2021-12-04 DIAGNOSIS — D485 Neoplasm of uncertain behavior of skin: Secondary | ICD-10-CM | POA: Diagnosis not present

## 2021-12-04 DIAGNOSIS — L821 Other seborrheic keratosis: Secondary | ICD-10-CM | POA: Diagnosis not present

## 2021-12-04 DIAGNOSIS — L817 Pigmented purpuric dermatosis: Secondary | ICD-10-CM | POA: Diagnosis not present

## 2021-12-04 DIAGNOSIS — D0439 Carcinoma in situ of skin of other parts of face: Secondary | ICD-10-CM | POA: Diagnosis not present

## 2021-12-04 DIAGNOSIS — L565 Disseminated superficial actinic porokeratosis (DSAP): Secondary | ICD-10-CM | POA: Diagnosis not present

## 2021-12-04 DIAGNOSIS — Z85828 Personal history of other malignant neoplasm of skin: Secondary | ICD-10-CM | POA: Diagnosis not present

## 2021-12-04 DIAGNOSIS — C44629 Squamous cell carcinoma of skin of left upper limb, including shoulder: Secondary | ICD-10-CM | POA: Diagnosis not present

## 2021-12-04 DIAGNOSIS — C44319 Basal cell carcinoma of skin of other parts of face: Secondary | ICD-10-CM | POA: Diagnosis not present

## 2021-12-04 DIAGNOSIS — L570 Actinic keratosis: Secondary | ICD-10-CM | POA: Diagnosis not present

## 2021-12-29 ENCOUNTER — Other Ambulatory Visit: Payer: Self-pay

## 2021-12-29 DIAGNOSIS — J309 Allergic rhinitis, unspecified: Secondary | ICD-10-CM

## 2021-12-29 MED ORDER — IPRATROPIUM BROMIDE 0.03 % NA SOLN: 2.0000 | Freq: Two times a day (BID) | NASAL | 3 refills | Status: DC

## 2021-12-30 ENCOUNTER — Telehealth: Payer: Self-pay | Admitting: Family Medicine

## 2021-12-30 ENCOUNTER — Telehealth: Payer: Self-pay

## 2021-12-30 NOTE — Telephone Encounter (Signed)
Pt called to say he was waiting for his Awv and did not get the call.

## 2021-12-30 NOTE — Telephone Encounter (Signed)
Called patient x 3 , no answer a goolge system picks up and states the phone is not available at this time unable to leave a voice mail Patient may reschedule for next available appointment.  L.Wilson,LPN .

## 2022-01-06 NOTE — Telephone Encounter (Signed)
I spoke to patient about rescheduling AWV and he just had surgery. Patient said he didn't feel AWV was necessary right now.  Patient said I can call back next year to schedule AWV.

## 2022-02-05 ENCOUNTER — Ambulatory Visit (INDEPENDENT_AMBULATORY_CARE_PROVIDER_SITE_OTHER): Payer: Medicare HMO | Admitting: Family Medicine

## 2022-02-05 ENCOUNTER — Encounter: Payer: Self-pay | Admitting: Family Medicine

## 2022-02-05 VITALS — BP 143/90 | HR 60 | Temp 97.9°F | Ht 69.0 in | Wt 154.2 lb

## 2022-02-05 DIAGNOSIS — Z23 Encounter for immunization: Secondary | ICD-10-CM | POA: Insufficient documentation

## 2022-02-05 DIAGNOSIS — I10 Essential (primary) hypertension: Secondary | ICD-10-CM | POA: Diagnosis not present

## 2022-02-05 DIAGNOSIS — R1314 Dysphagia, pharyngoesophageal phase: Secondary | ICD-10-CM | POA: Diagnosis not present

## 2022-02-05 DIAGNOSIS — Z931 Gastrostomy status: Secondary | ICD-10-CM | POA: Diagnosis not present

## 2022-02-05 NOTE — Progress Notes (Signed)
Established Patient Office Visit  Subjective   Patient ID: [name], male    DOB: 06/24/1940  Age: 81 y.o. MRN: [id number]  Chief Complaint  Patient presents with   Follow-up    3 month follow not fasting pt states not taking HCTZ drops blood presser to low no concerns    HPI follow-up of hypertension and pharyngoesophageal dysphagia with associated weight loss and malnutrition.  Received a PEG tube 6 weeks ago.  Has been working well for him.  His weight is stabilized around 155.  Blood pressure at home has been running in the 120s over 70s.  If he takes the HCTZ it is run as low as 70/40.  Experiences lightheadedness.  He brings in his blood Pressure cuff this morning.  He is cuff registered a blood pressure of 156/89.    Review of Systems  Constitutional: Negative.   HENT: Negative.    Eyes:  Negative for blurred vision, discharge and redness.  Respiratory: Negative.    Cardiovascular: Negative.   Gastrointestinal:  Negative for abdominal pain.  Genitourinary: Negative.   Musculoskeletal: Negative.  Negative for myalgias.  Skin:  Negative for rash.  Neurological:  Negative for tingling, loss of consciousness and weakness.  Endo/Heme/Allergies:  Negative for polydipsia.      Objective:     BP (!) 160/90 (BP Location: Right Arm, Patient Position: Sitting, Cuff Size: Normal)   Pulse 60   Temp 97.9 F (36.6 C) (Temporal)   Ht '5\' 9"'$  (1.753 m)   Wt 154 lb 3.2 oz (69.9 kg)   SpO2 99%   BMI 22.77 kg/m  BP Readings from Last 3 Encounters:  02/05/22 (!) 160/90  11/07/21 (!) 185/75  11/05/21 (!) 160/102   Wt Readings from Last 3 Encounters:  02/05/22 154 lb 3.2 oz (69.9 kg)  11/07/21 149 lb (67.6 kg)  11/05/21 152 lb 3.2 oz (69 kg)      Physical Exam Constitutional:      General: He is not in acute distress.    Appearance: Normal appearance. He is not ill-appearing, toxic-appearing or diaphoretic.  HENT:     Head: Normocephalic and atraumatic.     Right  Ear: External ear normal.     Left Ear: External ear normal.  Eyes:     General: No scleral icterus.       Right eye: No discharge.        Left eye: No discharge.     Extraocular Movements: Extraocular movements intact.     Conjunctiva/sclera: Conjunctivae normal.  Cardiovascular:     Rate and Rhythm: Normal rate and regular rhythm.  Pulmonary:     Effort: Pulmonary effort is normal. No respiratory distress.     Breath sounds: Normal breath sounds.  Abdominal:     General: Bowel sounds are normal.  Musculoskeletal:     Cervical back: No rigidity or tenderness.  Skin:    General: Skin is warm and dry.  Neurological:     Mental Status: He is alert and oriented to person, place, and time.  Psychiatric:        Mood and Affect: Mood normal.        Behavior: Behavior normal.      No results found for any visits on 02/05/22.    The ASCVD Risk score (Arnett DK, et al., 2019) failed to calculate for the following reasons:   The 2019 ASCVD risk score is only valid for ages 15 to 12    Assessment &  Plan:   Problem List Items Addressed This Visit       Cardiovascular and Mediastinum   White coat syndrome with diagnosis of hypertension - Primary     Digestive   Pharyngoesophageal dysphagia     Other   Status post insertion of percutaneous endoscopic gastrostomy (PEG) tube ([place])   Need for influenza vaccination   Relevant Orders   Flu vaccine HIGH DOSE PF (Fluzone High dose)    Return in about 3 months (around 05/08/2022).   Have discontinued HCTZ.  Has documented whitecoat hypertension.  His home cuff compares favorably to our cuff. Libby Maw, MD

## 2022-02-23 ENCOUNTER — Other Ambulatory Visit: Payer: Self-pay | Admitting: Family Medicine

## 2022-02-23 DIAGNOSIS — E039 Hypothyroidism, unspecified: Secondary | ICD-10-CM

## 2022-02-26 DIAGNOSIS — R1319 Other dysphagia: Secondary | ICD-10-CM | POA: Diagnosis not present

## 2022-02-26 DIAGNOSIS — H903 Sensorineural hearing loss, bilateral: Secondary | ICD-10-CM | POA: Diagnosis not present

## 2022-02-26 DIAGNOSIS — H60542 Acute eczematoid otitis externa, left ear: Secondary | ICD-10-CM | POA: Diagnosis not present

## 2022-03-16 ENCOUNTER — Encounter: Payer: Self-pay | Admitting: Family Medicine

## 2022-03-20 DIAGNOSIS — H401132 Primary open-angle glaucoma, bilateral, moderate stage: Secondary | ICD-10-CM | POA: Diagnosis not present

## 2022-03-20 DIAGNOSIS — Z961 Presence of intraocular lens: Secondary | ICD-10-CM | POA: Diagnosis not present

## 2022-03-20 DIAGNOSIS — H5213 Myopia, bilateral: Secondary | ICD-10-CM | POA: Diagnosis not present

## 2022-03-20 DIAGNOSIS — H43813 Vitreous degeneration, bilateral: Secondary | ICD-10-CM | POA: Diagnosis not present

## 2022-03-20 DIAGNOSIS — H04123 Dry eye syndrome of bilateral lacrimal glands: Secondary | ICD-10-CM | POA: Diagnosis not present

## 2022-03-23 DIAGNOSIS — H524 Presbyopia: Secondary | ICD-10-CM | POA: Diagnosis not present

## 2022-03-23 DIAGNOSIS — H52223 Regular astigmatism, bilateral: Secondary | ICD-10-CM | POA: Diagnosis not present

## 2022-03-25 ENCOUNTER — Other Ambulatory Visit: Payer: Self-pay | Admitting: Family Medicine

## 2022-03-25 ENCOUNTER — Telehealth: Payer: Self-pay | Admitting: Family Medicine

## 2022-03-25 DIAGNOSIS — F411 Generalized anxiety disorder: Secondary | ICD-10-CM

## 2022-03-25 MED ORDER — ESCITALOPRAM OXALATE 20 MG PO TABS: 20.0000 mg | ORAL_TABLET | Freq: Every day | ORAL | 0 refills | Status: DC

## 2022-03-25 NOTE — Telephone Encounter (Signed)
Caller Name: pt Call back phone #: [phone]   MEDICATION(S):  escitalopram (LEXAPRO) 20 MG tablet [[id number]]   Days of Med Remaining:   Has the patient contacted their pharmacy (YES/NO)? no What did pharmacy advise?   Preferred Pharmacy:  [facility], [place] Phone: [phone]      ~~~Please advise patient/caregiver to allow 2-3 business days to process RX refills.

## 2022-04-03 ENCOUNTER — Other Ambulatory Visit: Payer: Self-pay | Admitting: Family Medicine

## 2022-04-03 DIAGNOSIS — I10 Essential (primary) hypertension: Secondary | ICD-10-CM

## 2022-04-07 DIAGNOSIS — L821 Other seborrheic keratosis: Secondary | ICD-10-CM | POA: Diagnosis not present

## 2022-04-07 DIAGNOSIS — Z85828 Personal history of other malignant neoplasm of skin: Secondary | ICD-10-CM | POA: Diagnosis not present

## 2022-04-07 DIAGNOSIS — D485 Neoplasm of uncertain behavior of skin: Secondary | ICD-10-CM | POA: Diagnosis not present

## 2022-04-07 DIAGNOSIS — D225 Melanocytic nevi of trunk: Secondary | ICD-10-CM | POA: Diagnosis not present

## 2022-04-07 DIAGNOSIS — L110 Acquired keratosis follicularis: Secondary | ICD-10-CM | POA: Diagnosis not present

## 2022-04-07 DIAGNOSIS — L570 Actinic keratosis: Secondary | ICD-10-CM | POA: Diagnosis not present

## 2022-05-08 ENCOUNTER — Ambulatory Visit (INDEPENDENT_AMBULATORY_CARE_PROVIDER_SITE_OTHER): Payer: Medicare HMO | Admitting: Family Medicine

## 2022-05-08 ENCOUNTER — Encounter: Payer: Self-pay | Admitting: Family Medicine

## 2022-05-08 VITALS — BP 140/98 | HR 63 | Temp 98.0°F | Ht 69.0 in | Wt 157.8 lb

## 2022-05-08 DIAGNOSIS — Z23 Encounter for immunization: Secondary | ICD-10-CM

## 2022-05-08 DIAGNOSIS — I10 Essential (primary) hypertension: Secondary | ICD-10-CM | POA: Diagnosis not present

## 2022-05-08 DIAGNOSIS — E440 Moderate protein-calorie malnutrition: Secondary | ICD-10-CM | POA: Diagnosis not present

## 2022-05-08 DIAGNOSIS — E876 Hypokalemia: Secondary | ICD-10-CM | POA: Diagnosis not present

## 2022-05-08 LAB — COMPREHENSIVE METABOLIC PANEL
ALT: 9 U/L (ref 0–53)
AST: 13 U/L (ref 0–37)
Albumin: 4.3 g/dL (ref 3.5–5.2)
Alkaline Phosphatase: 75 U/L (ref 39–117)
BUN: 17 mg/dL (ref 6–23)
CO2: 33 mEq/L — ABNORMAL HIGH (ref 19–32)
Calcium: 9.1 mg/dL (ref 8.4–10.5)
Chloride: 102 mEq/L (ref 96–112)
Creatinine, Ser: 0.88 mg/dL (ref 0.40–1.50)
GFR: 80.64 mL/min (ref >60.00)
Glucose, Bld: 74 mg/dL (ref 70–99)
Potassium: 3.5 mEq/L (ref 3.5–5.1)
Sodium: 140 mEq/L (ref 135–145)
Total Bilirubin: 0.9 mg/dL (ref 0.2–1.2)
Total Protein: 6.5 g/dL (ref 6.0–8.3)

## 2022-05-08 NOTE — Progress Notes (Signed)
Established Patient Office Visit   Subjective:  Patient ID: [name], male    DOB: 10-31-1940  Age: 82 y.o. MRN: [id number]  Chief Complaint  Patient presents with   Follow-up    2mof/u. Not fasting (has had enteric feeding this am). No concerns.    HPI Encounter Diagnoses  Name Primary?   White coat syndrome with diagnosis of hypertension Yes   Need for Tdap vaccination    Hypokalemia    Follow-up of hypertension, malnutrition and hypokalemia.  He is taking no medications known to deplete potassium.  History of whitecoat hypertension.  We have calibrated his machine.  Blood pressures at home have been consistently running in the 130s over 80s.  He is gaining weight with his PEG tube.  Continues with oral Ensure.  He is having trouble with placement of his tube.   Review of Systems  Constitutional: Negative.  Negative for weight loss.  HENT: Negative.    Eyes:  Negative for blurred vision, discharge and redness.  Respiratory: Negative.    Cardiovascular: Negative.   Gastrointestinal:  Negative for abdominal pain.  Genitourinary: Negative.   Musculoskeletal: Negative.  Negative for myalgias.  Skin:  Negative for rash.  Neurological:  Negative for tingling, loss of consciousness and weakness.  Endo/Heme/Allergies:  Negative for polydipsia.     Current Outpatient Medications:    dorzolamide-timolol (COSOPT) 22.3-6.8 MG/ML ophthalmic solution, Place 1 drop into both eyes 2 (two) times daily., Disp: , Rfl:    doxazosin (CARDURA) 2 MG tablet, TAKE 1 TABLET BY MOUTH DAILY, Disp: 90 tablet, Rfl: 1   escitalopram (LEXAPRO) 20 MG tablet, Take 1 tablet (20 mg total) by mouth daily., Disp: 180 tablet, Rfl: 0   fluticasone (FLONASE) 50 MCG/ACT nasal spray, Place 1 spray into both nostrils daily as needed for allergies or rhinitis., Disp: , Rfl:    ipratropium (ATROVENT) 0.03 % nasal spray, Place 2 sprays into both nostrils every 12 (twelve) hours., Disp: 30 mL, Rfl: 3    latanoprost (XALATAN) 0.005 % ophthalmic solution, Place 1 drop into both eyes at bedtime., Disp: , Rfl:    levothyroxine (SYNTHROID) 75 MCG tablet, TAKE 1 TABLET BY MOUTH DAILY, Disp: 90 tablet, Rfl: 0   losartan (COZAAR) 100 MG tablet, TAKE ONE TABLET BY MOUTH DAILY, Disp: 100 tablet, Rfl: 3   simvastatin (ZOCOR) 20 MG tablet, Take 1 tablet (20 mg total) by mouth every evening., Disp: 90 tablet, Rfl: 3   Objective:     BP (!) 140/98 (BP Location: Right Arm, Patient Position: Sitting)   Pulse 63   Temp 98 F (36.7 C) (Oral)   Ht '5\' 9"'$  (1.753 m)   Wt 157 lb 12.8 oz (71.6 kg)   SpO2 99%   BMI 23.30 kg/m  Wt Readings from Last 3 Encounters:  05/08/22 157 lb 12.8 oz (71.6 kg)  02/05/22 154 lb 3.2 oz (69.9 kg)  11/07/21 149 lb (67.6 kg)      Physical Exam Constitutional:      General: He is not in acute distress.    Appearance: Normal appearance. He is not ill-appearing, toxic-appearing or diaphoretic.  HENT:     Head: Normocephalic and atraumatic.     Right Ear: External ear normal.     Left Ear: External ear normal.     Mouth/Throat:     Mouth: Mucous membranes are moist.     Pharynx: Oropharynx is clear. No oropharyngeal exudate or posterior oropharyngeal erythema.  Eyes:  General: No scleral icterus.       Right eye: No discharge.        Left eye: No discharge.     Extraocular Movements: Extraocular movements intact.     Conjunctiva/sclera: Conjunctivae normal.     Pupils: Pupils are equal, round, and reactive to light.  Cardiovascular:     Rate and Rhythm: Normal rate and regular rhythm.  Pulmonary:     Effort: Pulmonary effort is normal. No respiratory distress.  Abdominal:     General: Bowel sounds are normal.  Skin:    General: Skin is warm and dry.  Neurological:     Mental Status: He is alert and oriented to person, place, and time.  Psychiatric:        Mood and Affect: Mood normal.        Behavior: Behavior normal.      No results found for any  visits on 05/08/22.    The ASCVD Risk score (Arnett DK, et al., 2019) failed to calculate for the following reasons:   The 2019 ASCVD risk score is only valid for ages 39 to 27    Assessment & Plan:   White coat syndrome with diagnosis of hypertension -     Comprehensive metabolic panel  Need for Tdap vaccination -     Tdap vaccine greater than or equal to 7yo IM  Hypokalemia -     Comprehensive metabolic panel    Return in about 6 months (around 11/06/2022), or if symptoms worsen or fail to improve.  Continues to gain weight, albeit slowly status post PEG placement.  Has follow-up with general surgery next week for PEG tube check.  Continue supplementing with Ensure.  Rechecking potassium.  Libby Maw, MD

## 2022-05-13 ENCOUNTER — Other Ambulatory Visit: Payer: Self-pay | Admitting: Family Medicine

## 2022-05-13 DIAGNOSIS — E785 Hyperlipidemia, unspecified: Secondary | ICD-10-CM

## 2022-05-20 DIAGNOSIS — I10 Essential (primary) hypertension: Secondary | ICD-10-CM | POA: Diagnosis not present

## 2022-05-20 DIAGNOSIS — H9193 Unspecified hearing loss, bilateral: Secondary | ICD-10-CM | POA: Diagnosis not present

## 2022-05-20 DIAGNOSIS — Z85828 Personal history of other malignant neoplasm of skin: Secondary | ICD-10-CM | POA: Diagnosis not present

## 2022-05-20 DIAGNOSIS — E039 Hypothyroidism, unspecified: Secondary | ICD-10-CM | POA: Diagnosis not present

## 2022-05-20 DIAGNOSIS — H259 Unspecified age-related cataract: Secondary | ICD-10-CM | POA: Diagnosis not present

## 2022-05-20 DIAGNOSIS — E785 Hyperlipidemia, unspecified: Secondary | ICD-10-CM | POA: Diagnosis not present

## 2022-05-20 DIAGNOSIS — J302 Other seasonal allergic rhinitis: Secondary | ICD-10-CM | POA: Diagnosis not present

## 2022-05-20 DIAGNOSIS — Z8249 Family history of ischemic heart disease and other diseases of the circulatory system: Secondary | ICD-10-CM | POA: Diagnosis not present

## 2022-05-20 DIAGNOSIS — Z87891 Personal history of nicotine dependence: Secondary | ICD-10-CM | POA: Diagnosis not present

## 2022-05-20 DIAGNOSIS — Z888 Allergy status to other drugs, medicaments and biological substances status: Secondary | ICD-10-CM | POA: Diagnosis not present

## 2022-05-20 DIAGNOSIS — E46 Unspecified protein-calorie malnutrition: Secondary | ICD-10-CM | POA: Diagnosis not present

## 2022-05-20 DIAGNOSIS — H409 Unspecified glaucoma: Secondary | ICD-10-CM | POA: Diagnosis not present

## 2022-05-25 ENCOUNTER — Other Ambulatory Visit: Payer: Self-pay | Admitting: Family Medicine

## 2022-05-25 DIAGNOSIS — E039 Hypothyroidism, unspecified: Secondary | ICD-10-CM

## 2022-06-04 ENCOUNTER — Telehealth: Payer: Self-pay | Admitting: Family Medicine

## 2022-06-04 NOTE — Telephone Encounter (Signed)
Spoke with patient to  schedule Medicare Annual Wellness Visit (AWV) either virtually or in office. my Herbie Drape number [phone]   Patient stated Blair Hailey came to his home jan 2024.  I will check with amy to see if we can still do AWV since Insurance did awv in Jan 2024.   Patient is aware I will call him back if we can do AWV.  Last AWV5/14/20 please schedule with Nurse Health Adviser   45 min for awv-i  in office appointments 30 min for awv-s & awv-i phone/virtual appointments

## 2022-07-21 ENCOUNTER — Encounter: Payer: Self-pay | Admitting: Family Medicine

## 2022-07-21 ENCOUNTER — Ambulatory Visit (INDEPENDENT_AMBULATORY_CARE_PROVIDER_SITE_OTHER): Payer: Medicare HMO | Admitting: Family Medicine

## 2022-07-21 VITALS — BP 162/88 | HR 58 | Temp 98.2°F | Ht 69.0 in | Wt 157.2 lb

## 2022-07-21 DIAGNOSIS — L299 Pruritus, unspecified: Secondary | ICD-10-CM

## 2022-07-21 DIAGNOSIS — I10 Essential (primary) hypertension: Secondary | ICD-10-CM | POA: Diagnosis not present

## 2022-07-21 NOTE — Progress Notes (Signed)
Established Patient Office Visit   Subjective:  Patient ID: [name], male    DOB: 04-22-1941  Age: 82 y.o. MRN: [id number]  Chief Complaint  Patient presents with  . Allergic Reaction    Patient feels like Synthroid is causing body to be very itchy all over symptoms x 1 year. Seem dermatologist but was told this was not skin related.      Allergic Reaction Associated symptoms include itching. Pertinent negatives include no abdominal pain, eye redness or rash.   Encounter Diagnoses  Name Primary?  . Pruritus Yes  . White coat syndrome with diagnosis of hypertension    Ongoing general pruritus about his body without rash.  He does have dry skin but uses gentle soaps and moisturizes.  These measures have not helped.  No recent changes in medications.  Again there has been no rash.  Blood pressure at home continues to run in the 130s over 80s.  His BP cuff has been calibrated.   Review of Systems  Constitutional: Negative.   HENT: Negative.    Eyes:  Negative for blurred vision, discharge and redness.  Respiratory: Negative.    Cardiovascular: Negative.   Gastrointestinal:  Negative for abdominal pain.  Genitourinary: Negative.   Musculoskeletal: Negative.  Negative for myalgias.  Skin:  Positive for itching. Negative for rash.  Neurological:  Negative for tingling, loss of consciousness and weakness.  Endo/Heme/Allergies:  Negative for polydipsia.     Current Outpatient Medications:  .  dorzolamide-timolol (COSOPT) 22.3-6.8 MG/ML ophthalmic solution, Place 1 drop into both eyes 2 (two) times daily., Disp: , Rfl:  .  doxazosin (CARDURA) 2 MG tablet, TAKE 1 TABLET BY MOUTH DAILY, Disp: 90 tablet, Rfl: 1 .  escitalopram (LEXAPRO) 20 MG tablet, Take 1 tablet (20 mg total) by mouth daily., Disp: 180 tablet, Rfl: 0 .  fluticasone (FLONASE) 50 MCG/ACT nasal spray, Place 1 spray into both nostrils daily as needed for allergies or rhinitis., Disp: , Rfl:  .  ipratropium  (ATROVENT) 0.03 % nasal spray, Place 2 sprays into both nostrils every 12 (twelve) hours., Disp: 30 mL, Rfl: 3 .  latanoprost (XALATAN) 0.005 % ophthalmic solution, Place 1 drop into both eyes at bedtime., Disp: , Rfl:  .  levothyroxine (SYNTHROID) 75 MCG tablet, TAKE 1 TABLET BY MOUTH DAILY, Disp: 90 tablet, Rfl: 0 .  losartan (COZAAR) 100 MG tablet, TAKE ONE TABLET BY MOUTH DAILY, Disp: 100 tablet, Rfl: 3 .  simvastatin (ZOCOR) 20 MG tablet, TAKE ONE TABLET BY MOUTH EVERY EVENING, Disp: 90 tablet, Rfl: 3   Objective:     BP (!) 162/88 (BP Location: Left Arm, Patient Position: Sitting, Cuff Size: Normal)   Pulse (!) 58   Temp 98.2 F (36.8 C) (Temporal)   Ht 5\' 9"  (1.753 m)   Wt 157 lb 3.2 oz (71.3 kg)   SpO2 98%   BMI 23.21 kg/m  BP Readings from Last 3 Encounters:  07/21/22 (!) 162/88  05/08/22 (!) 140/98  02/05/22 (!) 143/90   Wt Readings from Last 3 Encounters:  07/21/22 157 lb 3.2 oz (71.3 kg)  05/08/22 157 lb 12.8 oz (71.6 kg)  02/05/22 154 lb 3.2 oz (69.9 kg)      Physical Exam Constitutional:      General: He is not in acute distress.    Appearance: Normal appearance. He is not ill-appearing, toxic-appearing or diaphoretic.  HENT:     Head: Normocephalic and atraumatic.     Right Ear: External ear  normal.     Left Ear: External ear normal.  Eyes:     General: No scleral icterus.       Right eye: No discharge.        Left eye: No discharge.     Extraocular Movements: Extraocular movements intact.     Conjunctiva/sclera: Conjunctivae normal.  Pulmonary:     Effort: Pulmonary effort is normal. No respiratory distress.  Skin:    General: Skin is warm and dry.  Neurological:     Mental Status: He is alert and oriented to person, place, and time.  Psychiatric:        Mood and Affect: Mood normal.        Behavior: Behavior normal.     No results found for any visits on 07/21/22.    The ASCVD Risk score (Arnett DK, et al., 2019) failed to calculate for the  following reasons:   The 2019 ASCVD risk score is only valid for ages 19 to 77    Assessment & Plan:   Pruritus  White coat syndrome with diagnosis of hypertension    Return Intentionally change levothyroxine manufacturers and follow-up 2 months later.Libby Maw, MD

## 2022-08-12 DIAGNOSIS — D1801 Hemangioma of skin and subcutaneous tissue: Secondary | ICD-10-CM | POA: Diagnosis not present

## 2022-08-12 DIAGNOSIS — L821 Other seborrheic keratosis: Secondary | ICD-10-CM | POA: Diagnosis not present

## 2022-08-12 DIAGNOSIS — Z85828 Personal history of other malignant neoplasm of skin: Secondary | ICD-10-CM | POA: Diagnosis not present

## 2022-08-12 DIAGNOSIS — D692 Other nonthrombocytopenic purpura: Secondary | ICD-10-CM | POA: Diagnosis not present

## 2022-08-12 DIAGNOSIS — L570 Actinic keratosis: Secondary | ICD-10-CM | POA: Diagnosis not present

## 2022-09-01 ENCOUNTER — Other Ambulatory Visit: Payer: Self-pay | Admitting: Family Medicine

## 2022-09-01 DIAGNOSIS — E039 Hypothyroidism, unspecified: Secondary | ICD-10-CM

## 2022-09-14 ENCOUNTER — Telehealth: Payer: Self-pay | Admitting: Family Medicine

## 2022-09-14 DIAGNOSIS — F411 Generalized anxiety disorder: Secondary | ICD-10-CM

## 2022-09-14 MED ORDER — ESCITALOPRAM OXALATE 20 MG PO TABS: 20.0000 mg | ORAL_TABLET | Freq: Every day | ORAL | 0 refills | Status: DC

## 2022-09-14 NOTE — Telephone Encounter (Signed)
Pt needs his Lexapro refilled he is 4 days without.

## 2022-09-22 ENCOUNTER — Encounter: Payer: Self-pay | Admitting: Family Medicine

## 2022-09-22 ENCOUNTER — Ambulatory Visit (INDEPENDENT_AMBULATORY_CARE_PROVIDER_SITE_OTHER): Payer: Medicare HMO | Admitting: Family Medicine

## 2022-09-22 VITALS — BP 154/76 | HR 57 | Temp 97.8°F | Ht 69.0 in | Wt 154.0 lb

## 2022-09-22 DIAGNOSIS — I10 Essential (primary) hypertension: Secondary | ICD-10-CM

## 2022-09-22 DIAGNOSIS — E039 Hypothyroidism, unspecified: Secondary | ICD-10-CM | POA: Diagnosis not present

## 2022-09-22 DIAGNOSIS — E785 Hyperlipidemia, unspecified: Secondary | ICD-10-CM

## 2022-09-22 NOTE — Progress Notes (Signed)
Established Patient Office Visit   Subjective:  Patient ID: [name], male    DOB: 06/09/1940  Age: 82 y.o. MRN: [id number]  Chief Complaint  Patient presents with   Medical Management of Chronic Issues    3 month follow on medication and itchiness. Per patient not able to purchase different meds due to increase in the price.     HPI Encounter Diagnoses  Name Primary?   White coat syndrome with diagnosis of hypertension Yes   Hyperlipidemia, unspecified hyperlipidemia type    Hypothyroidism, unspecified type    For follow-up of the above.  Blood pressure at home has been running in the 130s over 70s.  Unable to afford the trade levothyroxine.  Continues with generic.  Continues to feel a crawling sensation across his skin at times it feels most like an insect.  No insect is ever found.  Continues with 20 mg of simvastatin.  Nonfasting today.   Review of Systems  Constitutional: Negative.   HENT: Negative.    Eyes:  Negative for blurred vision, discharge and redness.  Respiratory: Negative.    Cardiovascular: Negative.   Gastrointestinal:  Negative for abdominal pain.  Genitourinary: Negative.   Musculoskeletal: Negative.  Negative for myalgias.  Skin:  Negative for rash.  Neurological:  Negative for tingling, loss of consciousness and weakness.  Endo/Heme/Allergies:  Negative for polydipsia.     Current Outpatient Medications:    dorzolamide-timolol (COSOPT) 22.3-6.8 MG/ML ophthalmic solution, Place 1 drop into both eyes 2 (two) times daily., Disp: , Rfl:    doxazosin (CARDURA) 2 MG tablet, TAKE 1 TABLET BY MOUTH DAILY, Disp: 90 tablet, Rfl: 1   escitalopram (LEXAPRO) 20 MG tablet, Take 1 tablet (20 mg total) by mouth daily., Disp: 180 tablet, Rfl: 0   fluticasone (FLONASE) 50 MCG/ACT nasal spray, Place 1 spray into both nostrils daily as needed for allergies or rhinitis., Disp: , Rfl:    latanoprost (XALATAN) 0.005 % ophthalmic solution, Place 1 drop into both eyes  at bedtime., Disp: , Rfl:    levothyroxine (SYNTHROID) 75 MCG tablet, TAKE 1 TABLET BY MOUTH DAILY, Disp: 90 tablet, Rfl: 0   losartan (COZAAR) 100 MG tablet, TAKE ONE TABLET BY MOUTH DAILY, Disp: 100 tablet, Rfl: 3   Nutritional Supplements (ENSURE ACTIVE PO), TAKE 3 TO 4 CARTONS ANY FLAVOR BY MOUTH DAILY, Disp: , Rfl:    simvastatin (ZOCOR) 20 MG tablet, TAKE ONE TABLET BY MOUTH EVERY EVENING, Disp: 90 tablet, Rfl: 3   ipratropium (ATROVENT) 0.03 % nasal spray, Place 2 sprays into both nostrils every 12 (twelve) hours. (Patient not taking: Reported on 09/22/2022), Disp: 30 mL, Rfl: 3   Objective:     BP (!) 154/76 (BP Location: Right Arm, Patient Position: Sitting, Cuff Size: Normal)   Pulse (!) 57   Temp 97.8 F (36.6 C) (Temporal)   Ht 5\' 9"  (1.753 m)   Wt 154 lb (69.9 kg)   SpO2 98%   BMI 22.74 kg/m    Physical Exam Constitutional:      General: He is not in acute distress.    Appearance: Normal appearance. He is not ill-appearing, toxic-appearing or diaphoretic.  HENT:     Head: Normocephalic and atraumatic.     Right Ear: External ear normal.     Left Ear: External ear normal.  Eyes:     General: No scleral icterus.       Right eye: No discharge.        Left eye:  No discharge.     Extraocular Movements: Extraocular movements intact.     Conjunctiva/sclera: Conjunctivae normal.  Pulmonary:     Effort: Pulmonary effort is normal. No respiratory distress.  Skin:    General: Skin is warm and dry.     Findings: No rash.  Neurological:     Mental Status: He is alert and oriented to person, place, and time.  Psychiatric:        Mood and Affect: Mood normal.        Behavior: Behavior normal.      No results found for any visits on 09/22/22.    The ASCVD Risk score (Arnett DK, et al., 2019) failed to calculate for the following reasons:   The 2019 ASCVD risk score is only valid for ages 98 to 63    Assessment & Plan:   White coat syndrome with diagnosis of  hypertension -     Basic metabolic panel -     Urinalysis, Routine w reflex microscopic  Hyperlipidemia, unspecified hyperlipidemia type -     Lipid panel  Hypothyroidism, unspecified type -     TSH    Return in about 6 months (around 03/25/2023).  Could consider neuropathy for pruritus/dysesthesias.  Discussed using gabapentin if needed.  Mliss Sax, MD

## 2022-09-23 LAB — BASIC METABOLIC PANEL
BUN: 16 mg/dL (ref 6–23)
CO2: 31 mEq/L (ref 19–32)
Calcium: 8.9 mg/dL (ref 8.4–10.5)
Chloride: 105 mEq/L (ref 96–112)
Creatinine, Ser: 1.05 mg/dL (ref 0.40–1.50)
GFR: 66.39 mL/min (ref >60.00)
Glucose, Bld: 102 mg/dL — ABNORMAL HIGH (ref 70–99)
Potassium: 3.4 mEq/L — ABNORMAL LOW (ref 3.5–5.1)
Sodium: 143 mEq/L (ref 135–145)

## 2022-09-23 LAB — LIPID PANEL
Cholesterol: 98 mg/dL (ref 0–200)
HDL: 54 mg/dL (ref >39.00)
LDL Cholesterol: 30 mg/dL (ref 0–99)
NonHDL: 43.79
Total CHOL/HDL Ratio: 2
Triglycerides: 68 mg/dL (ref 0.0–149.0)
VLDL: 13.6 mg/dL (ref 0.0–40.0)

## 2022-09-23 LAB — URINALYSIS, ROUTINE W REFLEX MICROSCOPIC
Bilirubin Urine: NEGATIVE
Hgb urine dipstick: NEGATIVE
Ketones, ur: NEGATIVE
Leukocytes,Ua: NEGATIVE
Nitrite: NEGATIVE
RBC / HPF: NONE SEEN (ref 0–?)
Specific Gravity, Urine: 1.015 (ref 1.000–1.030)
Total Protein, Urine: NEGATIVE
Urine Glucose: NEGATIVE
Urobilinogen, UA: 1 (ref 0.0–1.0)
pH: 6.5 (ref 5.0–8.0)

## 2022-09-23 LAB — TSH: TSH: 1.09 u[IU]/mL (ref 0.35–5.50)

## 2022-09-24 DIAGNOSIS — Z961 Presence of intraocular lens: Secondary | ICD-10-CM | POA: Diagnosis not present

## 2022-09-24 DIAGNOSIS — H401132 Primary open-angle glaucoma, bilateral, moderate stage: Secondary | ICD-10-CM | POA: Diagnosis not present

## 2022-09-28 ENCOUNTER — Other Ambulatory Visit: Payer: Self-pay | Admitting: Family Medicine

## 2022-09-28 DIAGNOSIS — I10 Essential (primary) hypertension: Secondary | ICD-10-CM

## 2022-11-06 ENCOUNTER — Ambulatory Visit: Payer: Medicare HMO | Admitting: Family Medicine

## 2022-11-06 ENCOUNTER — Telehealth: Payer: Self-pay | Admitting: Family Medicine

## 2022-11-06 NOTE — Telephone Encounter (Signed)
Ns on 7/5 no reason available letter sent

## 2022-11-13 NOTE — Telephone Encounter (Signed)
Error, pt called 7/2 and appt was supposed to be cancelled

## 2022-12-01 ENCOUNTER — Other Ambulatory Visit: Payer: Self-pay | Admitting: Family Medicine

## 2022-12-01 DIAGNOSIS — E039 Hypothyroidism, unspecified: Secondary | ICD-10-CM

## 2022-12-09 DIAGNOSIS — L600 Ingrowing nail: Secondary | ICD-10-CM | POA: Diagnosis not present

## 2022-12-09 DIAGNOSIS — M79675 Pain in left toe(s): Secondary | ICD-10-CM | POA: Diagnosis not present

## 2022-12-09 DIAGNOSIS — M79674 Pain in right toe(s): Secondary | ICD-10-CM | POA: Diagnosis not present

## 2022-12-15 DIAGNOSIS — D0461 Carcinoma in situ of skin of right upper limb, including shoulder: Secondary | ICD-10-CM | POA: Diagnosis not present

## 2022-12-15 DIAGNOSIS — D485 Neoplasm of uncertain behavior of skin: Secondary | ICD-10-CM | POA: Diagnosis not present

## 2022-12-15 DIAGNOSIS — D692 Other nonthrombocytopenic purpura: Secondary | ICD-10-CM | POA: Diagnosis not present

## 2022-12-15 DIAGNOSIS — Z85828 Personal history of other malignant neoplasm of skin: Secondary | ICD-10-CM | POA: Diagnosis not present

## 2022-12-15 DIAGNOSIS — L821 Other seborrheic keratosis: Secondary | ICD-10-CM | POA: Diagnosis not present

## 2022-12-15 DIAGNOSIS — D225 Melanocytic nevi of trunk: Secondary | ICD-10-CM | POA: Diagnosis not present

## 2022-12-15 DIAGNOSIS — L570 Actinic keratosis: Secondary | ICD-10-CM | POA: Diagnosis not present

## 2023-01-06 ENCOUNTER — Encounter: Payer: Self-pay | Admitting: Family Medicine

## 2023-01-06 ENCOUNTER — Ambulatory Visit (INDEPENDENT_AMBULATORY_CARE_PROVIDER_SITE_OTHER): Payer: Medicare HMO | Admitting: Family Medicine

## 2023-01-06 VITALS — BP 142/82 | HR 66 | Temp 98.1°F | Ht 69.0 in | Wt 151.6 lb

## 2023-01-06 DIAGNOSIS — Z23 Encounter for immunization: Secondary | ICD-10-CM

## 2023-01-06 DIAGNOSIS — Z1152 Encounter for screening for COVID-19: Secondary | ICD-10-CM | POA: Diagnosis not present

## 2023-01-06 DIAGNOSIS — J029 Acute pharyngitis, unspecified: Secondary | ICD-10-CM | POA: Diagnosis not present

## 2023-01-06 LAB — POCT RAPID STREP A (OFFICE): Rapid Strep A Screen: NEGATIVE

## 2023-01-06 LAB — POC COVID19 BINAXNOW: SARS Coronavirus 2 Ag: NEGATIVE

## 2023-01-06 MED ORDER — AZITHROMYCIN 250 MG PO TABS: ORAL_TABLET | ORAL | 0 refills | Status: AC

## 2023-01-06 NOTE — Progress Notes (Addendum)
Established Patient Office Visit   Subjective:  Patient ID: [name], male    DOB: Aug 10, 1940  Age: 82 y.o. MRN: [id number]  Chief Complaint  Patient presents with   Sore Throat    Ride side sore throat with redness.     Sore Throat  Pertinent negatives include no abdominal pain, congestion, headaches or vomiting.   Encounter Diagnoses  Name Primary?   Encounter for screening for COVID-19 Yes   Sore throat    Need for immunization against influenza    4 to 5-day history of sore throat on the right side.  He denies headaches fevers chills nausea, cough, myalgias or arthralgias.   Review of Systems  Constitutional: Negative.   HENT:  Positive for sore throat. Negative for congestion and sinus pain.   Eyes:  Negative for blurred vision, discharge and redness.  Respiratory: Negative.    Cardiovascular: Negative.   Gastrointestinal:  Negative for abdominal pain, nausea and vomiting.  Genitourinary: Negative.   Musculoskeletal: Negative.  Negative for joint pain and myalgias.  Skin:  Negative for rash.  Neurological:  Negative for tingling, loss of consciousness, weakness and headaches.  Endo/Heme/Allergies:  Negative for polydipsia.     Current Outpatient Medications:    azithromycin (ZITHROMAX) 250 MG tablet, Take 2 tablets on day 1, then 1 tablet daily on days 2 through 5, Disp: 6 tablet, Rfl: 0   dorzolamide-timolol (COSOPT) 22.3-6.8 MG/ML ophthalmic solution, Place 1 drop into both eyes 2 (two) times daily., Disp: , Rfl:    doxazosin (CARDURA) 2 MG tablet, TAKE 1 TABLET BY MOUTH DAILY, Disp: 90 tablet, Rfl: 1   escitalopram (LEXAPRO) 20 MG tablet, Take 1 tablet (20 mg total) by mouth daily., Disp: 180 tablet, Rfl: 0   fluticasone (FLONASE) 50 MCG/ACT nasal spray, Place 1 spray into both nostrils daily as needed for allergies or rhinitis., Disp: , Rfl:    latanoprost (XALATAN) 0.005 % ophthalmic solution, Place 1 drop into both eyes at bedtime., Disp: , Rfl:     levothyroxine (SYNTHROID) 75 MCG tablet, TAKE 1 TABLET BY MOUTH DAILY, Disp: 90 tablet, Rfl: 3   losartan (COZAAR) 100 MG tablet, TAKE 1 TABLET BY MOUTH DAILY, Disp: 90 tablet, Rfl: 3   Nutritional Supplements (ENSURE ACTIVE PO), TAKE 3 TO 4 CARTONS ANY FLAVOR BY MOUTH DAILY, Disp: , Rfl:    simvastatin (ZOCOR) 20 MG tablet, TAKE ONE TABLET BY MOUTH EVERY EVENING, Disp: 90 tablet, Rfl: 3   ipratropium (ATROVENT) 0.03 % nasal spray, Place 2 sprays into both nostrils every 12 (twelve) hours. (Patient not taking: Reported on 09/22/2022), Disp: 30 mL, Rfl: 3   Objective:     BP (!) 142/82   Pulse 66   Temp 98.1 F (36.7 C)   Ht 5\' 9"  (1.753 m)   Wt 151 lb 9.6 oz (68.8 kg)   SpO2 98%   BMI 22.39 kg/m    Physical Exam Constitutional:      General: He is not in acute distress.    Appearance: Normal appearance. He is not ill-appearing, toxic-appearing or diaphoretic.  HENT:     Head: Normocephalic and atraumatic.     Right Ear: External ear normal.     Left Ear: External ear normal.     Mouth/Throat:     Mouth: Mucous membranes are dry.     Pharynx: Posterior oropharyngeal erythema present.     Comments: Mild erythema of the right Palatine arch Eyes:     General: No scleral  icterus.       Right eye: No discharge.        Left eye: No discharge.     Extraocular Movements: Extraocular movements intact.     Conjunctiva/sclera: Conjunctivae normal.  Pulmonary:     Effort: Pulmonary effort is normal. No respiratory distress.  Skin:    General: Skin is warm and dry.  Neurological:     Mental Status: He is alert and oriented to person, place, and time.  Psychiatric:        Mood and Affect: Mood normal.        Behavior: Behavior normal.      Results for orders placed or performed in visit on 01/06/23  POCT rapid strep A  Result Value Ref Range   Rapid Strep A Screen Negative Negative  POC COVID-19 BinaxNow  Result Value Ref Range   SARS Coronavirus 2 Ag Negative Negative       The ASCVD Risk score (Arnett DK, et al., 2019) failed to calculate for the following reasons:   The 2019 ASCVD risk score is only valid for ages 65 to 23    Assessment & Plan:   Encounter for screening for COVID-19 -     POC COVID-19 BinaxNow  Sore throat -     POCT rapid strep A -     Azithromycin; Take 2 tablets on day 1, then 1 tablet daily on days 2 through 5  Dispense: 6 tablet; Refill: 0  Need for immunization against influenza -     Flu vaccine HIGH DOSE PF    Return if symptoms worsen or fail to improve.    Mliss Sax, MD

## 2023-01-06 NOTE — Addendum Note (Signed)
Addended by: Andrez Grime on: 01/06/2023 12:31 PM   Modules accepted: Orders

## 2023-01-13 ENCOUNTER — Other Ambulatory Visit: Payer: Self-pay

## 2023-01-13 ENCOUNTER — Emergency Department (HOSPITAL_BASED_OUTPATIENT_CLINIC_OR_DEPARTMENT_OTHER)
Admission: EM | Admit: 2023-01-13 | Discharge: 2023-01-13 | Disposition: A | Discharge status: Home / Self Care | Payer: Medicare HMO | Attending: Emergency Medicine | Admitting: Emergency Medicine

## 2023-01-13 ENCOUNTER — Encounter (HOSPITAL_BASED_OUTPATIENT_CLINIC_OR_DEPARTMENT_OTHER): Payer: Self-pay

## 2023-01-13 DIAGNOSIS — J029 Acute pharyngitis, unspecified: Secondary | ICD-10-CM | POA: Insufficient documentation

## 2023-01-13 DIAGNOSIS — Z8521 Personal history of malignant neoplasm of larynx: Secondary | ICD-10-CM | POA: Insufficient documentation

## 2023-01-13 LAB — GROUP A STREP BY PCR: Group A Strep by PCR: NOT DETECTED

## 2023-01-13 MED ORDER — NYSTATIN 100000 UNIT/ML MT SUSP: 500000.0000 [IU] | Freq: Four times a day (QID) | OROMUCOSAL | 0 refills | Status: DC

## 2023-01-13 NOTE — Discharge Instructions (Signed)
You have been seen and discharged from the emergency department.  Your strep testing was negative here as well.  Based off of physical exam I believe that you are suffering from thrush.  Use the oral suspension medication as prescribed and follow-up with ENT.  Follow-up with your primary provider for further evaluation and further care. Take home medications as prescribed. If you have any worsening symptoms or further concerns for your health please return to an emergency department for further evaluation.

## 2023-01-13 NOTE — ED Triage Notes (Signed)
Pt presents to ED from home C/O sore throat X 2 weeks. Reports saw PCP, stated they did not see anything. 2 month wait for ENT. Hx throat cancer s/p radiation.

## 2023-01-13 NOTE — ED Provider Notes (Signed)
[place] [facility] AT [facility] Provider Note   CSN: [id number] Arrival date & time: 01/13/23  1114     History  Chief Complaint  Patient presents with   Sore Throat    [name] is a 82 y.o. male.  HPI   82 year old male with past medical history of throat cancer status post radiation in 1989 presents emergency department with sore throat.  Patient states this has been going on for the past couple weeks.  He recently has had weight loss and difficulty swallowing solids secondary to his history of radiation.  They have trialed a feeding to that was unsuccessful.  Since then he has been trying Ensure shakes.  He states over the couple weeks he has developed a red and sore throat, painful swallowing.  But there is no pain running down his neck, no abnormal swelling, no voice change.  He states that he was seen by his primary doctor and outpatient testing for strep was negative but he presents today with worsening symptoms.  Mouth feels dry and uncomfortable.  Home Medications Prior to Admission medications   Medication Sig Start Date End Date Taking? Authorizing Provider  nystatin (MYCOSTATIN) 100000 UNIT/ML suspension Take 5 mLs (500,000 Units total) by mouth 4 (four) times daily. 01/13/23  Yes Briane Birden M, DO  dorzolamide-timolol (COSOPT) 22.3-6.8 MG/ML ophthalmic solution Place 1 drop into both eyes 2 (two) times daily.    [provider]  doxazosin (CARDURA) 2 MG tablet TAKE 1 TABLET BY MOUTH DAILY 09/28/22   Mliss Sax, MD  escitalopram (LEXAPRO) 20 MG tablet Take 1 tablet (20 mg total) by mouth daily. 09/14/22   Mliss Sax, MD  fluticasone [facility]) 50 MCG/ACT nasal spray Place 1 spray into both nostrils daily as needed for allergies or rhinitis.    [provider]  ipratropium (ATROVENT) 0.03 % nasal spray Place 2 sprays into both nostrils every 12 (twelve) hours. Patient not taking: Reported on  09/22/2022 12/29/21   Mliss Sax, MD  latanoprost (XALATAN) 0.005 % ophthalmic solution Place 1 drop into both eyes at bedtime. 06/24/20   [provider]  levothyroxine (SYNTHROID) 75 MCG tablet TAKE 1 TABLET BY MOUTH DAILY 12/01/22   Mliss Sax, MD  losartan (COZAAR) 100 MG tablet TAKE 1 TABLET BY MOUTH DAILY 09/28/22   Mliss Sax, MD  Nutritional Supplements (ENSURE ACTIVE PO) TAKE 3 TO 4 CARTONS ANY FLAVOR BY MOUTH DAILY 08/19/22   [provider]  simvastatin (ZOCOR) 20 MG tablet TAKE ONE TABLET BY MOUTH EVERY EVENING 05/13/22   Mliss Sax, MD      Allergies    Travoprost, Amlodipine, Lumigan [bimatoprost], Combigan [brimonidine tartrate-timolol], and Codeine    Review of Systems   Review of Systems  Constitutional:  Negative for fever.  HENT:  Positive for sore throat. Negative for facial swelling, trouble swallowing and voice change.   Respiratory:  Negative for cough.   Gastrointestinal:  Negative for abdominal pain, nausea and vomiting.    Physical Exam Updated Vital Signs BP (!) 171/85 (BP Location: Left Arm)   Pulse (!) 59   Temp 97.6 F (36.4 C) (Oral)   Resp 16   Ht 5\' 9"  (1.753 m)   Wt 68 kg   SpO2 100%   BMI 22.15 kg/m  Physical Exam Vitals and nursing note reviewed.  Constitutional:      Appearance: Normal appearance.  HENT:     Head: Normocephalic.  Mouth/Throat:     Mouth: Mucous membranes are dry.     Pharynx: Uvula midline. Posterior oropharyngeal erythema present.     Tonsils: No tonsillar exudate.     Comments: What appears to be pseudomembranous plaques on the mid to posterior aspect of the tongue, scrapes off with a red base Cardiovascular:     Rate and Rhythm: Normal rate.  Pulmonary:     Effort: Pulmonary effort is normal. No respiratory distress.  Abdominal:     Palpations: Abdomen is soft.     Tenderness: There is no abdominal tenderness.  Skin:    General: Skin is warm.   Neurological:     Mental Status: He is alert and oriented to person, place, and time. Mental status is at baseline.  Psychiatric:        Mood and Affect: Mood normal.     ED Results / Procedures / Treatments   Labs (all labs ordered are listed, but only abnormal results are displayed) Labs Reviewed  GROUP A STREP BY PCR    EKG None  Radiology No results found.  Procedures Procedures    Medications Ordered in ED Medications - No data to display  ED Course/ Medical Decision Making/ A&P                                 Medical Decision Making Risk Prescription drug management.   82 year old male presents emergency department with sore throat.  Does have history of throat cancer status post radiation which is the reason why he does not tolerate solids and has been having to use Ensure shakes.  Vitals are normal and stable.  His pain is all intraoral and upper throat, there is no anterior neck pain.  There is no swelling appreciated on exam, voice is baseline, swallowing freely.  Oropharynx is erythematous, he has what appears to be pseudomembranous plaques on the back half of the tongue.  The easily scraped off, red base underneath.  Repeat strep testing here is negative, my biggest concern is for thrush.  Will plan for nystatin treatment and outpatient follow-up.  He does have follow-up scheduled with ENT for further evaluation.  He does not have any symptoms of abnormal swelling/throat swelling or anything that would indicate emergent imaging in regards to recurrent cancer.  Patient at this time appears safe and stable for discharge and close outpatient follow up. Discharge plan and strict return to ED precautions discussed, patient verbalizes understanding and agreement.        Final Clinical Impression(s) / ED Diagnoses Final diagnoses:  Sore throat    Rx / DC Orders ED Discharge Orders          Ordered    nystatin (MYCOSTATIN) 100000 UNIT/ML suspension  4  times daily        01/13/23 1334              Tyner Codner, Clabe Seal, DO 01/13/23 1415

## 2023-01-13 NOTE — Addendum Note (Signed)
Addended by: Solon Palm on: 01/13/2023 03:33 PM   Modules accepted: Orders

## 2023-01-15 ENCOUNTER — Other Ambulatory Visit (HOSPITAL_BASED_OUTPATIENT_CLINIC_OR_DEPARTMENT_OTHER): Payer: Self-pay

## 2023-01-15 ENCOUNTER — Telehealth (HOSPITAL_BASED_OUTPATIENT_CLINIC_OR_DEPARTMENT_OTHER): Payer: Self-pay | Admitting: Emergency Medicine

## 2023-01-15 MED ORDER — NYSTATIN 100000 UNIT/ML MT SUSP
5.0000 mL | Freq: Four times a day (QID) | OROMUCOSAL | 0 refills | Status: DC
  Filled 2023-01-15: qty 200, 10d supply, fill #0

## 2023-01-15 MED ORDER — COMIRNATY 30 MCG/0.3ML IM SUSY
0.3000 mL | PREFILLED_SYRINGE | Freq: Once | INTRAMUSCULAR | 0 refills | Status: AC
  Filled 2023-01-15: qty 0.3, 1d supply, fill #0

## 2023-01-26 DIAGNOSIS — K1370 Unspecified lesions of oral mucosa: Secondary | ICD-10-CM | POA: Diagnosis not present

## 2023-03-07 ENCOUNTER — Other Ambulatory Visit: Payer: Self-pay | Admitting: Family Medicine

## 2023-03-07 DIAGNOSIS — J309 Allergic rhinitis, unspecified: Secondary | ICD-10-CM

## 2023-03-08 ENCOUNTER — Ambulatory Visit (INDEPENDENT_AMBULATORY_CARE_PROVIDER_SITE_OTHER): Payer: Medicare HMO

## 2023-03-08 DIAGNOSIS — Z0000 Encounter for general adult medical examination without abnormal findings: Secondary | ICD-10-CM | POA: Diagnosis not present

## 2023-03-08 NOTE — Progress Notes (Signed)
Subjective:   [name] is a 82 y.o. male who presents for Medicare Annual/Subsequent preventive examination.  Visit Complete: Virtual I connected with  [name] on 03/08/23 by a audio enabled telemedicine application and verified that I am speaking with the correct person using two identifiers.  Patient Location: Home  Provider Location: Office/Clinic  I discussed the limitations of evaluation and management by telemedicine. The patient expressed understanding and agreed to proceed.  Vital Signs: Because this visit was a virtual/telehealth visit, some criteria may be missing or patient reported. Any vitals not documented were not able to be obtained and vitals that have been documented are patient reported.    Cardiac Risk Factors include: advanced age (>62men, >65 women);dyslipidemia;hypertension;male gender     Objective:    Today's Vitals   There is no height or weight on file to calculate BMI.     03/08/2023    9:01 AM 11/07/2021    9:01 AM 09/23/2021   10:18 AM 08/06/2020    8:01 AM 09/15/2018   10:08 AM 01/05/2018    9:42 AM 03/16/2017    8:31 AM  Advanced Directives  Does Patient Have a Medical Advance Directive? Yes Yes Yes Yes No Yes Yes  Type of Estate agent of [place];Living will Healthcare Power of [place];Living will Healthcare Power of [place];Living will Healthcare Power of [place];Living will  Healthcare Power of [place];Living will Living will  Does patient want to make changes to medical advance directive?  No - Patient declined No - Patient declined No - Patient declined  No - Patient declined No - Patient declined  Copy of Healthcare Power of Attorney in Chart? Yes - validated most recent copy scanned in chart (See row information) No - copy requested Yes - validated most recent copy scanned in chart (See row information) No - copy requested  Yes   Would patient like information on creating a medical advance directive?      No - Patient declined      Current Medications (verified) Outpatient Encounter Medications as of 03/08/2023  Medication Sig   dorzolamide-timolol (COSOPT) 22.3-6.8 MG/ML ophthalmic solution Place 1 drop into both eyes 2 (two) times daily.   doxazosin (CARDURA) 2 MG tablet TAKE 1 TABLET BY MOUTH DAILY   escitalopram (LEXAPRO) 20 MG tablet Take 1 tablet (20 mg total) by mouth daily.   fluticasone (FLONASE) 50 MCG/ACT nasal spray Place 1 spray into both nostrils daily as needed for allergies or rhinitis.   ipratropium (ATROVENT) 0.03 % nasal spray Place 2 sprays into both nostrils every 12 (twelve) hours.   latanoprost (XALATAN) 0.005 % ophthalmic solution Place 1 drop into both eyes at bedtime.   levothyroxine (SYNTHROID) 75 MCG tablet TAKE 1 TABLET BY MOUTH DAILY   losartan (COZAAR) 100 MG tablet TAKE 1 TABLET BY MOUTH DAILY   Nutritional Supplements (ENSURE ACTIVE PO) TAKE 3 TO 4 CARTONS ANY FLAVOR BY MOUTH DAILY   simvastatin (ZOCOR) 20 MG tablet TAKE ONE TABLET BY MOUTH EVERY EVENING   nystatin (MYCOSTATIN) 100000 UNIT/ML suspension Take 5 mLs (500,000 Units total) by mouth 4 (four) times daily. (Patient not taking: Reported on 03/08/2023)   nystatin (MYCOSTATIN) 100000 UNIT/ML suspension Take 5 mLs (500,000 Units total) by mouth 4 (four) times daily. (Patient not taking: Reported on 03/08/2023)   No facility-administered encounter medications on file as of 03/08/2023.    Allergies (verified) Travoprost, Amlodipine, Lumigan [bimatoprost], Combigan [brimonidine tartrate-timolol], and Codeine   History: Past Medical History:  Diagnosis Date   Anxiety state 08/05/2021   Arthritis    BPH (benign prostatic hyperplasia)    Bruises easily    Cancer (HCC)    throat   Complication of anesthesia    difficult urination after anesthesia   Dizziness    GERD (gastroesophageal reflux disease)    denies   Glaucoma    left worse than right   H/O hiatal hernia    Heart murmur    pt. reports   doctors can't hear it   Hyperlipemia    Hypertension    takes meds daily   Hypothyroidism    Transient alteration of awareness    ~ 12/27/17, evaluated by neurologist Dr. Epimenio Foot 01/06/18   Urinary retention    Past Surgical History:  Procedure Laterality Date   ANTERIOR LAT LUMBAR FUSION  03/11/2012   Procedure: ANTERIOR LATERAL LUMBAR FUSION 1 LEVEL;  Surgeon: Temple Pacini, MD;  Location: [facility];  Service: Neurosurgery;  Laterality: Left;  Left lumbar four-five extreme lumbar interbody fusion with percutaneous pedicle screws    BACK SURGERY     ruptured disc repair   COLONOSCOPY W/ POLYPECTOMY     EYE SURGERY     left cataract   HERNIA REPAIR     INGUINAL HERNIA REPAIR Right 05/22/2014   Procedure: REPAIR RECURRENT RIGHT INGUINAL HERNIA;  Surgeon: Frederik Schmidt, MD;  Location[facility];  Service: General;  Laterality: Right;   INGUINAL HERNIA REPAIR Right 10/01/2021   Procedure: LAPAROSCOPIC INGUINAL HERNIA REPAIR;  Surgeon: Axel Filler, MD;  Location: [facility] OR;  Service: General;  Laterality: Right;   INSERTION OF MESH Right 05/22/2014   Procedure: INSERTION OF MESH;  Surgeon: Frederik Schmidt, MD;  Location: [facility] OR;  Service: General;  Laterality: Right;   INSERTION OF MESH Left 08/06/2020   Procedure: INSERTION OF MESH;  Surgeon: Axel Filler, MD;  Location: [facility] OR;  Service: General;  Laterality: Left;   INSERTION OF MESH Right 10/01/2021   Procedure: INSERTION OF MESH;  Surgeon: Axel Filler, MD;  Location: [facility] OR;  Service: General;  Laterality: Right;   LUMBAR LAMINECTOMY/DECOMPRESSION MICRODISCECTOMY Left 01/11/2018   Procedure: Laminectomy and Foraminotomy - Left - Lumbar Three-Lumbar Four - Lumbar Five-Sacral One;  Surgeon: Julio Sicks, MD;  Location: [facility] OR;  Service: Neurosurgery;  Laterality: Left;  Laminectomy and Foraminotomy - Left - Lumbar Three-Lumbar Four - Lumbar Five-Sacral One   LUMBAR PERCUTANEOUS PEDICLE SCREW 1 LEVEL  03/11/2012   Procedure: LUMBAR PERCUTANEOUS PEDICLE  SCREW 1 LEVEL;  Surgeon: Temple Pacini, MD;  Location: [facility];  Service: Neurosurgery;  Laterality: Left;  Left lumbar four-five extreme lumbar interbody fusion with percutaneous pedicle screws    RADICAL NECK DISSECTION  1989   ROTATOR CUFF REPAIR     right   TONSILLECTOMY     XI ROBOTIC ASSISTED VENTRAL HERNIA N/A 08/06/2020   Procedure: XI ROBOTIC ASSISTED INCISIONAL FLANK  HERNIA REPAIR WITH MESH;  Surgeon: Axel Filler, MD;  Location: [facility] OR;  Service: General;  Laterality: N/A;   History reviewed. No pertinent family history. Social History   Socioeconomic History   Marital status: Married    Spouse name: Not on file   Number of children: Not on file   Years of education: Not on file   Highest education level: 12th grade  Occupational History   Not on file  Tobacco Use   Smoking status: Former   Smokeless tobacco: Never  Vaping Use   Vaping status: Never  Used  Substance and Sexual Activity   Alcohol use: Yes    Comment: social 1 or 2 beers a week   Drug use: No   Sexual activity: Not Currently  Other Topics Concern   Not on file  Social History Narrative   Not on file   Social Determinants of Health   Financial Resource Strain: Low Risk  (03/08/2023)   Overall Financial Resource Strain (CARDIA)    Difficulty of Paying Living Expenses: Not hard at all  Food Insecurity: No Food Insecurity (03/08/2023)   Hunger Vital Sign    Worried About Running Out of Food in the Last Year: Never true    Ran Out of Food in the Last Year: Never true  Transportation Needs: No Transportation Needs (03/08/2023)   PRAPARE - Administrator, Civil Service (Medical): No    Lack of Transportation (Non-Medical): No  Physical Activity: Insufficiently Active (03/08/2023)   Exercise Vital Sign    Days of Exercise per Week: 2 days    Minutes of Exercise per Session: 30 min  Stress: No Stress Concern Present (03/08/2023)   [employer] of Occupational Health - Occupational  Stress Questionnaire    Feeling of Stress : Not at all  Social Connections: Moderately Isolated (03/08/2023)   Social Connection and Isolation Panel [NHANES]    Frequency of Communication with Friends and Family: Three times a week    Frequency of Social Gatherings with Friends and Family: Twice a week    Attends Religious Services: Never    Database administrator or Organizations: No    Attends Engineer, structural: Never    Marital Status: Married    Tobacco Counseling Counseling given: Not Answered   Clinical Intake:  Pre-visit preparation completed: Yes  Pain : No/denies pain     Nutritional Risks: None Diabetes: No  How often do you need to have someone help you when you read instructions, pamphlets, or other written materials from your doctor or pharmacy?: 1 - Never  Interpreter Needed?: No  Information entered by :: NAllen LPN   Activities of Daily Living    03/08/2023    8:56 AM 05/08/2022   10:27 AM  In your present state of health, do you have any difficulty performing the following activities:  Hearing? 0 0  Comment has hearing aids   Vision? 0 0  Difficulty concentrating or making decisions? 0 0  Walking or climbing stairs? 0 0  Dressing or bathing? 0   Doing errands, shopping? 0 0  Preparing Food and eating ? N   Using the Toilet? N   In the past six months, have you accidently leaked urine? N   Do you have problems with loss of bowel control? N   Managing your Medications? N   Managing your Finances? N   Housekeeping or managing your Housekeeping? N     Patient Care Team: Mliss Sax, MD as PCP - General (Family Medicine)  Indicate any recent Medical Services you may have received from other than [facility] providers in the past year (date may be approximate).     Assessment:   This is a routine wellness examination for [place].  Hearing/Vision screen Hearing Screening - Comments:: Has hearing aids that are maintained Vision  Screening - Comments:: Regular eye exams, in [place]   Goals Addressed             This Visit's Progress    Patient Stated  03/08/2023, stay independent       Depression Screen    03/08/2023    9:03 AM 09/22/2022    2:21 PM 07/21/2022    4:04 PM 05/08/2022   10:26 AM 02/05/2022   10:23 AM 08/05/2021    9:35 AM 05/07/2021   10:07 AM  PHQ 2/9 Scores  PHQ - 2 Score 0 0 0 0 0 0 0  PHQ- 9 Score 0          Fall Risk    03/08/2023    9:02 AM 09/22/2022    2:21 PM 07/21/2022    4:04 PM 05/08/2022   10:26 AM 02/05/2022   10:23 AM  Fall Risk   Falls in the past year? 0 0 0 0 0  Number falls in past yr: 0 0 0 0 0  Injury with Fall? 0 0 0 0 0  Risk for fall due to : Medication side effect No Fall Risks No Fall Risks    Follow up Falls prevention discussed;Falls evaluation completed Falls evaluation completed Falls evaluation completed      MEDICARE RISK AT HOME: Medicare Risk at Home Any stairs in or around the home?: Yes If so, are there any without handrails?: No Home free of loose throw rugs in walkways, pet beds, electrical cords, etc?: Yes Adequate lighting in your home to reduce risk of falls?: Yes Life alert?: No Use of a cane, walker or w/c?: No Grab bars in the bathroom?: Yes Shower chair or bench in shower?: Yes Elevated toilet seat or a handicapped toilet?: Yes  TIMED UP AND GO:  Was the test performed?  No    Cognitive Function:        03/08/2023    9:03 AM 09/15/2018   10:09 AM 03/16/2017    9:07 AM  6CIT Screen  What Year? 0 points 0 points 0 points  What month? 0 points 0 points 0 points  What time? 0 points 0 points 0 points  Count back from 20 0 points 0 points 0 points  Months in reverse 2 points 0 points 0 points  Repeat phrase 2 points 0 points 0 points  Total Score 4 points 0 points 0 points    Immunizations Immunization History  Administered Date(s) Administered   Fluad Quad(high Dose 65+) 01/30/2019, 01/30/2020   Fluad Trivalent(High  Dose 65+) 01/06/2023   Influenza, High Dose Seasonal PF 03/03/2016, 02/08/2018, 01/07/2021, 02/05/2022   Influenza,inj,Quad PF,6+ Mos 03/16/2017   Influenza-Unspecified 02/05/2006, 01/17/2009, 01/23/2010, 02/11/2011, 02/19/2012, 01/13/2013, 01/17/2014, 02/28/2015   PFIZER Comirnaty(Gray Top)Covid-19 Tri-Sucrose Vaccine 10/03/2020   PFIZER(Purple Top)SARS-COV-2 Vaccination 05/25/2019, 06/15/2019, 02/17/2020   PNEUMOCOCCAL CONJUGATE-20 08/08/2021   Pfizer Covid-19 Vaccine Bivalent Booster 11yrs & up 02/11/2021   Pfizer(Comirnaty)Fall Seasonal Vaccine 12 years and older 01/15/2023   Pneumococcal Conjugate-13 11/01/2005, 08/01/2013   Pneumococcal Polysaccharide-23 10/22/2005, 05/11/2017   Tdap 12/12/2008, 05/08/2022   Zoster Recombinant(Shingrix) 09/03/2016, 01/16/2017, 08/08/2021, 01/02/2022   Zoster, Live 11/13/2005    TDAP status: Up to date  Flu Vaccine status: Up to date  Pneumococcal vaccine status: Up to date  Covid-19 vaccine status: Information provided on how to obtain vaccines.   Qualifies for Shingles Vaccine? Yes   Zostavax completed Yes   Shingrix Completed?: Yes  Screening Tests Health Maintenance  Topic Date Due   Medicare Annual Wellness (AWV)  03/07/2024   DTaP/Tdap/Td (3 - Td or Tdap) 05/08/2032   Pneumonia Vaccine 30+ Years old  Completed   INFLUENZA VACCINE  Completed   Zoster Vaccines-  Shingrix  Completed   HPV VACCINES  Aged Out   COVID-19 Vaccine  Discontinued    Health Maintenance  There are no preventive care reminders to display for this patient.   Colorectal cancer screening: No longer required.   Lung Cancer Screening: (Low Dose CT Chest recommended if Age 82-80 years, 20 pack-year currently smoking OR have quit w/in 15years.) does not qualify.   Lung Cancer Screening Referral: no  Additional Screening:  Hepatitis C Screening: does not qualify;   Vision Screening: Recommended annual ophthalmology exams for early detection of glaucoma and  other disorders of the eye. Is the patient up to date with their annual eye exam?  Yes  Who is the provider or what is the name of the office in which the patient attends annual eye exams? In [facility] If pt is not established with a provider, would they like to be referred to a provider to establish care? No .   Dental Screening: Recommended annual dental exams for proper oral hygiene  Diabetic Foot Exam:   Community Resource Referral / Chronic Care Management: CRR required this visit?  No   CCM required this visit?  No     Plan:     I have personally reviewed and noted the following in the patient's chart:   Medical and social history Use of alcohol, tobacco or illicit drugs  Current medications and supplements including opioid prescriptions. Patient is not currently taking opioid prescriptions. Functional ability and status Nutritional status Physical activity Advanced directives List of other physicians Hospitalizations, surgeries, and ER visits in previous 12 months Vitals Screenings to include cognitive, depression, and falls Referrals and appointments  In addition, I have reviewed and discussed with patient certain preventive protocols, quality metrics, and best practice recommendations. A written personalized care plan for preventive services as well as general preventive health recommendations were provided to patient.     Barb Merino, LPN   16/05/958   After Visit Summary: (MyChart) Due to this being a telephonic visit, the after visit summary with patients personalized plan was offered to patient via MyChart   Nurse Notes: none

## 2023-03-08 NOTE — Patient Instructions (Signed)
[name] , Thank you for taking time to come for your Medicare Wellness Visit. I appreciate your ongoing commitment to your health goals. Please review the following plan we discussed and let me know if I can assist you in the future.   Referrals/Orders/Follow-Ups/Clinician Recommendations: none  This is a list of the screening recommended for you and due dates:  Health Maintenance  Topic Date Due   Medicare Annual Wellness Visit  03/07/2024   DTaP/Tdap/Td vaccine (3 - Td or Tdap) 05/08/2032   Pneumonia Vaccine  Completed   Flu Shot  Completed   Zoster (Shingles) Vaccine  Completed   HPV Vaccine  Aged Out   COVID-19 Vaccine  Discontinued    Advanced directives: (In Chart) A copy of your advanced directives are scanned into your chart should your provider ever need it.  Next Medicare Annual Wellness Visit scheduled for next year: Yes  insert Preventive Care attachment Insert FALL PREVENTION attachment if needed

## 2023-03-18 ENCOUNTER — Other Ambulatory Visit: Payer: Self-pay | Admitting: Family Medicine

## 2023-03-18 DIAGNOSIS — F411 Generalized anxiety disorder: Secondary | ICD-10-CM

## 2023-03-18 MED ORDER — ESCITALOPRAM OXALATE 20 MG PO TABS: 20.0000 mg | ORAL_TABLET | Freq: Every day | ORAL | 0 refills | Status: DC

## 2023-03-18 NOTE — Telephone Encounter (Signed)
Prescription Request  03/18/2023  LOV: 01/06/2023  What is the name of the medication or equipment? escitalopram (LEXAPRO) 20 MG tablet [[id number]]   Have you contacted your pharmacy to request a refill? No   Which pharmacy would you like this sent to?  [facility] - [place], [place] - [address] [place] [address] Phone: [phone] Fax: [phone]   Patient notified that their request is being sent to the clinical staff for review and that they should receive a response within 2 business days.   Please advise at Mobile [phone] (mobile)

## 2023-03-18 NOTE — Telephone Encounter (Signed)
Refill request for  Escitalopram 20 mg LR  09/14/22, #180, 0 rf LOV  09/22/22 FOV  05/06/23  Please review and advise.  Thanks. Dm/cma

## 2023-03-19 NOTE — Telephone Encounter (Signed)
Patient notified VIA phone. Dm/cma  

## 2023-03-30 ENCOUNTER — Other Ambulatory Visit: Payer: Self-pay | Admitting: Family Medicine

## 2023-03-30 DIAGNOSIS — I10 Essential (primary) hypertension: Secondary | ICD-10-CM

## 2023-04-08 DIAGNOSIS — H52223 Regular astigmatism, bilateral: Secondary | ICD-10-CM | POA: Diagnosis not present

## 2023-04-08 DIAGNOSIS — H401132 Primary open-angle glaucoma, bilateral, moderate stage: Secondary | ICD-10-CM | POA: Diagnosis not present

## 2023-04-08 DIAGNOSIS — H524 Presbyopia: Secondary | ICD-10-CM | POA: Diagnosis not present

## 2023-04-08 DIAGNOSIS — H04123 Dry eye syndrome of bilateral lacrimal glands: Secondary | ICD-10-CM | POA: Diagnosis not present

## 2023-04-08 DIAGNOSIS — H5203 Hypermetropia, bilateral: Secondary | ICD-10-CM | POA: Diagnosis not present

## 2023-04-08 DIAGNOSIS — Z961 Presence of intraocular lens: Secondary | ICD-10-CM | POA: Diagnosis not present

## 2023-04-19 DIAGNOSIS — D485 Neoplasm of uncertain behavior of skin: Secondary | ICD-10-CM | POA: Diagnosis not present

## 2023-04-19 DIAGNOSIS — D2262 Melanocytic nevi of left upper limb, including shoulder: Secondary | ICD-10-CM | POA: Diagnosis not present

## 2023-04-19 DIAGNOSIS — L309 Dermatitis, unspecified: Secondary | ICD-10-CM | POA: Diagnosis not present

## 2023-04-19 DIAGNOSIS — L821 Other seborrheic keratosis: Secondary | ICD-10-CM | POA: Diagnosis not present

## 2023-04-19 DIAGNOSIS — D2261 Melanocytic nevi of right upper limb, including shoulder: Secondary | ICD-10-CM | POA: Diagnosis not present

## 2023-04-19 DIAGNOSIS — L905 Scar conditions and fibrosis of skin: Secondary | ICD-10-CM | POA: Diagnosis not present

## 2023-04-19 DIAGNOSIS — D224 Melanocytic nevi of scalp and neck: Secondary | ICD-10-CM | POA: Diagnosis not present

## 2023-04-19 DIAGNOSIS — D0439 Carcinoma in situ of skin of other parts of face: Secondary | ICD-10-CM | POA: Diagnosis not present

## 2023-04-19 DIAGNOSIS — L570 Actinic keratosis: Secondary | ICD-10-CM | POA: Diagnosis not present

## 2023-04-22 DIAGNOSIS — K1370 Unspecified lesions of oral mucosa: Secondary | ICD-10-CM | POA: Diagnosis not present

## 2023-04-26 ENCOUNTER — Other Ambulatory Visit: Payer: Self-pay | Admitting: Family Medicine

## 2023-04-26 DIAGNOSIS — E785 Hyperlipidemia, unspecified: Secondary | ICD-10-CM

## 2023-05-06 ENCOUNTER — Ambulatory Visit: Payer: Medicare HMO | Admitting: Family Medicine

## 2023-05-06 ENCOUNTER — Encounter: Payer: Self-pay | Admitting: Family Medicine

## 2023-05-06 VITALS — BP 118/78 | HR 78 | Temp 98.0°F | Ht 69.0 in | Wt 154.8 lb

## 2023-05-06 DIAGNOSIS — E785 Hyperlipidemia, unspecified: Secondary | ICD-10-CM

## 2023-05-06 DIAGNOSIS — F411 Generalized anxiety disorder: Secondary | ICD-10-CM | POA: Diagnosis not present

## 2023-05-06 DIAGNOSIS — I10 Essential (primary) hypertension: Secondary | ICD-10-CM

## 2023-05-06 LAB — CBC
HCT: 37.2 % — ABNORMAL LOW (ref 39.0–52.0)
Hemoglobin: 12.6 g/dL — ABNORMAL LOW (ref 13.0–17.0)
MCHC: 33.9 g/dL (ref 30.0–36.0)
MCV: 98.9 fL (ref 78.0–100.0)
Platelets: 110 10*3/uL — ABNORMAL LOW (ref 150.0–400.0)
RBC: 3.76 Mil/uL — ABNORMAL LOW (ref 4.22–5.81)
RDW: 13.6 % (ref 11.5–15.5)
WBC: 3.3 10*3/uL — ABNORMAL LOW (ref 4.0–10.5)

## 2023-05-06 LAB — BASIC METABOLIC PANEL
BUN: 17 mg/dL (ref 6–23)
CO2: 32 meq/L (ref 19–32)
Calcium: 9.3 mg/dL (ref 8.4–10.5)
Chloride: 104 meq/L (ref 96–112)
Creatinine, Ser: 0.97 mg/dL (ref 0.40–1.50)
GFR: 72.7 mL/min (ref >60.00)
Glucose, Bld: 102 mg/dL — ABNORMAL HIGH (ref 70–99)
Potassium: 3.6 meq/L (ref 3.5–5.1)
Sodium: 142 meq/L (ref 135–145)

## 2023-05-06 NOTE — Progress Notes (Signed)
 Established Patient Office Visit   Subjective:  Patient ID: [name], male    DOB: [birth year]  Age: 83 y.o. MRN: [id number]  Chief Complaint  Patient presents with   Medical Management of Chronic Issues    6 month follow up. Pt is not fasting. Had a ensure for breakfast.     HPI Encounter Diagnoses  Name Primary?   Essential hypertension Yes   Anxiety state    Hyperlipidemia, unspecified hyperlipidemia type    For routine follow-up.  Doing well.  Weight has been stable at home at 150 pounds.  Drinking Ensure 4 times daily.  He is active physically by walking on his treadmill 3-4 times weekly.  Mood is stable especially with Lexapro .  Continues simvastatin  20 mg for control of cholesterol.  Blood pressure controlled with 100 mg of losartan .  Continues levothyroxine  75 mcg for hypothyroidism.  No history of kidney stones.   Review of Systems  Constitutional: Negative.   HENT: Negative.    Eyes:  Negative for blurred vision, discharge and redness.  Respiratory: Negative.    Cardiovascular: Negative.   Gastrointestinal:  Negative for abdominal pain.  Genitourinary: Negative.   Musculoskeletal: Negative.  Negative for myalgias.  Skin:  Negative for rash.  Neurological:  Negative for tingling, loss of consciousness and weakness.  Endo/Heme/Allergies:  Negative for polydipsia.     Current Outpatient Medications:    dorzolamide -timolol  (COSOPT ) 22.3-6.8 MG/ML ophthalmic solution, Place 1 drop into both eyes 2 (two) times daily., Disp: , Rfl:    doxazosin  (CARDURA ) 2 MG tablet, TAKE 1 TABLET BY MOUTH DAILY, Disp: 90 tablet, Rfl: 1   escitalopram  (LEXAPRO ) 20 MG tablet, Take 1 tablet (20 mg total) by mouth daily., Disp: 180 tablet, Rfl: 0   fluticasone  (FLONASE ) 50 MCG/ACT nasal spray, Place 1 spray into both nostrils daily as needed for allergies or rhinitis., Disp: , Rfl:    ipratropium (ATROVENT ) 0.03 % nasal spray, SPRAY TWO SPRAYS INTO BOTH NOSTRILS EVERY 12 HOURS, Disp:  30 mL, Rfl: 3   latanoprost  (XALATAN ) 0.005 % ophthalmic solution, Place 1 drop into both eyes at bedtime., Disp: , Rfl:    levothyroxine  (SYNTHROID ) 75 MCG tablet, TAKE 1 TABLET BY MOUTH DAILY, Disp: 90 tablet, Rfl: 3   losartan  (COZAAR ) 100 MG tablet, TAKE 1 TABLET BY MOUTH DAILY, Disp: 90 tablet, Rfl: 3   Nutritional Supplements (ENSURE ACTIVE PO), TAKE 3 TO 4 CARTONS ANY FLAVOR BY MOUTH DAILY, Disp: , Rfl:    simvastatin  (ZOCOR ) 20 MG tablet, TAKE ONE TABLET BY MOUTH EVERY EVENING, Disp: 90 tablet, Rfl: 3   nystatin  (MYCOSTATIN ) 100000 UNIT/ML suspension, Take 5 mLs (500,000 Units total) by mouth 4 (four) times daily. (Patient not taking: Reported on 03/08/2023), Disp: 200 mL, Rfl: 0   nystatin  (MYCOSTATIN ) 100000 UNIT/ML suspension, Take 5 mLs (500,000 Units total) by mouth 4 (four) times daily. (Patient not taking: Reported on 03/08/2023), Disp: 200 mL, Rfl: 0   Objective:     BP 118/78   Pulse 78   Temp 98 F (36.7 C)   Ht 5' 9 (1.753 m)   Wt 154 lb 12.8 oz (70.2 kg)   SpO2 99%   BMI 22.86 kg/m  Wt Readings from Last 3 Encounters:  05/06/23 154 lb 12.8 oz (70.2 kg)  01/13/23 150 lb (68 kg)  01/06/23 151 lb 9.6 oz (68.8 kg)      Physical Exam Constitutional:      General: He is not in acute distress.  Appearance: Normal appearance. He is not ill-appearing, toxic-appearing or diaphoretic.  HENT:     Head: Normocephalic and atraumatic.     Right Ear: External ear normal.     Left Ear: External ear normal.     Mouth/Throat:     Mouth: Mucous membranes are moist.     Pharynx: Oropharynx is clear. No oropharyngeal exudate or posterior oropharyngeal erythema.  Eyes:     General: No scleral icterus.       Right eye: No discharge.        Left eye: No discharge.     Extraocular Movements: Extraocular movements intact.     Conjunctiva/sclera: Conjunctivae normal.     Pupils: Pupils are equal, round, and reactive to light.  Cardiovascular:     Rate and Rhythm: Normal rate  and regular rhythm.  Pulmonary:     Effort: Pulmonary effort is normal. No respiratory distress.     Breath sounds: Normal breath sounds.  Abdominal:     General: Bowel sounds are normal.     Tenderness: There is no abdominal tenderness. There is no right CVA tenderness, left CVA tenderness, guarding or rebound.  Musculoskeletal:     Cervical back: No rigidity or tenderness.  Lymphadenopathy:     Cervical: No cervical adenopathy.  Skin:    General: Skin is warm and dry.  Neurological:     Mental Status: He is alert and oriented to person, place, and time.  Psychiatric:        Mood and Affect: Mood normal.        Behavior: Behavior normal.      No results found for any visits on 05/06/23.    The ASCVD Risk score (Arnett DK, et al., 2019) failed to calculate for the following reasons:   The 2019 ASCVD risk score is only valid for ages 68 to 71    Assessment & Plan:   Essential hypertension -     CBC -     Basic metabolic panel  Anxiety state  Hyperlipidemia, unspecified hyperlipidemia type    Return in about 6 months (around 11/03/2023).  Continue all medications as above.  [name], MD

## 2023-05-24 ENCOUNTER — Encounter: Payer: Self-pay | Admitting: Family Medicine

## 2023-05-24 ENCOUNTER — Ambulatory Visit (INDEPENDENT_AMBULATORY_CARE_PROVIDER_SITE_OTHER): Payer: Medicare HMO | Admitting: Family Medicine

## 2023-05-24 VITALS — BP 118/82 | HR 68 | Temp 98.0°F | Ht 69.0 in | Wt 155.0 lb

## 2023-05-24 DIAGNOSIS — Y842 Radiological procedure and radiotherapy as the cause of abnormal reaction of the patient, or of later complication, without mention of misadventure at the time of the procedure: Secondary | ICD-10-CM

## 2023-05-24 DIAGNOSIS — K121 Other forms of stomatitis: Secondary | ICD-10-CM | POA: Diagnosis not present

## 2023-05-24 DIAGNOSIS — I10 Essential (primary) hypertension: Secondary | ICD-10-CM

## 2023-05-24 DIAGNOSIS — K117 Disturbances of salivary secretion: Secondary | ICD-10-CM | POA: Diagnosis not present

## 2023-05-24 MED ORDER — TRIAMCINOLONE ACETONIDE 0.1 % MT PSTE: 1.0000 | PASTE | Freq: Two times a day (BID) | OROMUCOSAL | 1 refills | Status: DC

## 2023-05-24 NOTE — Progress Notes (Signed)
Established Patient Office Visit   Subjective:  Patient ID: [name], male    DOB: Oct 29, 1940  Age: 83 y.o. MRN: [id number]  Chief Complaint  Patient presents with   Ulcer    Pt wants a new referral to ENT. Pt old doctor is in [place] and will be out for 2 months.     HPI Encounter Diagnoses  Name Primary?   Oral ulcer Yes   Essential hypertension    Xerostomia due to radiotherapy    For follow-up of above.  Chronic ulceration of right Palatino arch.  Has follow-up with ENT next month.  A biopsy has been discussed.  Blood pressure well-controlled with losartan.   Review of Systems  Constitutional: Negative.   HENT: Negative.    Eyes:  Negative for blurred vision, discharge and redness.  Respiratory: Negative.    Cardiovascular: Negative.   Gastrointestinal:  Negative for abdominal pain.  Genitourinary: Negative.   Musculoskeletal: Negative.  Negative for myalgias.  Skin:  Negative for rash.  Neurological:  Negative for tingling, loss of consciousness and weakness.  Endo/Heme/Allergies:  Negative for polydipsia.     Current Outpatient Medications:    dorzolamide-timolol (COSOPT) 22.3-6.8 MG/ML ophthalmic solution, Place 1 drop into both eyes 2 (two) times daily., Disp: , Rfl:    doxazosin (CARDURA) 2 MG tablet, TAKE 1 TABLET BY MOUTH DAILY, Disp: 90 tablet, Rfl: 1   escitalopram (LEXAPRO) 20 MG tablet, Take 1 tablet (20 mg total) by mouth daily., Disp: 180 tablet, Rfl: 0   fluticasone (FLONASE) 50 MCG/ACT nasal spray, Place 1 spray into both nostrils daily as needed for allergies or rhinitis., Disp: , Rfl:    ipratropium (ATROVENT) 0.03 % nasal spray, SPRAY TWO SPRAYS INTO BOTH NOSTRILS EVERY 12 HOURS, Disp: 30 mL, Rfl: 3   latanoprost (XALATAN) 0.005 % ophthalmic solution, Place 1 drop into both eyes at bedtime., Disp: , Rfl:    levothyroxine (SYNTHROID) 75 MCG tablet, TAKE 1 TABLET BY MOUTH DAILY, Disp: 90 tablet, Rfl: 3   losartan (COZAAR) 100 MG tablet, TAKE 1  TABLET BY MOUTH DAILY, Disp: 90 tablet, Rfl: 3   Nutritional Supplements (ENSURE ACTIVE PO), TAKE 3 TO 4 CARTONS ANY FLAVOR BY MOUTH DAILY, Disp: , Rfl:    simvastatin (ZOCOR) 20 MG tablet, TAKE ONE TABLET BY MOUTH EVERY EVENING, Disp: 90 tablet, Rfl: 3   triamcinolone (KENALOG) 0.1 % paste, Use as directed 1 Application in the mouth or throat 2 (two) times daily., Disp: 5 g, Rfl: 1   Objective:     BP 118/82   Pulse 68   Temp 98 F (36.7 C)   Ht 5\' 9"  (1.753 m)   Wt 155 lb (70.3 kg)   SpO2 99%   BMI 22.89 kg/m    Physical Exam Constitutional:      General: He is not in acute distress.    Appearance: Normal appearance. He is not ill-appearing, toxic-appearing or diaphoretic.  HENT:     Head: Normocephalic and atraumatic.     Right Ear: External ear normal.     Left Ear: External ear normal.     Mouth/Throat:   Eyes:     General: No scleral icterus.       Right eye: No discharge.        Left eye: No discharge.     Extraocular Movements: Extraocular movements intact.     Conjunctiva/sclera: Conjunctivae normal.  Pulmonary:     Effort: Pulmonary effort is normal. No respiratory distress.  Skin:    General: Skin is warm and dry.  Neurological:     Mental Status: He is alert and oriented to person, place, and time.  Psychiatric:        Mood and Affect: Mood normal.        Behavior: Behavior normal.      No results found for any visits on 05/24/23.    The ASCVD Risk score (Arnett DK, et al., 2019) failed to calculate for the following reasons:   The 2019 ASCVD risk score is only valid for ages 34 to 36    Assessment & Plan:   Oral ulcer -     Triamcinolone Acetonide; Use as directed 1 Application in the mouth or throat 2 (two) times daily.  Dispense: 5 g; Refill: 1  Essential hypertension  Xerostomia due to radiotherapy    Return Follow-up with ENT as planned..  Will use Oralone twice daily for symptom relief until ENT visit.  Continue losartan.   Otherwise, follow-up in July as planned.  Mliss Sax, MD

## 2023-05-27 ENCOUNTER — Other Ambulatory Visit: Payer: Self-pay | Admitting: Family Medicine

## 2023-05-27 ENCOUNTER — Telehealth: Payer: Self-pay | Admitting: Family Medicine

## 2023-05-27 DIAGNOSIS — K121 Other forms of stomatitis: Secondary | ICD-10-CM

## 2023-05-27 MED ORDER — LIDOCAINE VISCOUS HCL 2 % MT SOLN: 5.0000 mL | Freq: Four times a day (QID) | OROMUCOSAL | 0 refills | Status: DC

## 2023-05-27 NOTE — Telephone Encounter (Signed)
Copied from CRM [id number]. Topic: Clinical - Medication Refill >> May 27, 2023  3:37 PM Florestine Avers wrote: Most Recent Primary Care Visit:  Provider: Mliss Sax  Department: [facility]  Visit Type: OFFICE VISIT  Date: 05/06/2023  Medication: magic mouthwash (lidocaine, diphenhydrAMINE, alum & mag hydroxide) suspension  Has the patient contacted their pharmacy? Yes (Agent: If no, request that the patient contact the pharmacy for the refill. If patient does not wish to contact the pharmacy document the reason why and proceed with request.) (Agent: If yes, when and what did the pharmacy advise?)  Is this the correct pharmacy for this prescription? Yes If no, delete pharmacy and type the correct one.  This is the patient's preferred pharmacy:  [facility] - [place], [place] - [address] [facility] [address] [place] [address] Phone: [phone] Fax: [phone]    Has the prescription been filled recently? Yes, medication was sent to the wrong pharmacy.   Is the patient out of the medication? Yes  Has the patient been seen for an appointment in the last year OR does the patient have an upcoming appointment? Yes  Can we respond through MyChart? Yes  Agent: Please be advised that Rx refills may take up to 3 business days. We ask that you follow-up with your pharmacy.

## 2023-05-27 NOTE — Addendum Note (Signed)
Addended by: Andrez Grime on: 05/27/2023 03:13 PM   Modules accepted: Orders

## 2023-05-27 NOTE — Telephone Encounter (Signed)
Pt was seen by Dr Doreene Burke on 05/24/23 for oral ulcers. They are very painful and he would like to try Magic mouth wash.   Send to [address] [facility] Address: [address] [place], [place], [place] [address] Phone: [phone]  Pt at [phone]

## 2023-05-28 MED ORDER — LIDOCAINE VISCOUS HCL 2 % MT SOLN: 5.0000 mL | Freq: Four times a day (QID) | OROMUCOSAL | 0 refills | Status: AC

## 2023-05-31 DIAGNOSIS — H524 Presbyopia: Secondary | ICD-10-CM | POA: Diagnosis not present

## 2023-05-31 DIAGNOSIS — H52223 Regular astigmatism, bilateral: Secondary | ICD-10-CM | POA: Diagnosis not present

## 2023-06-12 DIAGNOSIS — Z008 Encounter for other general examination: Secondary | ICD-10-CM | POA: Diagnosis not present

## 2023-06-18 DIAGNOSIS — Z85819 Personal history of malignant neoplasm of unspecified site of lip, oral cavity, and pharynx: Secondary | ICD-10-CM | POA: Diagnosis not present

## 2023-06-18 DIAGNOSIS — R682 Dry mouth, unspecified: Secondary | ICD-10-CM | POA: Diagnosis not present

## 2023-06-18 DIAGNOSIS — R1319 Other dysphagia: Secondary | ICD-10-CM | POA: Diagnosis not present

## 2023-06-18 DIAGNOSIS — J342 Deviated nasal septum: Secondary | ICD-10-CM | POA: Diagnosis not present

## 2023-07-16 DIAGNOSIS — R1313 Dysphagia, pharyngeal phase: Secondary | ICD-10-CM | POA: Diagnosis not present

## 2023-07-16 DIAGNOSIS — K1370 Unspecified lesions of oral mucosa: Secondary | ICD-10-CM | POA: Diagnosis not present

## 2023-07-21 NOTE — Progress Notes (Unsigned)
 Cardiology Office Note:  .   Date:  07/22/2023  ID:  [name], DOB [birth year], MRN [id number] PCP: Mliss Sax, MD  [facility] Providers Cardiologist:  None { History of Present Illness: .    Chief Complaint  Patient presents with   Abnormal ECG         Reo [name] is a 83 y.o. male with history of HTN, HLD who presents for the evaluation of palpitations at the request of Mliss Sax, MD.   History of Present Illness   [name] is an 83 year old male with hypertension and hyperlipidemia who presents with irregular heart beat. He was referred by his [employer], Community education officer, for evaluation of irregular heartbeats.  He wore a heart monitor for two weeks as part of an insurance assessment, which indicated two abnormalities described as irregular heartbeats. He received a letter stating these were not concerning but was advised to follow up with a cardiologist for further evaluation.   He denies CP or SOB. No palpitations or irregular heart beat.   He experiences regular breathing difficulties due to a deviated septum and sinus issues, requiring him to breathe through his mouth frequently. These issues are chronic and unrelated to his current cardiac concerns.  His past medical history includes well-controlled hypertension managed with losartan 100 mg daily and hyperlipidemia managed with simvastatin. He has no history of heart attack, stroke, or diabetes. He has undergone multiple surgeries, none of which were cardiac-related.  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 He is married with three children and seven grandchildren.       TSH 1.09    Problem List HTN HLD -T chol 98, HDL 54, LDL 40, TG 68 1AVB    ROS: All other ROS reviewed and negative. Pertinent positives noted in the HPI.      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 Confirmed by Lennie Odor [phone]) on 07/22/2023 2:20:19 PM   Physical Exam:   VS:  BP 130/88 (BP Location: Left Arm, Patient Position: Sitting, Cuff Size: Normal)   Pulse (!) 59   Ht 5\' 9"  (1.753 m)   Wt 155 lb (70.3 kg)   BMI 22.89 kg/m    Wt Readings from Last 3 Encounters:  07/22/23 155 lb (70.3 kg)  05/24/23 155 lb (70.3 kg)  05/06/23 154 lb 12.8 oz (70.2 kg)    GEN: Well nourished, well developed in no acute distress NECK: No JVD; No carotid bruits CARDIAC: RRR, no murmurs, rubs, gallops RESPIRATORY:  Clear to auscultation without rales, wheezing or rhonchi  ABDOMEN: Soft, non-tender, non-distended EXTREMITIES:  No edema; No deformity  ASSESSMENT AND PLAN: .   Assessment and Plan    Palpitations Referred for evaluation after irregular heartbeats detected. Two-week monitor showed abnormalities. EKG: first-degree AV block with sinus bradycardia. Asymptomatic, stable condition. - Monitor for new symptoms such as chest pain or dyspnea. - Reevaluate if new symptoms develop. - no further work-up needed.   Hypertension Well-controlled with losartan 100 mg daily. Blood pressure stable.  Hyperlipidemia Well-controlled with simvastatin. LDL levels within target range.  Follow-up Current cardiac status stable, no further testing or immediate follow-up required. - Advise to contact the clinic if new symptoms such as chest pain or dyspnea occur.              Follow-up: Return if symptoms worsen or fail to improve.   Signed, Lenna Gilford. Flora Lipps, MD, [facility]  [facility]  [address], [address] [place], [place] [address] [phone]  2:33 PM

## 2023-07-22 ENCOUNTER — Encounter: Payer: Self-pay | Admitting: Cardiovascular Disease

## 2023-07-22 ENCOUNTER — Ambulatory Visit: Attending: Cardiovascular Disease | Admitting: Cardiovascular Disease

## 2023-07-22 VITALS — BP 130/88 | HR 59 | Ht 69.0 in | Wt 155.0 lb

## 2023-07-22 DIAGNOSIS — I150 Renovascular hypertension: Secondary | ICD-10-CM | POA: Diagnosis not present

## 2023-07-22 DIAGNOSIS — I440 Atrioventricular block, first degree: Secondary | ICD-10-CM

## 2023-07-22 DIAGNOSIS — R9431 Abnormal electrocardiogram [ECG] [EKG]: Secondary | ICD-10-CM | POA: Diagnosis not present

## 2023-07-22 DIAGNOSIS — R002 Palpitations: Secondary | ICD-10-CM

## 2023-07-22 DIAGNOSIS — E782 Mixed hyperlipidemia: Secondary | ICD-10-CM

## 2023-07-22 NOTE — Patient Instructions (Signed)
 Medication Instructions:  Your physician recommends that you continue on your current medications as directed. Please refer to the Current Medication list given to you today.    *If you need a refill on your cardiac medications before your next appointment, please call your pharmacy*   Lab Work: None    If you have labs (blood work) drawn today and your tests are completely normal, you will receive your results only by: MyChart Message (if you have MyChart) OR A paper copy in the mail If you have any lab test that is abnormal or we need to change your treatment, we will call you to review the results.   Testing/Procedures: None    Follow-Up: At [facility], you and your health needs are our priority.  As part of our continuing mission to provide you with exceptional heart care, we have created designated Provider Care Teams.  These Care Teams include your primary Cardiologist (physician) and Advanced Practice Providers (APPs -  Physician Assistants and Nurse Practitioners) who all work together to provide you with the care you need, when you need it.  We recommend signing up for the patient portal called "MyChart".  Sign up information is provided on this After Visit Summary.  MyChart is used to connect with patients for Virtual Visits (Telemedicine).  Patients are able to view lab/test results, encounter notes, upcoming appointments, etc.  Non-urgent messages can be sent to your provider as well.   To learn more about what you can do with MyChart, go to [email or web address].    Your next appointment:   Follow up as needed    Provider:   Lennie Odor, MD   Other Instructions

## 2023-08-18 DIAGNOSIS — L570 Actinic keratosis: Secondary | ICD-10-CM | POA: Diagnosis not present

## 2023-08-18 DIAGNOSIS — C44311 Basal cell carcinoma of skin of nose: Secondary | ICD-10-CM | POA: Diagnosis not present

## 2023-08-18 DIAGNOSIS — C44219 Basal cell carcinoma of skin of left ear and external auricular canal: Secondary | ICD-10-CM | POA: Diagnosis not present

## 2023-08-18 DIAGNOSIS — D692 Other nonthrombocytopenic purpura: Secondary | ICD-10-CM | POA: Diagnosis not present

## 2023-08-18 DIAGNOSIS — L821 Other seborrheic keratosis: Secondary | ICD-10-CM | POA: Diagnosis not present

## 2023-08-18 DIAGNOSIS — D485 Neoplasm of uncertain behavior of skin: Secondary | ICD-10-CM | POA: Diagnosis not present

## 2023-08-18 DIAGNOSIS — D3611 Benign neoplasm of peripheral nerves and autonomic nervous system of face, head, and neck: Secondary | ICD-10-CM | POA: Diagnosis not present

## 2023-08-18 DIAGNOSIS — D225 Melanocytic nevi of trunk: Secondary | ICD-10-CM | POA: Diagnosis not present

## 2023-08-27 DIAGNOSIS — R682 Dry mouth, unspecified: Secondary | ICD-10-CM | POA: Diagnosis not present

## 2023-08-27 DIAGNOSIS — Z85819 Personal history of malignant neoplasm of unspecified site of lip, oral cavity, and pharynx: Secondary | ICD-10-CM | POA: Diagnosis not present

## 2023-09-14 DIAGNOSIS — C4442 Squamous cell carcinoma of skin of scalp and neck: Secondary | ICD-10-CM | POA: Diagnosis not present

## 2023-09-14 DIAGNOSIS — L820 Inflamed seborrheic keratosis: Secondary | ICD-10-CM | POA: Diagnosis not present

## 2023-09-14 DIAGNOSIS — C44219 Basal cell carcinoma of skin of left ear and external auricular canal: Secondary | ICD-10-CM | POA: Diagnosis not present

## 2023-09-14 DIAGNOSIS — L570 Actinic keratosis: Secondary | ICD-10-CM | POA: Diagnosis not present

## 2023-09-14 DIAGNOSIS — D485 Neoplasm of uncertain behavior of skin: Secondary | ICD-10-CM | POA: Diagnosis not present

## 2023-09-16 ENCOUNTER — Other Ambulatory Visit: Payer: Self-pay | Admitting: Family Medicine

## 2023-09-16 DIAGNOSIS — F411 Generalized anxiety disorder: Secondary | ICD-10-CM

## 2023-09-16 MED ORDER — ESCITALOPRAM OXALATE 20 MG PO TABS: 20.0000 mg | ORAL_TABLET | Freq: Every day | ORAL | 1 refills | Status: DC

## 2023-09-16 NOTE — Addendum Note (Signed)
 Addended by: Delene Feinstein on: 09/16/2023 04:48 PM   Modules accepted: Orders

## 2023-09-23 ENCOUNTER — Other Ambulatory Visit: Payer: Self-pay | Admitting: Family Medicine

## 2023-09-23 DIAGNOSIS — I10 Essential (primary) hypertension: Secondary | ICD-10-CM

## 2023-10-14 DIAGNOSIS — H401132 Primary open-angle glaucoma, bilateral, moderate stage: Secondary | ICD-10-CM | POA: Diagnosis not present

## 2023-10-14 DIAGNOSIS — H52223 Regular astigmatism, bilateral: Secondary | ICD-10-CM | POA: Diagnosis not present

## 2023-10-14 DIAGNOSIS — H04123 Dry eye syndrome of bilateral lacrimal glands: Secondary | ICD-10-CM | POA: Diagnosis not present

## 2023-10-14 DIAGNOSIS — Z961 Presence of intraocular lens: Secondary | ICD-10-CM | POA: Diagnosis not present

## 2023-11-04 ENCOUNTER — Ambulatory Visit: Payer: Self-pay | Admitting: Family Medicine

## 2023-11-04 ENCOUNTER — Ambulatory Visit: Payer: Medicare HMO | Admitting: Family Medicine

## 2023-11-04 ENCOUNTER — Encounter: Payer: Self-pay | Admitting: Family Medicine

## 2023-11-04 VITALS — BP 122/72 | HR 63 | Temp 98.2°F | Ht 65.0 in | Wt 153.2 lb

## 2023-11-04 DIAGNOSIS — E785 Hyperlipidemia, unspecified: Secondary | ICD-10-CM | POA: Diagnosis not present

## 2023-11-04 DIAGNOSIS — I10 Essential (primary) hypertension: Secondary | ICD-10-CM

## 2023-11-04 DIAGNOSIS — Z131 Encounter for screening for diabetes mellitus: Secondary | ICD-10-CM | POA: Diagnosis not present

## 2023-11-04 DIAGNOSIS — D649 Anemia, unspecified: Secondary | ICD-10-CM

## 2023-11-04 DIAGNOSIS — E039 Hypothyroidism, unspecified: Secondary | ICD-10-CM

## 2023-11-04 DIAGNOSIS — F411 Generalized anxiety disorder: Secondary | ICD-10-CM

## 2023-11-04 LAB — URINALYSIS, ROUTINE W REFLEX MICROSCOPIC
Bilirubin Urine: NEGATIVE
Hgb urine dipstick: NEGATIVE
Ketones, ur: NEGATIVE
Leukocytes,Ua: NEGATIVE
Nitrite: NEGATIVE
Specific Gravity, Urine: 1.01 (ref 1.000–1.030)
Total Protein, Urine: NEGATIVE
Urine Glucose: NEGATIVE
Urobilinogen, UA: 4 — AB (ref 0.0–1.0)
pH: 7 (ref 5.0–8.0)

## 2023-11-04 LAB — COMPREHENSIVE METABOLIC PANEL WITH GFR
ALT: 8 U/L (ref 0–53)
AST: 13 U/L (ref 0–37)
Albumin: 4.2 g/dL (ref 3.5–5.2)
Alkaline Phosphatase: 75 U/L (ref 39–117)
BUN: 11 mg/dL (ref 6–23)
CO2: 35 meq/L — ABNORMAL HIGH (ref 19–32)
Calcium: 9.2 mg/dL (ref 8.4–10.5)
Chloride: 105 meq/L (ref 96–112)
Creatinine, Ser: 1 mg/dL (ref 0.40–1.50)
GFR: 69.85 mL/min (ref >60.00)
Glucose, Bld: 91 mg/dL (ref 70–99)
Potassium: 3.6 meq/L (ref 3.5–5.1)
Sodium: 143 meq/L (ref 135–145)
Total Bilirubin: 0.9 mg/dL (ref 0.2–1.2)
Total Protein: 6.8 g/dL (ref 6.0–8.3)

## 2023-11-04 LAB — CBC
HCT: 36.6 % — ABNORMAL LOW (ref 39.0–52.0)
Hemoglobin: 12.6 g/dL — ABNORMAL LOW (ref 13.0–17.0)
MCHC: 34.3 g/dL (ref 30.0–36.0)
MCV: 96.1 fl (ref 78.0–100.0)
Platelets: 121 10*3/uL — ABNORMAL LOW (ref 150.0–400.0)
RBC: 3.81 Mil/uL — ABNORMAL LOW (ref 4.22–5.81)
RDW: 13.7 % (ref 11.5–15.5)
WBC: 2.9 10*3/uL — ABNORMAL LOW (ref 4.0–10.5)

## 2023-11-04 LAB — IRON,TIBC AND FERRITIN PANEL
%SAT: 45 % (ref 20–48)
Ferritin: 101 ng/mL (ref 24–380)
Iron: 102 ug/dL (ref 50–180)
TIBC: 229 ug/dL — ABNORMAL LOW (ref 250–425)

## 2023-11-04 LAB — LIPID PANEL
Cholesterol: 101 mg/dL (ref 0–200)
HDL: 57 mg/dL (ref >39.00)
LDL Cholesterol: 32 mg/dL (ref 0–99)
NonHDL: 43.89
Total CHOL/HDL Ratio: 2
Triglycerides: 57 mg/dL (ref 0.0–149.0)
VLDL: 11.4 mg/dL (ref 0.0–40.0)

## 2023-11-04 LAB — B12 AND FOLATE PANEL
Folate: 15.9 ng/mL (ref >5.9)
Vitamin B-12: 618 pg/mL (ref 211–911)

## 2023-11-04 LAB — TSH: TSH: 1.25 u[IU]/mL (ref 0.35–5.50)

## 2023-11-04 LAB — HEMOGLOBIN A1C: Hgb A1c MFr Bld: 5.4 % (ref 4.6–6.5)

## 2023-11-04 NOTE — Progress Notes (Signed)
 Established Patient Office Visit   Subjective:  Patient ID: [name], male    DOB: 03-13-1941  Age: 83 y.o. MRN: [id number]  Chief Complaint  Patient presents with   Follow-up    Iron levels    HPI Encounter Diagnoses  Name Primary?   Essential hypertension Yes   Anxiety state    Hyperlipidemia, unspecified hyperlipidemia type    Hypothyroidism, unspecified type    Normocytic anemia    Screening for diabetes mellitus    For follow-up of the above.  Things have been stable for him.  Weight has been stable.  Has been dealing with xerosis and dry skin.  Continues close follow-up with ENT.   Review of Systems  Constitutional: Negative.   HENT: Negative.    Eyes:  Negative for blurred vision, discharge and redness.  Respiratory: Negative.    Cardiovascular: Negative.   Gastrointestinal:  Negative for abdominal pain.  Genitourinary: Negative.   Musculoskeletal: Negative.  Negative for myalgias.  Skin:  Negative for rash.  Neurological:  Negative for tingling, loss of consciousness and weakness.  Endo/Heme/Allergies:  Negative for polydipsia.     Current Outpatient Medications:    dorzolamide -timolol  (COSOPT ) 22.3-6.8 MG/ML ophthalmic solution, Place 1 drop into both eyes 2 (two) times daily., Disp: , Rfl:    doxazosin  (CARDURA ) 2 MG tablet, TAKE 1 TABLET BY MOUTH DAILY, Disp: 90 tablet, Rfl: 1   escitalopram  (LEXAPRO ) 20 MG tablet, Take 1 tablet (20 mg total) by mouth daily., Disp: 90 tablet, Rfl: 1   fluticasone  (FLONASE ) 50 MCG/ACT nasal spray, Place 1 spray into both nostrils daily as needed for allergies or rhinitis., Disp: , Rfl:    ipratropium (ATROVENT ) 0.03 % nasal spray, SPRAY TWO SPRAYS INTO BOTH NOSTRILS EVERY 12 HOURS, Disp: 30 mL, Rfl: 3   latanoprost  (XALATAN ) 0.005 % ophthalmic solution, Place 1 drop into both eyes at bedtime., Disp: , Rfl:    levothyroxine  (SYNTHROID ) 75 MCG tablet, TAKE 1 TABLET BY MOUTH DAILY, Disp: 90 tablet, Rfl: 3   losartan   (COZAAR ) 100 MG tablet, TAKE 1 TABLET BY MOUTH DAILY, Disp: 90 tablet, Rfl: 3   Nutritional Supplements (ENSURE ACTIVE PO), TAKE 3 TO 4 CARTONS ANY FLAVOR BY MOUTH DAILY, Disp: , Rfl:    simvastatin  (ZOCOR ) 20 MG tablet, TAKE ONE TABLET BY MOUTH EVERY EVENING, Disp: 90 tablet, Rfl: 3   triamcinolone  (KENALOG ) 0.1 % paste, Use as directed 1 Application in the mouth or throat 2 (two) times daily., Disp: 5 g, Rfl: 1   Objective:     BP 122/72 (BP Location: Right Arm, Patient Position: Sitting)   Pulse 63   Temp 98.2 F (36.8 C) (Temporal)   Ht 5' 5 (1.651 m)   Wt 153 lb 3.2 oz (69.5 kg)   SpO2 96%   BMI 25.49 kg/m  Wt Readings from Last 3 Encounters:  11/04/23 153 lb 3.2 oz (69.5 kg)  07/22/23 155 lb (70.3 kg)  05/24/23 155 lb (70.3 kg)      Physical Exam Constitutional:      General: He is not in acute distress.    Appearance: Normal appearance. He is not ill-appearing, toxic-appearing or diaphoretic.  HENT:     Head: Normocephalic and atraumatic.     Right Ear: Tympanic membrane, ear canal and external ear normal.     Left Ear: Tympanic membrane, ear canal and external ear normal.     Mouth/Throat:     Mouth: Mucous membranes are moist.  Pharynx: Oropharynx is clear. No oropharyngeal exudate or posterior oropharyngeal erythema.  Eyes:     General: No scleral icterus.       Right eye: No discharge.        Left eye: No discharge.     Extraocular Movements: Extraocular movements intact.     Conjunctiva/sclera: Conjunctivae normal.     Pupils: Pupils are equal, round, and reactive to light.  Cardiovascular:     Rate and Rhythm: Normal rate and regular rhythm.  Pulmonary:     Effort: Pulmonary effort is normal. No respiratory distress.     Breath sounds: Normal breath sounds.  Abdominal:     General: Bowel sounds are normal.  Musculoskeletal:     Cervical back: No rigidity or tenderness.  Skin:    General: Skin is warm and dry.  Neurological:     Mental Status: He  is alert and oriented to person, place, and time.  Psychiatric:        Mood and Affect: Mood normal.        Behavior: Behavior normal.      No results found for any visits on 11/04/23.    The ASCVD Risk score (Arnett DK, et al., 2019) failed to calculate for the following reasons:   The 2019 ASCVD risk score is only valid for ages 53 to 20    Assessment & Plan:   Essential hypertension -     CBC -     Urinalysis, Routine w reflex microscopic -     Comprehensive metabolic panel with GFR  Anxiety state  Hyperlipidemia, unspecified hyperlipidemia type -     Lipid panel -     Comprehensive metabolic panel with GFR  Hypothyroidism, unspecified type -     TSH  Normocytic anemia -     CBC -     Iron, TIBC and Ferritin Panel -     B12 and Folate Panel  Screening for diabetes mellitus -     Hemoglobin A1c    Return in about 6 months (around 05/06/2024).  Use gentle soaps and lubricants such as Eucerin or Lubriderm.  [name], MD

## 2023-11-16 ENCOUNTER — Other Ambulatory Visit: Payer: Self-pay | Admitting: Family Medicine

## 2023-11-16 DIAGNOSIS — E039 Hypothyroidism, unspecified: Secondary | ICD-10-CM

## 2023-12-17 DIAGNOSIS — Z85819 Personal history of malignant neoplasm of unspecified site of lip, oral cavity, and pharynx: Secondary | ICD-10-CM | POA: Diagnosis not present

## 2023-12-20 DIAGNOSIS — D0421 Carcinoma in situ of skin of right ear and external auricular canal: Secondary | ICD-10-CM | POA: Diagnosis not present

## 2023-12-20 DIAGNOSIS — D485 Neoplasm of uncertain behavior of skin: Secondary | ICD-10-CM | POA: Diagnosis not present

## 2023-12-20 DIAGNOSIS — L570 Actinic keratosis: Secondary | ICD-10-CM | POA: Diagnosis not present

## 2023-12-20 DIAGNOSIS — L821 Other seborrheic keratosis: Secondary | ICD-10-CM | POA: Diagnosis not present

## 2023-12-20 DIAGNOSIS — L905 Scar conditions and fibrosis of skin: Secondary | ICD-10-CM | POA: Diagnosis not present

## 2024-03-02 DIAGNOSIS — Z961 Presence of intraocular lens: Secondary | ICD-10-CM | POA: Diagnosis not present

## 2024-03-02 DIAGNOSIS — H04123 Dry eye syndrome of bilateral lacrimal glands: Secondary | ICD-10-CM | POA: Diagnosis not present

## 2024-03-02 DIAGNOSIS — H401132 Primary open-angle glaucoma, bilateral, moderate stage: Secondary | ICD-10-CM | POA: Diagnosis not present

## 2024-03-10 DIAGNOSIS — M542 Cervicalgia: Secondary | ICD-10-CM | POA: Diagnosis not present

## 2024-03-10 DIAGNOSIS — M25511 Pain in right shoulder: Secondary | ICD-10-CM | POA: Diagnosis not present

## 2024-03-13 ENCOUNTER — Ambulatory Visit: Payer: Medicare HMO

## 2024-03-13 VITALS — Ht 69.0 in | Wt 155.0 lb

## 2024-03-13 DIAGNOSIS — Z0000 Encounter for general adult medical examination without abnormal findings: Secondary | ICD-10-CM

## 2024-03-13 NOTE — Patient Instructions (Addendum)
 Mr. Markuson,  Thank you for taking the time for your Medicare Wellness Visit. I appreciate your continued commitment to your health goals. Please review the care plan we discussed, and feel free to reach out if I can assist you further.  Please note that Annual Wellness Visits do not include a physical exam. Some assessments may be limited, especially if the visit was conducted virtually. If needed, we may recommend an in-person follow-up with your provider.  Ongoing Care Seeing your primary care provider every 3 to 6 months helps us  monitor your health and provide consistent, personalized care.   Referrals If a referral was made during today's visit and you haven't received any updates within two weeks, please contact the referred provider directly to check on the status.  Recommended Screenings:  Health Maintenance  Topic Date Due   Medicare Annual Wellness Visit  03/13/2025   DTaP/Tdap/Td vaccine (3 - Td or Tdap) 05/08/2032   Pneumococcal Vaccine for age over 107  Completed   Flu Shot  Completed   Zoster (Shingles) Vaccine  Completed   Meningitis B Vaccine  Aged Out   COVID-19 Vaccine  Discontinued       03/13/2024    8:43 AM  Advanced Directives  Does Patient Have a Medical Advance Directive? Yes  Type of Estate Agent of [place];Living will  Does patient want to make changes to medical advance directive? No - Patient declined  Copy of Healthcare Power of Attorney in Chart? Yes - validated most recent copy scanned in chart (See row information)    Vision: Annual vision screenings are recommended for early detection of glaucoma, cataracts, and diabetic retinopathy. These exams can also reveal signs of chronic conditions such as diabetes and high blood pressure.  Dental: Annual dental screenings help detect early signs of oral cancer, gum disease, and other conditions linked to overall health, including heart disease and diabetes.  Please see the attached  documents for additional preventive care recommendations.

## 2024-03-13 NOTE — Progress Notes (Cosign Needed Addendum)
 I connected with  [name] on 03/13/24 by a video and audio enabled telemedicine application and verified that I am speaking with the correct person using two identifiers.  Persons participating in visit: patient and Derek Dawn LPN  Patient Location: Home  Provider Location: Office/Clinic  I discussed the limitations of evaluation and management by telemedicine. The patient expressed understanding and agreed to proceed.   Subjective:   [name] is a 83 y.o. male who presents for a Medicare Annual Wellness Visit.  Allergies (verified) Travoprost, Amlodipine , Lumigan [bimatoprost], Combigan  [brimonidine  tartrate-timolol ], and Codeine   History: Past Medical History:  Diagnosis Date   Anxiety state 08/05/2021   Arthritis    BPH (benign prostatic hyperplasia)    Bruises easily    Cancer (HCC)    throat   Complication of anesthesia    difficult urination after anesthesia   Dizziness    GERD (gastroesophageal reflux disease)    denies   Glaucoma    left worse than right   H/O hiatal hernia    Heart murmur    pt. reports  doctors can't hear it   Hyperlipemia    Hypertension    takes meds daily   Hypothyroidism    Transient alteration of awareness    ~ 12/27/17, evaluated by neurologist Dr. Vear 01/06/18   Urinary retention    Past Surgical History:  Procedure Laterality Date   ANTERIOR LAT LUMBAR FUSION  03/11/2012   Procedure: ANTERIOR LATERAL LUMBAR FUSION 1 LEVEL;  Surgeon: Victory DELENA Gunnels, MD;  Location: [facility];  Service: Neurosurgery;  Laterality: Left;  Left lumbar four-five extreme lumbar interbody fusion with percutaneous pedicle screws    BACK SURGERY     ruptured disc repair   COLONOSCOPY W/ POLYPECTOMY     EYE SURGERY     left cataract   HERNIA REPAIR     INGUINAL HERNIA REPAIR Right 05/22/2014   Procedure: REPAIR RECURRENT RIGHT INGUINAL HERNIA;  Surgeon: Gordy Pina, MD;  Location: [facility] OR;  Service: General;  Laterality: Right;    INGUINAL HERNIA REPAIR Right 10/01/2021   Procedure: LAPAROSCOPIC INGUINAL HERNIA REPAIR;  Surgeon: Rubin Calamity, MD;  Location: [facility] OR;  Service: General;  Laterality: Right;   INSERTION OF MESH Right 05/22/2014   Procedure: INSERTION OF MESH;  Surgeon: Gordy Pina, MD;  Location: [facility] OR;  Service: General;  Laterality: Right;   INSERTION OF MESH Left 08/06/2020   Procedure: INSERTION OF MESH;  Surgeon: Rubin Calamity, MD;  Location: [facility] OR;  Service: General;  Laterality: Left;   INSERTION OF MESH Right 10/01/2021   Procedure: INSERTION OF MESH;  Surgeon: Rubin Calamity, MD;  Location: [facility] OR;  Service: General;  Laterality: Right;   LUMBAR LAMINECTOMY/DECOMPRESSION MICRODISCECTOMY Left 01/11/2018   Procedure: Laminectomy and Foraminotomy - Left - Lumbar Three-Lumbar Four - Lumbar Five-Sacral One;  Surgeon: Gunnels Victory, MD;  Location: [facility] OR;  Service: Neurosurgery;  Laterality: Left;  Laminectomy and Foraminotomy - Left - Lumbar Three-Lumbar Four - Lumbar Five-Sacral One   LUMBAR PERCUTANEOUS PEDICLE SCREW 1 LEVEL  03/11/2012   Procedure: LUMBAR PERCUTANEOUS PEDICLE SCREW 1 LEVEL;  Surgeon: Victory DELENA Gunnels, MD;  Location: [facility];  Service: Neurosurgery;  Laterality: Left;  Left lumbar four-five extreme lumbar interbody fusion with percutaneous pedicle screws    RADICAL NECK DISSECTION  05/05/1987   ROTATOR CUFF REPAIR     right   TONSILLECTOMY     XI ROBOTIC ASSISTED VENTRAL HERNIA N/A 08/06/2020  Procedure: XI ROBOTIC ASSISTED INCISIONAL FLANK  HERNIA REPAIR WITH MESH;  Surgeon: Rubin Calamity, MD;  Location: [facility] OR;  Service: General;  Laterality: N/A;   History reviewed. No pertinent family history. Social History   Occupational History   Occupation: Retired - Music Therapist  Tobacco Use   Smoking status: Former   Smokeless tobacco: Never  Advertising Account Planner   Vaping status: Never Used  Substance and Sexual Activity   Alcohol use: Yes    Comment: social 1 or 2 beers a  week   Drug use: No   Sexual activity: Not Currently   Tobacco Counseling Counseling given: Not Answered  SDOH Screenings   Food Insecurity: No Food Insecurity (03/13/2024)  Housing: Unknown (03/13/2024)  Transportation Needs: No Transportation Needs (03/13/2024)  Utilities: Not At Risk (03/13/2024)  Alcohol Screen: Low Risk  (03/13/2024)  Depression (PHQ2-9): Low Risk  (03/13/2024)  Financial Resource Strain: Low Risk  (03/13/2024)  Physical Activity: Inactive (03/13/2024)  Social Connections: Moderately Isolated (03/13/2024)  Stress: No Stress Concern Present (03/13/2024)  Tobacco Use: Medium Risk (03/13/2024)  Health Literacy: Adequate Health Literacy (03/13/2024)   Depression Screen    03/13/2024    8:41 AM 03/08/2023    9:03 AM 09/22/2022    2:21 PM 07/21/2022    4:04 PM 05/08/2022   10:26 AM 02/05/2022   10:23 AM 08/05/2021    9:35 AM  PHQ 2/9 Scores  PHQ - 2 Score 0 0 0 0 0 0 0  PHQ- 9 Score 0 0           Data saved with a previous flowsheet row definition     Goals Addressed             This Visit's Progress    Patient Stated       03/13/2024, get neck and back straightened out       Visit info / Clinical Intake: Medicare Wellness Visit Type:: Subsequent Annual Wellness Visit Medicare Wellness Visit Mode:: Video If telephone or video:: unable to obtan vitals due to lack of equipment Interpreter Needed?: No Pre-visit prep was completed: yes AWV questionnaire completed by patient prior to visit?: no Living arrangements:: lives with spouse/significant other Patient's Overall Health Status Rating: good Typical amount of pain: some Does pain affect daily life?: no Are you currently prescribed opioids?: no  Dietary Habits and Nutritional Risks How many meals a day?: 3 Eats fruit and vegetables daily?: yes Most meals are obtained by: preparing own meals; eating out Diabetic:: no  Functional Status Activities of Daily Living (to include  ambulation/medication): Independent Ambulation: Independent Medication Administration: Independent Home Management: Independent Manage your own finances?: yes Primary transportation is: driving Concerns about vision?: no *vision screening is required for WTM* Concerns about hearing?: (!) yes Uses hearing aids?: (!) yes  Fall Screening Falls in the past year?: 0 Number of falls in past year: 0 Was there an injury with Fall?: 0 Fall Risk Category Calculator: 0 Patient Fall Risk Level: Low Fall Risk  Fall Risk Patient at Risk for Falls Due to: Medication side effect Fall risk Follow up: Falls evaluation completed; Falls prevention discussed  Home and Transportation Safety: All rugs have non-skid backing?: N/A, no rugs All stairs or steps have railings?: yes Grab bars in the bathtub or shower?: yes Have non-skid surface in bathtub or shower?: yes Good home lighting?: yes Regular seat belt use?: yes Hospital stays in the last year:: no  Cognitive Assessment Difficulty concentrating, remembering, or making decisions? :  no Will 6CIT or Mini Cog be Completed: yes What year is it?: 0 points What month is it?: 0 points Give patient an address phrase to remember (5 components): [address] About what time is it?: 0 points Count backwards from 20 to 1: 0 points Say the months of the year in reverse: 2 points Repeat the address phrase from earlier: 0 points 6 CIT Score: 2 points  Advance Directives (For Healthcare) Does Patient Have a Medical Advance Directive?: Yes Does patient want to make changes to medical advance directive?: No - Patient declined Type of Advance Directive: Healthcare Power of [place]; Living will Copy of Healthcare Power of Attorney in Chart?: Yes - validated most recent copy scanned in chart (See row information) Copy of Living Will in Chart?: Yes - validated most recent copy scanned in chart (See row information)        Objective:    Today's  Vitals   03/13/24 0838  Weight: 155 lb (70.3 kg)  Height: 5' 9 (1.753 m)   Body mass index is 22.89 kg/m.  Current Medications (verified) Outpatient Encounter Medications as of 03/13/2024  Medication Sig   dorzolamide -timolol  (COSOPT ) 22.3-6.8 MG/ML ophthalmic solution Place 1 drop into both eyes 2 (two) times daily.   doxazosin  (CARDURA ) 2 MG tablet TAKE 1 TABLET BY MOUTH DAILY   escitalopram  (LEXAPRO ) 20 MG tablet Take 1 tablet (20 mg total) by mouth daily.   fluticasone  (FLONASE ) 50 MCG/ACT nasal spray Place 1 spray into both nostrils daily as needed for allergies or rhinitis.   ipratropium (ATROVENT ) 0.03 % nasal spray SPRAY TWO SPRAYS INTO BOTH NOSTRILS EVERY 12 HOURS   latanoprost  (XALATAN ) 0.005 % ophthalmic solution Place 1 drop into both eyes at bedtime.   levothyroxine  (SYNTHROID ) 75 MCG tablet TAKE 1 TABLET BY MOUTH DAILY   losartan  (COZAAR ) 100 MG tablet TAKE 1 TABLET BY MOUTH DAILY   Nutritional Supplements (ENSURE ACTIVE PO) TAKE 3 TO 4 CARTONS ANY FLAVOR BY MOUTH DAILY   simvastatin  (ZOCOR ) 20 MG tablet TAKE ONE TABLET BY MOUTH EVERY EVENING   No facility-administered encounter medications on file as of 03/13/2024.   Hearing/Vision screen Hearing Screening - Comments:: Has hearing aids that are maintained Vision Screening - Comments:: Regular eye exams, Dr. Radionchenko Immunizations and Health Maintenance Health Maintenance  Topic Date Due   Medicare Annual Wellness (AWV)  03/13/2025   DTaP/Tdap/Td (3 - Td or Tdap) 05/08/2032   Pneumococcal Vaccine: 50+ Years  Completed   Influenza Vaccine  Completed   Zoster Vaccines- Shingrix  Completed   Meningococcal B Vaccine  Aged Out   COVID-19 Vaccine  Discontinued        Assessment/Plan:  This is a routine wellness examination for [place].  Patient Care Team: Berneta Elsie Sayre, MD as PCP - General (Family Medicine) Beverlee, [place] GAILS, MD as Referring Physician (Ophthalmology)  I have personally  reviewed and noted the following in the patient's chart:   Medical and social history Use of alcohol, tobacco or illicit drugs  Current medications and supplements including opioid prescriptions. Functional ability and status Nutritional status Physical activity Advanced directives List of other physicians Hospitalizations, surgeries, and ER visits in previous 12 months Vitals Screenings to include cognitive, depression, and falls Referrals and appointments  No orders of the defined types were placed in this encounter.  In addition, I have reviewed and discussed with patient certain preventive protocols, quality metrics, and best practice recommendations. A written personalized care plan for preventive services  as well as general preventive health recommendations were provided to patient.   Derek FORBES Dawn, LPN   [id number]/89/7974   Return in 1 year (on 03/13/2025).  After Visit Summary: (MyChart) Due to this being a telephonic visit, the after visit summary with patients personalized plan was offered to patient via MyChart   Nurse Notes: none

## 2024-03-16 ENCOUNTER — Other Ambulatory Visit: Payer: Self-pay | Admitting: Family Medicine

## 2024-03-16 DIAGNOSIS — Z85819 Personal history of malignant neoplasm of unspecified site of lip, oral cavity, and pharynx: Secondary | ICD-10-CM | POA: Diagnosis not present

## 2024-03-16 DIAGNOSIS — F411 Generalized anxiety disorder: Secondary | ICD-10-CM

## 2024-03-16 DIAGNOSIS — K1370 Unspecified lesions of oral mucosa: Secondary | ICD-10-CM | POA: Diagnosis not present

## 2024-03-19 ENCOUNTER — Other Ambulatory Visit: Payer: Self-pay | Admitting: Family Medicine

## 2024-03-19 DIAGNOSIS — I10 Essential (primary) hypertension: Secondary | ICD-10-CM

## 2024-04-03 ENCOUNTER — Other Ambulatory Visit: Payer: Self-pay | Admitting: Family Medicine

## 2024-04-03 DIAGNOSIS — E785 Hyperlipidemia, unspecified: Secondary | ICD-10-CM

## 2024-04-06 ENCOUNTER — Encounter (HOSPITAL_COMMUNITY): Payer: Self-pay | Admitting: General Surgery

## 2024-04-06 DIAGNOSIS — K1370 Unspecified lesions of oral mucosa: Secondary | ICD-10-CM | POA: Diagnosis not present

## 2024-04-14 DIAGNOSIS — C051 Malignant neoplasm of soft palate: Secondary | ICD-10-CM | POA: Diagnosis not present

## 2024-04-19 DIAGNOSIS — C059 Malignant neoplasm of palate, unspecified: Secondary | ICD-10-CM | POA: Diagnosis not present

## 2024-05-01 ENCOUNTER — Ambulatory Visit: Admitting: Physical Therapy

## 2024-05-08 ENCOUNTER — Ambulatory Visit: Admitting: Family Medicine

## 2024-05-08 ENCOUNTER — Encounter: Payer: Self-pay | Admitting: Family Medicine

## 2024-05-08 VITALS — BP 120/80 | HR 70 | Temp 97.5°F | Ht 69.0 in | Wt 156.0 lb

## 2024-05-08 DIAGNOSIS — D696 Thrombocytopenia, unspecified: Secondary | ICD-10-CM | POA: Diagnosis not present

## 2024-05-08 DIAGNOSIS — E876 Hypokalemia: Secondary | ICD-10-CM

## 2024-05-08 DIAGNOSIS — E039 Hypothyroidism, unspecified: Secondary | ICD-10-CM

## 2024-05-08 DIAGNOSIS — C140 Malignant neoplasm of pharynx, unspecified: Secondary | ICD-10-CM | POA: Diagnosis not present

## 2024-05-08 DIAGNOSIS — F411 Generalized anxiety disorder: Secondary | ICD-10-CM

## 2024-05-08 DIAGNOSIS — D649 Anemia, unspecified: Secondary | ICD-10-CM

## 2024-05-08 DIAGNOSIS — I10 Essential (primary) hypertension: Secondary | ICD-10-CM

## 2024-05-08 LAB — B12 AND FOLATE PANEL
Folate: >23.7 ng/mL
Vitamin B-12: 567 pg/mL (ref 211–911)

## 2024-05-08 LAB — CBC
HCT: 35.1 % — ABNORMAL LOW (ref 39.0–52.0)
Hemoglobin: 12.1 g/dL — ABNORMAL LOW (ref 13.0–17.0)
MCHC: 34.4 g/dL (ref 30.0–36.0)
MCV: 97.4 fl (ref 78.0–100.0)
Platelets: 113 K/uL — ABNORMAL LOW (ref 150.0–400.0)
RBC: 3.61 Mil/uL — ABNORMAL LOW (ref 4.22–5.81)
RDW: 13.7 % (ref 11.5–15.5)
WBC: 3.1 K/uL — ABNORMAL LOW (ref 4.0–10.5)

## 2024-05-08 LAB — BASIC METABOLIC PANEL WITH GFR
BUN: 16 mg/dL (ref 6–23)
CO2: 33 meq/L — ABNORMAL HIGH (ref 19–32)
Calcium: 9.8 mg/dL (ref 8.4–10.5)
Chloride: 103 meq/L (ref 96–112)
Creatinine, Ser: 0.99 mg/dL (ref 0.40–1.50)
GFR: 70.44 mL/min
Glucose, Bld: 125 mg/dL — ABNORMAL HIGH (ref 70–99)
Potassium: 3.2 meq/L — ABNORMAL LOW (ref 3.5–5.1)
Sodium: 141 meq/L (ref 135–145)

## 2024-05-08 LAB — TSH: TSH: 2.67 u[IU]/mL (ref 0.35–5.50)

## 2024-05-08 NOTE — Progress Notes (Addendum)
 "  Established Patient Office Visit   Subjective:  Patient ID: [name], male    DOB: 08-12-1940  Age: 84 y.o. MRN: [id number]  Chief Complaint  Patient presents with   Follow-up    Patient states throat cancer and is doing surgery next week.     HPI Encounter Diagnoses  Name Primary?   Essential hypertension Yes   Anxiety state    Normocytic anemia    Throat cancer (HCC)    Hypothyroidism, unspecified type    Hypokalemia    Thrombocytopenia    Follow-up of above.  Continues losartan  and Cardura  controlled blood pressure.  Continues levothyroxine  75 mcg daily each morning on a fasting stomach for hypothyroidism.  Continues Lexapro  for anxiety.  Scheduled for excision of a recurrent throat cancer next week at [facility].   Review of Systems  Constitutional: Negative.   HENT:  Positive for sore throat.   Eyes:  Negative for blurred vision, discharge and redness.  Respiratory: Negative.    Cardiovascular: Negative.   Gastrointestinal:  Negative for abdominal pain.  Genitourinary: Negative.   Musculoskeletal: Negative.  Negative for myalgias.  Skin:  Negative for rash.  Neurological:  Negative for tingling, loss of consciousness and weakness.  Endo/Heme/Allergies:  Negative for polydipsia.    Current Medications[1]   Objective:     BP 120/80   Pulse 70   Temp (!) 97.5 F (36.4 C) (Oral)   Ht 5' 9 (1.753 m)   Wt 156 lb (70.8 kg)   SpO2 98%   BMI 23.04 kg/m  BP Readings from Last 3 Encounters:  05/08/24 120/80  11/04/23 122/72  07/22/23 130/88   Wt Readings from Last 3 Encounters:  05/08/24 156 lb (70.8 kg)  03/13/24 155 lb (70.3 kg)  11/04/23 153 lb 3.2 oz (69.5 kg)      Physical Exam Constitutional:      General: He is not in acute distress.    Appearance: Normal appearance. He is not ill-appearing, toxic-appearing or diaphoretic.  HENT:     Head: Normocephalic and atraumatic.     Right Ear: External ear normal.     Left Ear: External  ear normal.     Mouth/Throat:     Mouth: Mucous membranes are moist.     Pharynx: Oropharynx is clear. No oropharyngeal exudate or posterior oropharyngeal erythema.   Eyes:     General: No scleral icterus.       Right eye: No discharge.        Left eye: No discharge.     Extraocular Movements: Extraocular movements intact.     Conjunctiva/sclera: Conjunctivae normal.     Pupils: Pupils are equal, round, and reactive to light.  Cardiovascular:     Rate and Rhythm: Normal rate and regular rhythm.  Pulmonary:     Effort: Pulmonary effort is normal. No respiratory distress.     Breath sounds: Normal breath sounds. No wheezing or rales.  Abdominal:     General: Bowel sounds are normal.  Musculoskeletal:     Cervical back: No rigidity or tenderness.  Skin:    General: Skin is warm and dry.  Neurological:     Mental Status: He is alert and oriented to person, place, and time.  Psychiatric:        Mood and Affect: Mood normal.        Behavior: Behavior normal.      Results for orders placed or performed in visit on 05/08/24  Basic metabolic panel  with GFR  Result Value Ref Range   Sodium 141 135 - 145 mEq/L   Potassium 3.2 (L) 3.5 - 5.1 mEq/L   Chloride 103 96 - 112 mEq/L   CO2 33 (H) 19 - 32 mEq/L   Glucose, Bld 125 (H) 70 - 99 mg/dL   BUN 16 6 - 23 mg/dL   Creatinine, Ser 9.00 0.40 - 1.50 mg/dL   GFR 29.55 >39.99 mL/min   Calcium 9.8 8.4 - 10.5 mg/dL  CBC  Result Value Ref Range   WBC 3.1 (L) 4.0 - 10.5 K/uL   RBC 3.61 (L) 4.22 - 5.81 Mil/uL   Platelets 113.0 (L) 150.0 - 400.0 K/uL   Hemoglobin 12.1 (L) 13.0 - 17.0 g/dL   HCT 64.8 (L) 60.9 - 47.9 %   MCV 97.4 78.0 - 100.0 fl   MCHC 34.4 30.0 - 36.0 g/dL   RDW 86.2 88.4 - 84.4 %  TSH  Result Value Ref Range   TSH 2.67 0.35 - 5.50 uIU/mL  Iron, TIBC and Ferritin Panel  Result Value Ref Range   Iron 79 50 - 180 mcg/dL   TIBC 742 749 - 574 mcg/dL (calc)   %SAT 31 20 - 48 % (calc)   Ferritin 96 24 - 380 ng/mL  B12  and Folate Panel  Result Value Ref Range   Vitamin B-12 567 211 - 911 pg/mL   Folate >23.7 >5.9 ng/mL      The ASCVD Risk score (Arnett DK, et al., 2019) failed to calculate for the following reasons:   The 2019 ASCVD risk score is only valid for ages 23 to 65   * - Cholesterol units were assumed    Assessment & Plan:   Essential hypertension -     Basic metabolic panel with GFR -     CBC -     Urinalysis, Routine w reflex microscopic  Anxiety state  Normocytic anemia -     CBC -     Iron, TIBC and Ferritin Panel -     B12 and Folate Panel -     Ambulatory referral to Hematology / Oncology  Throat cancer [facility])  Hypothyroidism, unspecified type -     TSH  Hypokalemia -     Potassium; Future  Thrombocytopenia -     Ambulatory referral to Hematology / Oncology    Return in about 3 months (around 08/06/2024), or if symptoms worsen or fail to improve.  Continue all medications as above.  Continue to monitor neutropenia and thrombocytopenia.  He had asked about abnormalities.  Will discuss at next visit.  Stable normocytic anemia and thrombocytopenia.  Question anemia of chronic disease.  Versus possible fibrodysplasia.  [name], MD  1/8 addendum: Labs revealed hypokalemia.  Discussed with patient.  Patient is on no potassium sparing medications.  Suspect lab error.  He will return for a repeat potassium next week.     [1]  Current Outpatient Medications:    dorzolamide -timolol  (COSOPT ) 22.3-6.8 MG/ML ophthalmic solution, Place 1 drop into both eyes 2 (two) times daily., Disp: , Rfl:    doxazosin  (CARDURA ) 2 MG tablet, TAKE 1 TABLET BY MOUTH DAILY, Disp: 90 tablet, Rfl: 1   escitalopram  (LEXAPRO ) 20 MG tablet, TAKE 1 TABLET BY MOUTH EVERY DAY, Disp: 180 tablet, Rfl: 1   fluticasone  (FLONASE ) 50 MCG/ACT nasal spray, Place 1 spray into both nostrils daily as needed for allergies or rhinitis., Disp: , Rfl:    ipratropium (ATROVENT ) 0.03 % nasal spray,  SPRAY  TWO SPRAYS INTO BOTH NOSTRILS EVERY 12 HOURS, Disp: 30 mL, Rfl: 3   latanoprost  (XALATAN ) 0.005 % ophthalmic solution, Place 1 drop into both eyes at bedtime., Disp: , Rfl:    levothyroxine  (SYNTHROID ) 75 MCG tablet, TAKE 1 TABLET BY MOUTH DAILY, Disp: 90 tablet, Rfl: 3   losartan  (COZAAR ) 100 MG tablet, TAKE 1 TABLET BY MOUTH DAILY, Disp: 90 tablet, Rfl: 3   Nutritional Supplements (ENSURE ACTIVE PO), TAKE 3 TO 4 CARTONS ANY FLAVOR BY MOUTH DAILY, Disp: , Rfl:    simvastatin  (ZOCOR ) 20 MG tablet, TAKE ONE TABLET BY MOUTH EVERY EVENING, Disp: 90 tablet, Rfl: 3  "

## 2024-05-09 LAB — IRON,TIBC AND FERRITIN PANEL
%SAT: 31 % (ref 20–48)
Ferritin: 96 ng/mL (ref 24–380)
Iron: 79 ug/dL (ref 50–180)
TIBC: 257 ug/dL (ref 250–425)

## 2024-05-11 ENCOUNTER — Ambulatory Visit: Payer: Self-pay | Admitting: Family Medicine

## 2024-05-11 ENCOUNTER — Encounter: Payer: Self-pay | Admitting: Family Medicine

## 2024-05-11 NOTE — Addendum Note (Signed)
 Addended by: BERNETA FALLOW A on: 05/11/2024 10:06 AM   Modules accepted: Orders

## 2024-06-13 ENCOUNTER — Inpatient Hospital Stay

## 2024-06-13 ENCOUNTER — Inpatient Hospital Stay: Admitting: Hematology

## 2024-08-07 ENCOUNTER — Ambulatory Visit: Admitting: Family Medicine

## 2025-03-19 ENCOUNTER — Ambulatory Visit
